# Patient Record
Sex: Male | Born: 1935 | Race: Black or African American | Hispanic: No | Smoking: Former smoker
Health system: Southern US, Community
[De-identification: ages and names within clinical notes are randomized; demographics above are authoritative.]

## PROBLEM LIST (undated history)

## (undated) ENCOUNTER — Emergency Department (HOSPITAL_COMMUNITY): Discharge status: Absent without leave | Payer: Medicare HMO

## (undated) DIAGNOSIS — I4891 Unspecified atrial fibrillation: Secondary | ICD-10-CM

## (undated) DIAGNOSIS — Z9289 Personal history of other medical treatment: Secondary | ICD-10-CM

## (undated) DIAGNOSIS — I2510 Atherosclerotic heart disease of native coronary artery without angina pectoris: Secondary | ICD-10-CM

## (undated) DIAGNOSIS — E119 Type 2 diabetes mellitus without complications: Secondary | ICD-10-CM

## (undated) DIAGNOSIS — I10 Essential (primary) hypertension: Secondary | ICD-10-CM

## (undated) DIAGNOSIS — K56609 Unspecified intestinal obstruction, unspecified as to partial versus complete obstruction: Secondary | ICD-10-CM

## (undated) DIAGNOSIS — Y92009 Unspecified place in unspecified non-institutional (private) residence as the place of occurrence of the external cause: Secondary | ICD-10-CM

## (undated) DIAGNOSIS — W19XXXA Unspecified fall, initial encounter: Secondary | ICD-10-CM

## (undated) DIAGNOSIS — E785 Hyperlipidemia, unspecified: Secondary | ICD-10-CM

## (undated) DIAGNOSIS — I739 Peripheral vascular disease, unspecified: Secondary | ICD-10-CM

## (undated) DIAGNOSIS — IMO0002 Reserved for concepts with insufficient information to code with codable children: Secondary | ICD-10-CM

## (undated) DIAGNOSIS — K279 Peptic ulcer, site unspecified, unspecified as acute or chronic, without hemorrhage or perforation: Secondary | ICD-10-CM

## (undated) DIAGNOSIS — I219 Acute myocardial infarction, unspecified: Secondary | ICD-10-CM

## (undated) HISTORY — DX: Unspecified atrial fibrillation: I48.91

## (undated) HISTORY — DX: Personal history of other medical treatment: Z92.89

## (undated) HISTORY — DX: Peripheral vascular disease, unspecified: I73.9

## (undated) HISTORY — DX: Reserved for concepts with insufficient information to code with codable children: IMO0002

## (undated) HISTORY — DX: Peptic ulcer, site unspecified, unspecified as acute or chronic, without hemorrhage or perforation: K27.9

## (undated) HISTORY — DX: Acute myocardial infarction, unspecified: I21.9

## (undated) HISTORY — PX: ILIAC ARTERY ANEURYSM REPAIR: SUR1158

## (undated) HISTORY — DX: Essential (primary) hypertension: I10

## (undated) HISTORY — DX: Hyperlipidemia, unspecified: E78.5

## (undated) HISTORY — PX: GASTRECTOMY: SHX58

## (undated) HISTORY — DX: Type 2 diabetes mellitus without complications: E11.9

## (undated) HISTORY — PX: JOINT REPLACEMENT: SHX530

## (undated) HISTORY — PX: EYE SURGERY: SHX253

## (undated) HISTORY — DX: Atherosclerotic heart disease of native coronary artery without angina pectoris: I25.10

## (undated) HISTORY — DX: Unspecified intestinal obstruction, unspecified as to partial versus complete obstruction: K56.609

---

## 1985-10-25 HISTORY — PX: CARDIAC CATHETERIZATION: SHX172

## 2002-06-06 ENCOUNTER — Encounter: Payer: Self-pay | Admitting: Internal Medicine

## 2002-06-06 ENCOUNTER — Ambulatory Visit (HOSPITAL_COMMUNITY): Admission: RE | Admit: 2002-06-06 | Discharge: 2002-06-06 | Payer: Self-pay | Admitting: Internal Medicine

## 2003-03-12 ENCOUNTER — Encounter: Payer: Self-pay | Admitting: Vascular Surgery

## 2003-03-12 ENCOUNTER — Encounter: Admission: RE | Admit: 2003-03-12 | Discharge: 2003-03-12 | Payer: Self-pay | Admitting: Vascular Surgery

## 2003-03-26 HISTORY — PX: CORONARY ANGIOPLASTY WITH STENT PLACEMENT: SHX49

## 2003-03-29 ENCOUNTER — Encounter: Payer: Self-pay | Admitting: Cardiology

## 2003-03-29 ENCOUNTER — Ambulatory Visit (HOSPITAL_COMMUNITY): Admission: RE | Admit: 2003-03-29 | Discharge: 2003-03-30 | Payer: Self-pay | Admitting: Cardiology

## 2003-05-14 ENCOUNTER — Encounter: Payer: Self-pay | Admitting: Orthopedic Surgery

## 2003-05-22 ENCOUNTER — Encounter: Payer: Self-pay | Admitting: Orthopedic Surgery

## 2003-05-22 ENCOUNTER — Inpatient Hospital Stay (HOSPITAL_COMMUNITY): Admission: RE | Admit: 2003-05-22 | Discharge: 2003-05-26 | Payer: Self-pay | Admitting: Orthopedic Surgery

## 2004-02-25 ENCOUNTER — Ambulatory Visit (HOSPITAL_COMMUNITY): Admission: RE | Admit: 2004-02-25 | Discharge: 2004-02-25 | Payer: Self-pay | Admitting: Internal Medicine

## 2004-03-17 ENCOUNTER — Encounter: Admission: RE | Admit: 2004-03-17 | Discharge: 2004-03-17 | Payer: Self-pay | Admitting: Vascular Surgery

## 2004-09-07 ENCOUNTER — Ambulatory Visit: Payer: Self-pay | Admitting: Family Medicine

## 2004-09-22 ENCOUNTER — Ambulatory Visit (HOSPITAL_COMMUNITY): Admission: RE | Admit: 2004-09-22 | Discharge: 2004-09-22 | Payer: Self-pay | Admitting: Gastroenterology

## 2004-10-14 ENCOUNTER — Ambulatory Visit (HOSPITAL_COMMUNITY): Admission: RE | Admit: 2004-10-14 | Discharge: 2004-10-14 | Payer: Self-pay | Admitting: Specialist

## 2004-12-29 ENCOUNTER — Emergency Department (HOSPITAL_COMMUNITY): Admission: EM | Admit: 2004-12-29 | Discharge: 2004-12-29 | Payer: Self-pay | Admitting: Emergency Medicine

## 2005-01-18 ENCOUNTER — Ambulatory Visit: Payer: Self-pay | Admitting: Family Medicine

## 2005-04-06 ENCOUNTER — Encounter: Admission: RE | Admit: 2005-04-06 | Discharge: 2005-04-06 | Payer: Self-pay | Admitting: Vascular Surgery

## 2005-04-14 ENCOUNTER — Ambulatory Visit: Payer: Self-pay | Admitting: Family Medicine

## 2005-04-19 ENCOUNTER — Ambulatory Visit: Payer: Self-pay | Admitting: Family Medicine

## 2005-09-09 ENCOUNTER — Inpatient Hospital Stay (HOSPITAL_COMMUNITY): Admission: EM | Admit: 2005-09-09 | Discharge: 2005-09-14 | Payer: Self-pay | Admitting: Emergency Medicine

## 2005-09-20 ENCOUNTER — Ambulatory Visit: Payer: Self-pay | Admitting: Family Medicine

## 2006-02-11 ENCOUNTER — Ambulatory Visit: Payer: Self-pay | Admitting: Family Medicine

## 2006-04-05 ENCOUNTER — Encounter: Admission: RE | Admit: 2006-04-05 | Discharge: 2006-04-05 | Payer: Self-pay | Admitting: Vascular Surgery

## 2006-04-11 ENCOUNTER — Ambulatory Visit: Payer: Self-pay | Admitting: Internal Medicine

## 2006-09-07 ENCOUNTER — Ambulatory Visit: Payer: Self-pay | Admitting: Internal Medicine

## 2007-01-23 ENCOUNTER — Ambulatory Visit: Payer: Self-pay | Admitting: Internal Medicine

## 2007-04-09 ENCOUNTER — Emergency Department (HOSPITAL_COMMUNITY): Admission: EM | Admit: 2007-04-09 | Discharge: 2007-04-10 | Payer: Self-pay | Admitting: Emergency Medicine

## 2007-04-11 ENCOUNTER — Encounter: Admission: RE | Admit: 2007-04-11 | Discharge: 2007-04-11 | Payer: Self-pay | Admitting: Vascular Surgery

## 2007-04-11 ENCOUNTER — Ambulatory Visit: Payer: Self-pay | Admitting: Vascular Surgery

## 2007-10-02 ENCOUNTER — Encounter (INDEPENDENT_AMBULATORY_CARE_PROVIDER_SITE_OTHER): Payer: Self-pay | Admitting: Family Medicine

## 2007-10-02 ENCOUNTER — Ambulatory Visit: Payer: Self-pay | Admitting: Internal Medicine

## 2007-10-02 LAB — CONVERTED CEMR LAB
AST: 17 units/L (ref 0–37)
Albumin: 4.6 g/dL (ref 3.5–5.2)
BUN: 11 mg/dL (ref 6–23)
Basophils Relative: 1 % (ref 0–1)
Calcium: 9.7 mg/dL (ref 8.4–10.5)
Chloride: 103 meq/L (ref 96–112)
HDL: 62 mg/dL (ref >39)
Lymphocytes Relative: 33 % (ref 12–46)
Lymphs Abs: 1.8 10*3/uL (ref 0.7–4.0)
MCHC: 33.2 g/dL (ref 30.0–36.0)
Monocytes Relative: 9 % (ref 3–12)
Neutro Abs: 3.1 10*3/uL (ref 1.7–7.7)
Neutrophils Relative %: 56 % (ref 43–77)
PSA: 0.57 ng/mL (ref 0.10–4.00)
Potassium: 4.8 meq/L (ref 3.5–5.3)
RBC: 4.81 M/uL (ref 4.22–5.81)
WBC: 5.6 10*3/uL (ref 4.0–10.5)

## 2007-11-01 ENCOUNTER — Encounter: Admission: RE | Admit: 2007-11-01 | Discharge: 2008-01-30 | Payer: Self-pay | Admitting: Family Medicine

## 2008-01-16 ENCOUNTER — Encounter: Admission: RE | Admit: 2008-01-16 | Discharge: 2008-01-16 | Payer: Self-pay | Admitting: Vascular Surgery

## 2008-01-16 ENCOUNTER — Ambulatory Visit: Payer: Self-pay | Admitting: Vascular Surgery

## 2008-01-30 ENCOUNTER — Ambulatory Visit: Payer: Self-pay | Admitting: Vascular Surgery

## 2008-02-20 ENCOUNTER — Ambulatory Visit: Payer: Self-pay | Admitting: Surgery

## 2008-02-20 ENCOUNTER — Ambulatory Visit (HOSPITAL_COMMUNITY): Admission: RE | Admit: 2008-02-20 | Discharge: 2008-02-20 | Payer: Self-pay | Admitting: Surgery

## 2008-02-22 ENCOUNTER — Inpatient Hospital Stay (HOSPITAL_COMMUNITY): Admission: RE | Admit: 2008-02-22 | Discharge: 2008-02-25 | Payer: Self-pay | Admitting: Vascular Surgery

## 2008-03-26 ENCOUNTER — Encounter: Admission: RE | Admit: 2008-03-26 | Discharge: 2008-03-26 | Payer: Self-pay | Admitting: Vascular Surgery

## 2008-03-26 ENCOUNTER — Ambulatory Visit: Payer: Self-pay | Admitting: Vascular Surgery

## 2008-05-20 ENCOUNTER — Ambulatory Visit: Payer: Self-pay | Admitting: *Deleted

## 2008-10-08 ENCOUNTER — Ambulatory Visit: Payer: Self-pay | Admitting: Vascular Surgery

## 2008-10-08 ENCOUNTER — Encounter: Admission: RE | Admit: 2008-10-08 | Discharge: 2008-10-08 | Payer: Self-pay | Admitting: Vascular Surgery

## 2008-12-02 ENCOUNTER — Emergency Department (HOSPITAL_COMMUNITY): Admission: EM | Admit: 2008-12-02 | Discharge: 2008-12-02 | Payer: Self-pay | Admitting: Emergency Medicine

## 2008-12-04 ENCOUNTER — Emergency Department (HOSPITAL_COMMUNITY): Admission: EM | Admit: 2008-12-04 | Discharge: 2008-12-04 | Payer: Self-pay | Admitting: Family Medicine

## 2009-04-18 ENCOUNTER — Ambulatory Visit: Payer: Self-pay | Admitting: Vascular Surgery

## 2009-11-25 HISTORY — PX: TRANSTHORACIC ECHOCARDIOGRAM: SHX275

## 2010-05-12 ENCOUNTER — Ambulatory Visit: Payer: Self-pay | Admitting: Vascular Surgery

## 2011-02-09 LAB — DIFFERENTIAL
Eosinophils Relative: 3 % (ref 0–5)
Lymphocytes Relative: 20 % (ref 12–46)
Lymphs Abs: 0.9 10*3/uL (ref 0.7–4.0)
Monocytes Absolute: 0.5 10*3/uL (ref 0.1–1.0)

## 2011-02-09 LAB — CBC
HCT: 32.9 % — ABNORMAL LOW (ref 39.0–52.0)
Hemoglobin: 10.8 g/dL — ABNORMAL LOW (ref 13.0–17.0)
RDW: 15.4 % (ref 11.5–15.5)
WBC: 4.3 10*3/uL (ref 4.0–10.5)

## 2011-03-09 NOTE — Procedures (Signed)
ENDOVASCULAR STENT GRAFT EXAM   INDICATION:  Followup evaluation of endovascular stent.   HISTORY:  Endovascular aneurysm repair on 02/22/2008 by Dr. Myra Gianotti.                           DUPLEX EVALUATION   AAA Sac Size:                 2.75 CM AP            2.60 CM TRV  Previous Sac Size:            CM AP                 CM TRV  Evidence of an endoleak?      No                    No   Velocity Criteria:  Proximal Aorta                72 cm/sec  Proximal Stent Graft          61 cm/sec  Main Body Stent Graft-Mid     51 cm/sec  Right Limb-Proximal           135 cm/sec  Right Limb-Distal             152 cm/sec  Left Limb-Proximal            140 cm/sec  Left Limb-Distal              68 cm/sec  Patent Renal Arteries?        Yes                   Yes   IMPRESSION:  1. Patent endovascular stent with no evidence of stenosis or endoleak.  2. Biphasic duplex waveform noted within stent and native arteries.  3. Normal bilateral lower extremity ABIs with triphasic and biphasic      Doppler waveforms.   ___________________________________________  Quita Skye Hart [place], M.D.   AC/MEDQ  D:  04/18/2009  T:  04/18/2009  Job:  [id number]

## 2011-03-09 NOTE — Assessment & Plan Note (Signed)
OFFICE VISIT   TAJAI, IHDE  DOB:  1932-03-08                                       03/26/2008  TDDUK#:[id number]   The patient returns 4 weeks post insertion of an aortobi-iliac stent  graft to treat the right common iliac artery aneurysm.  He had coil  embolization of the right internal iliac artery preoperatively by Dr.  Myra Gianotti.  Cheree Ditto excluder graft was utilized.  The patient complains of  no discomfort in the inguinal areas or in the abdomen and has resumed  his normal activities with a good appetite.   PHYSICAL EXAMINATION:  Vital signs:  Blood pressure 110/70, heart rate  60, respirations 14.  Abdominal:  Exam is unremarkable with no pulsatile  mass noted.  Incisions in the inguinal areas are well-healed with 3+  femoral pulses and well-perfused lower extremities.   CT angiogram was performed today which I reviewed.  There was no  evidence of any endoleak or migration of the graft and the right common  iliac artery aneurysm is thrombosed with patency of both limbs of the  graft.  He was reassured regarding these findings and will return in 6  months for a followup CT angiogram and then to see me.   Quita Skye Hart [place], M.D.  Electronically Signed   JDL/MEDQ  D:  03/26/2008  T:  03/27/2008  Job:  [id number]

## 2011-03-09 NOTE — Procedures (Signed)
LOWER EXTREMITY ARTERIAL EVALUATION-SINGLE LEVEL   INDICATION:  Preop evaluation of AAA repair.   HISTORY:  Diabetes:  No.  Cardiac:  History of MI, cardiac stent.  Hypertension:  Yes.  Smoking:  No.  Previous Surgery:  Cardiac.   RESTING SYSTOLIC PRESSURES: (ABI)                          RIGHT                LEFT  Brachial:               128                  128  Anterior tibial:        134 (>1.0)           130 (>1.0)  Posterior tibial:       140                  130  Peroneal:  DOPPLER WAVEFORM ANALYSIS:  Anterior tibial:        Biphasic             Triphasic  Posterior tibial:       Triphasic            Triphasic  Peroneal:   PREVIOUS ABI'S:  Date:  RIGHT:  LEFT:   IMPRESSION:  Bilateral ankle brachial indices within normal limits.   ___________________________________________  Quita Skye Hart [place], M.D.   PB/MEDQ  D:  01/30/2008  T:  01/30/2008  Job:  [id number]

## 2011-03-09 NOTE — Assessment & Plan Note (Signed)
OFFICE VISIT   RHYLEN, SHAHEEN  DOB:  15-Nov-1931                                       01/30/2008  ZOXWR#:[id number]   The patient returns today to discuss options to treat his 4 cm right  iliac aneurysm.  I reviewed his CT angiogram with Dr. Myra Gianotti and with  Trena Platt from [place] [facility] and we feel that the best option for this is  insertion of an aortobi-iliac stent graft.  He does have a large right  common iliac aneurysm measuring 4 cm in diameter which extends down to  the origin of the internal iliac which will need embolization.  I  discussed this with him and his wife today and explained the procedure  in detail.   On examination today his blood pressure is 135/80, heart rate 55,  respirations 16.  His abdomen is soft and nontender with a pulsatile  mass in the right lower quadrant.  He has excellent femoral pulses  bilaterally.   We will proceed with embolization of his right internal iliac artery by  Dr. Myra Gianotti on April 28 with admission to the hospital that day and  insertion of the aortobi-iliac stent graft on Wednesday April 29.  He  will obtain a Cardiolite study preoperatively at [facility].  He underwent PTCA and stenting by Dr. Elsie Lincoln many years ago  but is asymptomatic.   Quita Skye Hart [place], M.D.  Electronically Signed   JDL/MEDQ  D:  01/30/2008  T:  01/31/2008  Job:  [id number]

## 2011-03-09 NOTE — Op Note (Signed)
[name], Robert                 ACCOUNT NO.:  [id number]   MEDICAL RECORD NO.:  [id number]          PATIENT TYPE:  INP   LOCATION:  2007                         FACILITY:  [facility]   PHYSICIAN:  Juleen China IV, MDDATE OF BIRTH:  15-Jun-1932   DATE OF PROCEDURE:  02/22/2008  DATE OF DISCHARGE:  02/20/2008                               OPERATIVE REPORT   PREOPERATIVE DIAGNOSIS:  Right common iliac aneurysm.   SURGEON:  1. Durene Cal IV, MD   PROCEDURE PERFORMED:  Endovascular aneurysm repair.   This is a dictation for the left common femoral artery exposure.  Please  see Dr. Candie Chroman note for full details of the procedure.   TYPE OF ANESTHESIA:  General.   ESTIMATED BLOOD LOSS:  150 milliliters.   COMPLICATIONS:  None.   PROCEDURE:  The patient was identified in the holding area and taken to  room #9, and he was placed supine on the table.  General endotracheal  anesthesia was administered.  The patient was prepped and draped in the  standard sterile fashion.  A time-out was called and antibiotics were  given.   The inguinal ligament was identified by bony landmarks.  An oblique  incision was made below the inguinal ligament above the palpable pulse  of the common femoral artery.  Bovie cautery was used to dissect the  subcutaneous tissue.  Weitlaner retractors were used for exposure.  The  femoral sheath was identified and opened sharply.  Common femoral artery  was then mobilized proximally and distally.  Vesseloops were then  placed.   Once the procedure had been completed, sheaths and wires were removed.  The common femoral artery was flushed in antegrade and retrograde  fashion.  The arteriotomy was then closed with running 5-0 Prolene.  Doppler was used to evaluate the artery and had good signals.   After irrigation, the femoral sheath was closed with a running 2-0  Vicryl.  Subcutaneous tissue was closed in two additional layers of 2-0  Vicryl, and the skin  was closed with 4-0 Vicryl.           ______________________________  V. Charlena Cross, MD  Electronically Signed    VWB/MEDQ  D:  02/23/2008  T:  02/24/2008  Job:  [id number]

## 2011-03-09 NOTE — Assessment & Plan Note (Signed)
OFFICE VISIT   Robert, [name]  DOB:  07/19/1932                                       10/08/2008  VFIEP#:[id number]   The patient returns today for further followup regarding his stent graft  which I inserted in April of 2009 for a large right iliac aneurysm.  CT  angiogram was performed today prior to me seeing him which I have  reviewed.  This reveals that the stent graft is in excellent position  and the thrombosed right common iliac artery aneurysm is stable.  There  is no evidence of any endoleak or migration of the graft on scan today.  He denies any abdominal or back symptoms.  He is able to ambulate about  1 mile without claudication symptoms.  He has had no chest pain, dyspnea  on exertion, PND or orthopnea.  He denies any neurologic symptoms such  as hemiparesis, aphasia, amaurosis fugax, diplopia, blurred vision or  syncope.  He is not taking aspirin because he is allergic to it.   PHYSICAL EXAMINATION:  Blood pressure is 125/78, heart rate is 57,  respirations are 18.  Carotid pulses 3+ and no bruits.  Chest clear to  auscultation.  Abdomen soft, nontender with no pulsatile mass.  He has  3+ femoral, popliteal and dorsalis pedis pulses palpable bilaterally.   I am pleased with the results of his scan and his progress.  We will see  him again in 6 months and obtain a duplex scan in our office for  continued followup of the aortic stent graft to treat the right iliac  aneurysm.  If he has any problems in the interim he will be in touch  with [place].   Quita Skye Hart [place], M.D.  Electronically Signed   JDL/MEDQ  D:  10/08/2008  T:  10/09/2008  Job:  [id number]   cc:   Thora Lance, M.D.  Courtney Paris, M.D.

## 2011-03-09 NOTE — Op Note (Signed)
NAMEJLON, BETKER                 ACCOUNT NO.:  [id number]   MEDICAL RECORD NO.:  [id number]          PATIENT TYPE:  AMB   LOCATION:  SDS                          FACILITY:  [facility]   PHYSICIAN:  Juleen China IV, MDDATE OF BIRTH:  May 26, 1932   DATE OF PROCEDURE:  02/20/2008  DATE OF DISCHARGE:  02/20/2008                               OPERATIVE REPORT   PREOPERATIVE DIAGNOSIS:  Right common iliac aneurysm.   POSTOPERATIVE DIAGNOSIS:  Right common iliac aneurysm.   PROCEDURE PERFORMED:  1. Ultrasound-guided access to the left common femoral artery.  2. Abdominal aortogram.  3. Second-order catheterization.  4. Coil embolization of right hypogastric artery.  5. Conscious sedation from 11:42 to 12:28.   PROCEDURE:  The patient was identified in the holding area and taken to  the room 8, and he was placed supine on the table.  Bilateral groins  were prepped and draped in a standard sterile fashion.  A timeout was  called.  The left common femoral artery was evaluated with ultrasound  and found to be widely patent.  Lidocaine 1% was used for local  anesthesia.  Using ultrasound, the left common femoral artery was  accessed under ultrasound guidance.  A 0.035 wire was advanced in the  retrograde fashion into the abdominal aorta under fluoroscopic  visualization.  A 5-French sheath was then placed over the wire, and a  marked pigtail catheter was placed at the L1 and abdominal aortogram was  obtained.  Oblique images were then obtained of the iliac arterial  system.   FINDINGS:  Aortogram:  Single renal arteries were visualized which were  widely patent with minimal stenosis.  There is diffuse disease  throughout the intrarenal abdominal aorta.  Iliac and infrarenal  abdominal aorta are heavily calcified.  Left common iliac artery is  widely patent.  The left hypogastric artery is widely patent.  The left  external iliac artery is patent with mild disease.  The right common  iliac  artery was aneurysmal.  The right external iliac artery is patent.  The right hypogastric artery was patent.   Intervention:  Using a crossing catheter, the aortic bifurcation was  crossed.  A wire was placed into the right external iliac artery, and a  6-french Terumo sheath was then advanced over the bifurcation into the  right external iliac artery.  Using a Kumpe catheter, the sheath was  pulled back, and the right hypogastric artery was selectively  cannulated.  A contrast injection was performed to confirm successful  cannulation.  The tip of the catheter was placed into the hypogastric  artery.  Next, two 10 x 14 Nester coils were placed in the right  hypogastric artery.  A followup angiogram was performed which reveals  successful coil embolization of the right hypogastric artery.   The above results were obtained.  A decision was made to terminate the  procedure.  Catheters and wires were removed.  The patient will be taken  to the holding area for sheath pull.  The patient tolerated the  procedure.  There are no complications.  ______________________________  V. Charlena Cross, MD  Electronically Signed     VWB/MEDQ  D:  02/20/2008  T:  02/21/2008  Job:  [id number]

## 2011-03-09 NOTE — Op Note (Signed)
NAMEHULON, FERRON                 ACCOUNT NO.:  [id number]   MEDICAL RECORD NO.:  [id number]          PATIENT TYPE:  INP   LOCATION:  3307                         FACILITY:  [facility]   PHYSICIAN:  Quita Skye. Hart [place], M.D.  DATE OF BIRTH:  1932-09-05   DATE OF PROCEDURE:  02/22/2008  DATE OF DISCHARGE:                               OPERATIVE REPORT   PREOPERATIVE DIAGNOSIS:  Right common iliac artery aneurysm.   POSTOPERATIVE DIAGNOSIS:  Right common iliac artery aneurysm.   PROCEDURE:  1. Bilateral common femoral artery exposure (right side Dr. Hart Rochester,      left side Dr. Juleen China).  2. Insertion of an aorto to right external iliac and left common iliac      artery, excluder stent graft Emeline Darling) with;      a.     The main body 23 x 12 x 18 cm - right side.      b.     Contralateral limb is 20 x 11.5 cm - left side.   SURGEON:  Quita Skye. Hart [place], MD   FIRST ASSISTANT:  1. Durene Cal IV, MD   ANESTHESIA:  General endotracheal.   PROCEDURE:  The patient was taken to the operating room and placed in  the supine position, at which time satisfactory general endotracheal  anesthesia was administered.  Monitoring lines were placed by anesthesia  preoperatively.  Abdomen and both groins were prepped.  Betadine scrub  and solution, draped in routine sterile manner.  Oblique suprainguinal  incisions were made bilaterally and the common femoral arteries were  exposed and encircled with vessel loops.  They were slightly dilated but  not truly aneurysmal and had some mild occlusive disease.  Heparin 6000  units was given intravenously.  8-French sheaths were inserted  bilaterally over guidewires in the common femoral arteries.  Dr. Hart Rochester  exposed the right side, Dr. Durene Cal exposed the left side.  Pigtail catheter was positioned in the suprarenal aorta from the left  side, and the main body which was deployed through the right femoral  artery was positioned in the juxtarenal  aorta.  The main body was a 23 x  12 x 18 cm device - excluder [employer]).  An angiogram was then performed  through the pigtail catheter using 20 mL of contrast at 20 mL/sec.  The  renal arteries were marked appropriately and the device positioned just  distal to the renal arteries, device deployed.  Retrograde angiogram on  the right side was performed through the sheath.  The right internal  iliac artery had been embolized preoperatively with the aneurysm being  located in the right common iliac artery.  The right limb through the  main body extended well into the right external iliac artery and  retrograde iliac angiogram on the right side revealed this to be in good  position.  Following this, the contralateral gate was cannulated through  the left common femoral exposure without difficulty and the length of  the left limb was measured appropriately and bell bottom 20 mm - 11.5 cm  limb was deployed.  This extended down the common iliac artery on the  left which was mildly aneurysmal and terminated about 3 cm above the  origin of the left hypogastric.  All of the junctions were then gently  dilated with Coda balloon and completion angiogram was performed through  the pigtail catheter.  This revealed the aortobiiliac graft to be in  excellent position with no evidence of any Endo leaks.  Sheaths were all  removed and protamine given to reverse the heparin.  Prior to the  administration of protamine, both common femoral arteries were repaired  with continuous 6-0 Prolene sutures.  There was excellent Doppler flow.  Protamine  given to reverse the heparin following adequate hemostasis.  The wounds  were closed in layers with Vicryl in subcuticular fashion.  Sterile  dressing was applied.  The patient was taken to recovery room in  satisfactory condition.      Quita Skye Hart Rochester, M.D.  Electronically Signed     JDL/MEDQ  D:  02/22/2008  T:  02/23/2008  Job:  [id number]

## 2011-03-09 NOTE — H&P (Signed)
HISTORY AND PHYSICAL EXAMINATION   January 30, 2008   Re:  Robert [name], Robert [name]                   DOB:  Aug 09, 1932   CHIEF COMPLAINT:  Large right common iliac artery aneurysm.   HISTORY OF PRESENT ILLNESS:  This 77 year old male patient has had a  large right common iliac aneurysm followed by Dr. Hart Rochester since 2008.  This has increased in size and CT scanning recently revealed that the  right common iliac artery now measures 4 cm in diameter.  He has some  mild dilatation of his abdominal aorta and his left common iliac artery  but not truly aneurysmal.  After thorough preoperative evaluation he is  now scheduled for embolization of the right internal iliac artery to be  followed by insertion of an aortobi-iliac stent graft by Dr. Hart Rochester and  Dr. Myra Gianotti.   PAST MEDICAL HISTORY:  1. Hypertension.  2. Coronary artery disease with previous myocardial infarction many      years ago with recent Cardiolite study performed.  3. Hyperlipidemia.  4. Negative for diabetes, congestive heart failure, COPD.   PAST SURGICAL HISTORY:  1. Gastrectomy 50 years ago for ulcer disease - 2 operations.  2. Left hip replacement.  3. Cataract surgery, right eye.  4. Previous PTCA and stenting 20 years ago for coronary artery disease      - Dr. Elsie Lincoln.   FAMILY HISTORY:  Positive for CVA in his father, negative for coronary  artery disease or diabetes.   SOCIAL HISTORY:  He is widowed, disabled and not employed.  He smoked  about 1/2 pack of cigarettes per day until around 2000 when he quit.  Does not use alcohol on a regular basis.   REVIEW OF SYSTEMS:  Occasional dyspnea but no palpitations or PND or  orthopnea.  No weight loss or weight gain.  Negative for pulmonary  symptoms.  Acid reflux esophagitis.  No urinary or neurologic symptoms.   ALLERGIES:  None known.   MEDICATIONS:  1. Norvasc 5 mg one tablet daily.  2. Zocor 20 mg one tablet daily.  3. Fosamax one every week.  4.  Alphagan eye drops 2 times a day.   PHYSICAL EXAMINATION:  Vital signs:  Blood pressure is 135/80, heart  rate is 55, respirations 16.  General:  He is an elderly male in no  apparent distress.  Alert and oriented x3.  Neck:  His neck is supple.  3+ carotid pulses palpable.  No bruits are audible.  Neurological:  Normal.  No palpable adenopathy in the neck.  Upper extremities:  Pulses  3+ bilaterally.  Chest:  Clear to auscultation.  Cardiovascular:  Reveals a regular rhythm with no murmurs.  Abdomen:  Soft, nontender.  Pulsatile mass in the right lower quadrant approximating 4 cm.  He has  3+ femoral pulses bilaterally with well-perfused lower extremities.   IMPRESSION:  1. Large right common iliac artery aneurysm which has gradually      enlarged since 2003.  2. Coronary artery disease stable for preoperative clearance with      Cardiolite study.  3. Hypertension.  4. Hyperlipidemia.   PLAN:  Admit the patient on April 28 for embolization of his right  internal iliac artery by Dr. Durene Cal to be followed by insertion  of aortoiliac stent graft by Dr. Hart Rochester and Dr. Myra Gianotti on Wednesday,  April 29.  The risks and benefits have been thoroughly discussed with  the patient.  They would like to proceed.   Quita Skye Hart Rochester, M.D.  Electronically Signed   JDL/MEDQ  D:  01/30/2008  T:  01/31/2008  Job:  [id number]   cc:   Thora Lance, M.D.

## 2011-03-09 NOTE — Procedures (Signed)
ENDOVASCULAR STENT GRAFT EXAM   INDICATION:  Follow up endovascular stent.   HISTORY:  Endovascular aneurysm repair of the right common iliac artery aneurysm.                           DUPLEX EVALUATION   AAA Sac Size:                 CM AP                 CM TRV  Previous Sac Size:            CM AP                 CM TRV  Evidence of an endoleak?      No                    No   Velocity Criteria:  Proximal Aorta                50 cm/sec  Proximal Stent Graft          30 cm/sec  Main Body Stent Graft-Mid     83 cm/sec  Right Limb-Proximal           66 cm/sec  Right Limb-Distal             65 cm/sec  Left Limb-Proximal            43 cm/sec  Left Limb-Distal              39 cm/sec  Patent Renal Arteries?        Yes                   Yes   IMPRESSION:  1. Patent endovascular stent with no evidence of stenosis or leak,      based on limited visualization due to overlying bowel gas patterns.  2. Maximum diameter of the visualization right common iliac artery was      2.0 X 2.0 cm.  3. Normal ankle brachial indices of 0.99 on the right and 1.02 on the      left.  4. Incidental finding:  There is a large cystic structure measuring      7.5 X 5.2 X 6.5 cm noted near the left kidney with no internal flow      noted.   ___________________________________________  Quita Skye Hart Rochester, M.D.   CH/MEDQ  D:  05/13/2010  T:  05/13/2010  Job:  [id number]

## 2011-03-09 NOTE — Assessment & Plan Note (Signed)
OFFICE VISIT   BENZ, VANDENBERGHE  DOB:  1932-02-03                                       01/16/2008  QIONG#:[id number]   The patient returns today for further followup of his right iliac artery  aneurysm, which we have been following since 2002.  Aneurysm has  gradually enlarged from around 3 cm in diameter to close to 4 cm in  diameter today (3.9.  This is a slight enlargement over last year's  study.  He does not have significant abdominal aortic aneurysm, but does  have some mild aneurysm and dilatation of his left common iliac artery.  He has had no abdominal or back symptoms, and does not have any cardiac  symptoms, such as chest pain, dyspnea on exertion, PND, or orthopnea.   EXAMINATION:  His abdomen is soft and nontender with a pulsatile mass in  the right lower quadrant, approximating 4 cm.  He has 3+ femoral pulses  bilaterally with well-perfused lower extremities.  Upper extremity  pulses are 3+ bilaterally.  His carotid pulses are 3+ with no bruits.  Chest is clear to auscultation.  Cardiovascular exam reveals a regular  rate and rhythm without murmurs.   He had a CT angiogram today with pre-stent graft measurements.  Maximum  diameter of the right iliac artery is 39 mm.  The diameter of the right  common iliac artery at the bifurcation is approximately 18 mm and there  may be as much as 15 mm of length above the aneurysm to seed a stent  graft.  This would also require embolization of internal iliac artery.  I discussed this possibility with the patient and the fact that the  aneurysm is slowly enlarging.  At this point, we will continue to follow  it for now.  I will see him back in 9 months with another CT angiogram  and will discuss this further with Dr. Myra Gianotti to get his opinion  regarding possible stent grafting of this iliac aneurysm.  If the  patient has any symptoms, he is to be in touch with [place].   Quita Skye Hart Rochester, M.D.  Electronically  Signed   JDL/MEDQ  D:  01/16/2008  T:  01/17/2008  Job:  [id number]

## 2011-03-09 NOTE — Assessment & Plan Note (Signed)
OFFICE VISIT   [name], [name]  DOB:  14-Apr-1932                                       04/11/2007  ZOXWR#:[id number]   Robert [name] returns for further follow up of his right iliac aneurysm  which we have been following since 2002.  The aneurysm initially  measured about 3 cm in diameter and has slowly enlarged and a CT scan  today was reviewed by me and currently measures 3.6 cm in diameter.  He  has no significant abdominal aortic aneurysm but does have some mild  aneurysmal dilatation of his left common iliac artery in the 2 cm range.  He is not having any abdominal or back symptoms at the present time.  He  denies any cardiac symptoms currently, although he did have an episode a  few days ago of some strange left anterior chest discomfort and was  evaluated in the emergency room and these symptoms were not felt to be  cardiac in nature.  He has had a previous PTCA and stent done by Dr.  Elsie Lincoln in the past.  Denies any neurologic symptoms as well including  hemiparesis, aphasia, amaurosis fugax, blurred vision or syncope.   PHYSICAL EXAMINATION:  Blood pressure 126/73.  Heart rate is 89.  Respirations 16.  His carotid pulse is 3+.  No audible bruits.  Abdomen  is soft, nontender with no masses.  He has 3+ femoral and popliteal  pulses bilaterally with well perfused lower extremities.  There is a  pulsatile mass in the right lower quadrant approximating 3.5 to 4 cm in  diameter, which is nontender.   I have discussed with Robert [name] the fact that this aneurysm is slowly  enlarging.  We will see him back in nine months for repeat CT angiogram.  We will consider catheter angiography at  that time to see if he could be a candidate for a stent graft which will  require embolization of his right internal iliac artery.  If he has any  symptoms in the interim, he will be in touch soon.   Quita Skye Hart Rochester, M.D.  Electronically Signed   JDL/MEDQ  D:   04/11/2007  T:  04/12/2007  Job:  [id number]   cc:   Thora Lance, M.D.

## 2011-03-09 NOTE — Discharge Summary (Signed)
[name], [name]                 ACCOUNT NO.:  [id number]   MEDICAL RECORD NO.:  [id number]          PATIENT TYPE:  INP   LOCATION:  2007                         FACILITY:  [facility]   PHYSICIAN:  Quita Skye. Hart Rochester, M.D.  DATE OF BIRTH:  Mar 08, 1932   DATE OF ADMISSION:  02/22/2008  DATE OF DISCHARGE:  02/25/2008                               DISCHARGE SUMMARY   DISCHARGE DIAGNOSES:  1. Right common iliac aneurysm.  2. Hypertension.  3. Coronary artery disease.  4. Dyslipidemia.   PROCEDURE PERFORMED:  Stenting right common iliac artery aneurysm.   __________   DISCHARGE MEDICATIONS:  1. Norvasc 5 mg p.o. daily.  2. Amlodipine 5 mg p.o. daily.  3. Lasix 40 mg p.o. daily.  4. __________ 5 mg p.o. daily.  5. __________ 20 mg p.o. daily.  6. Alphagan eye drops, one drop each eye twice daily.  7. Percocet 5/325 one p.o. q.4 p.r.n. pain #20 were given.   __________ .   DISPOSITION:  He is instructed to clean his wounds with soap and warm  water.  He is to observe for drainage, redness, swelling, pain,  temperature greater than 101 degrees.  He is to see the doctor in the  office in four weeks for CTA of the abdomen and pelvis with  stent graft  protocol and ABI's. The office will call.   BRIEF IDENTIFYING STATEMENT:  For complete details, please refer to the  history and physical.  Briefly, this is a 77 year old gentleman who was  referred to Dr. Hart Rochester for repair of an iliac artery aneurysm.  Dr.  Hart Rochester found him to be a suitable candidate for endovascular stent.  He  was informed of the risks and benefits of the procedure and after  consideration, elected to proceed with surgery.   HOSPITAL COURSE:  Preoperative workup was completed as an outpatient.  He __________ he was returned to the Post Anesthesia Care Unit  extubated.  __________ stepdown unit.  __________ 1 more day.  __________ He  was mobilized with physical therapy.  On the second postoperative day,  he had a  fever to 100.8 degrees and it was elected to __________ for one  more day.  On the following morning, he had no fevers and his white  count was normal.  He was felt stable and was discharged to home.      Wilmon Arms, PA      Quita Skye Hart Rochester, M.D.     Chrystine Oiler  D:  02/25/2008  T:  02/25/2008  Job:  [id number]

## 2011-03-12 NOTE — H&P (Signed)
NAMEJOEVON, HOLLIMAN                          ACCOUNT NO.:  [id number]   MEDICAL RECORD NO.:  [id number]                   PATIENT TYPE:  INP   LOCATION:  0482                                 FACILITY:  [facility]   PHYSICIAN:  Ollen Gross, M.D.                 DATE OF BIRTH:  April 12, 1932   DATE OF ADMISSION:  05/22/2003  DATE OF DISCHARGE:                                HISTORY & PHYSICAL   CHIEF COMPLAINT:  Left hip pain.   HISTORY OF PRESENT ILLNESS:  The patient is a 77 year old male who has been  seen by Dr. Georges Lynch. Gioffre in the past who was recently treated by Dr.  Harvie Junior at [facility] in regards to a painful left hip.  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  Subsequently the patient was sent over to Dr. Ollen Gross for evaluation.  His initial hip surgery was about 30 years ago, and  then he had a total hip performed in 1991 by Dr. Georges Lynch. Gioffre.  He was  noted to have some osteolysis in 2001 and was placed on Fosamax.  He  followed back up in 2002 with no change.  Subsequently he had some marked  worsening of his symptoms and saw Dr. Harvie Junior who discovered the  loosening of the acetabular component.  Mr. Botz states he is in pain all  the time.  He later followed up with Dr. Ollen Gross where x-rays showed a  grossly loose cementless acetabular shell which was tilted beyond vertical  with severe osteolysis present throughout the acetabulum.  It was also noted  at that time the patient had an omniflex stem which appeared to be in good  position, which did not show any evidence of subsidence or loosening.  There  was a noted cyst in the greater trochanter, that did not extend beyond the  proximal portion of the stem.  Due to the significant findings and  possibility of going on to even further problems and a catastrophic outcome,  it was felt the patient would best be served by  undergoing a revision  surgery.  Risks and benefits of the surgery were discussed with the patient  and he elected to proceed with surgery.   ALLERGIES:  No known drug allergies.   CURRENT MEDICATIONS:  1. Norvasc 10 mg daily.  2. Fosamax 70 mg weekly.  3. Zocor daily.  4. Aspirin daily.  5. Hydrochlorot 25 mg 1/2 tablet daily.   PAST MEDICAL HISTORY:  1. Hypertension.  2. Coronary arterial disease.  3. History of ulcers.  4. History of myocardial infarction in 1985.   PAST SURGICAL HISTORY:  1. Stomach surgery in 1961.  2. Cardiac catheterization in 1985 secondary to a heart attack.  3. Second cardiac catheterization in 03/2003 with heart stenting.  4. Open reduction and  internal fixation of the left hip approximately 30     years ago and then subsequent left total hip arthroplasty in 1991.   SOCIAL HISTORY:  Widowed, disabled, nonsmoker, no alcohol, has five  children.  He has four steps to get into the house.   FAMILY HISTORY:  Daughter age 57 with a history of heart disease and  hypertension.  Father deceased in his early 71's with an enlarged heart.   REVIEW OF SYSTEMS:  GENERAL:  No fevers or chills, night sweats.  NEUROLOGIC:  No seizures, syncope, paralysis.  RESPIRATORY:  No shortness of  breath, productive cough, hemoptysis.  CARDIOVASCULAR:  The patient does  have a significant history of having previous MI, coronary arterial disease  and hypertension.  He has had some angina in the past, no angina recently.  His most recent cardiac catheterization was last month, 03/2003.  No chest  pain, angina, orthopnea.  GI:  No nausea, vomiting, diarrhea or  constipation.  GU:  No dysuria, hematuria.  He does have some urinary  frequency.  MUSCULOSKELETAL:  Pertinent to the left hip found on the history  of present illness.   PHYSICAL EXAMINATION:  VITAL SIGNS:  Pulse 60, respirations 12, blood  pressure 118/82.  GENERAL:  The patient is a 77 year old African-American  male, tall, thin-  framed.  He is alert and oriented, cooperative, very pleasant at the time of  the exam.  He appears to be in no acute distress.  HEENT:  Normocephalic and atraumatic.  Pupils are round and reactive.  Oropharynx is clear.  EOMs are intact.  The patient does have upper and  lower dentures.  NECK:  Supple.  No carotid bruits.  CHEST:  Clear to auscultation anterior, posterior chest wounds, no rhonchi,  rales or wheezing noted.  ABDOMEN:  Soft, nontender, bowel sounds present.  RECTAL:  Not done, not pertinent to present illness.  BREASTS:  Not done, not pertinent to present illness.  GENITALIA:  Not done, not pertinent to present illness.  EXTREMITIES:  Significant to the left hip.  The left hip is about 3/4 of an  inch shorter as compared to the right, has painful range of motion, he is  only able to flex about 90 degrees, internal and external rotation of about  20 degrees in both directions, abduction only about 30 degrees.  He does  ambulate with an antalgic gait.   IMPRESSION:  1. Aseptic loosening, left total hip arthroplasty.  2. Hypertension.  3. Coronary arterial disease.  4. History of myocardial infarction.  5. History of ulcers.   PLAN:  The patient is admitted to [facility] and will undergo a  revision of a left total hip replacement arthroplasty.  Surgery will be  performed by Dr. Ollen Gross.     Alexzandrew L. Julien Girt, P.A.              Ollen Gross, M.D.    ALP/MEDQ  D:  05/22/2003  T:  05/22/2003  Job:  [id number]   cc:   Ollen Gross, M.D.  [address]  [place]  [place] [address]  Fax: [phone]   Thora Lance, M.D.  [address]  [place]  [place] [address]  Fax: [phone]   [place], M.D.   Ronald A. Darrelyn Hillock, M.D.  [address]  [place]  [place] [address]  Fax: [phone]

## 2011-03-12 NOTE — H&P (Signed)
Robert [name], Robert [name]              ACCOUNT NO.:  [id number]   MEDICAL RECORD NO.:  [id number]          PATIENT TYPE:  EMS   LOCATION:  MAJO                         FACILITY:  [facility]   PHYSICIAN:  Deirdre Peer. Polite, M.D. DATE OF BIRTH:  [birth year]   DATE OF ADMISSION:  09/08/2005  DATE OF DISCHARGE:                                HISTORY & PHYSICAL   CHIEF COMPLAINT:  Syncope.   HISTORY OF THE PRESENT ILLNESS:  Mr. Robert [name] is a 77 year old gentleman with  a known history of coronary artery disease, hypertension, BPH, and of a  gastric ulcer who was brought to the ED after having a syncopal spell.  According  to the patient he had been bothered by an upper respiratory  infection over the last, which essentially was resolving.  He states that  prior to going to a friend's house he took two Coricidin tablets. While at  his friend's house he began to feel a little woozy and also felt as if he  had some trouble with his vision, as if he could not see.  Shortly  thereafter his friend states that he got clammy and just went limp.  The  friend states the patient was unconscious for approximately three minutes;  911 was called and the patient was transported to [facility] ED for further  evaluation.  There are no medic sheets for review for any details; all  details are essentially taken from the ER physician and the T sheet that is  available.   While in the ED the patient  is alert and oriented and in no apparent  distress.  Vital signs are stable and orthostasis are negative,satting at  84%.  The patient had an EKG that showed some nonspecific T wave  changes  with a questionable prolonged QT.  Need a __________  score for further  evaluation and treatment.  However, the patient is alert and oriented times,  and in no apparent distress.  He denies any fever, chills, or chest pain;  just the acute onset of not feeling well, felling funny in his head, and  complains of having some difficulty  with his vision.  The patient denies  having any syncopal spells in the past.  He denies having any trouble with  any angina.  The patient has been on a new medication,  which he has been  taking for about three weeks for his prostate; he is unaware what the  medicine is, but he  initially was on Flomax.  In addition, as stated, the  patient had taken a couple tablets of Coricidin for his upper respiratory  infection.  Admission is deemed necessary for further evaluation and  treatment.   MEDICATIONS:  The patient's medications on admission include Zocor, Norvasc,  hydrochlorothiazide and a pill that he takes for his prostate for BPH  symptoms.   SOCIAL HISTORY:  The social history is negative for tobacco times 12 years,  no alcohol and no drugs.   PAST SURGICAL HISTORY:  1.  History of left total hip replacement times two.  2.  The patient had a  partial gastrectomy approximately 40 years ago      secondary to gastric ulcers,   ALLERGIES:  The patient reports intolerance to ASPIRIN.   FAMILY HISTORY:  Mother with hypertension.  Father with hypertension and  CHF.   REVIEW OF SYSTEMS:  The review of systems is as stated in the HPI.   PHYSICAL EXAMINATION:  GENERAL APPEARANCE:  In general the patient alert and  oriented  times three, and in no apparent distress.  VITAL SIGNS:  Temp 97,0, BP 117/74 lying, 120/80 sitting, 125/79 standing.  Pulses 65, 70 and 87 respectively.  Satting 4%.  HEENT:  The head, eyes, ears, nose and throat are significant for muddy  sclerae.  No oral lesions.  The patient does have dentures.  NECK:  No nodes. No JVD,  CHEST:  The chest is clear to auscultation bilaterally without rales,  rhonchi, wheezes or rubs.  HEART:  Cardiovascular is regular with no S3.  ABDOMEN:  The abdomen is soft and nontender.  No hepatosplenomegaly.  EXTREMITIES:  There is no edema.  There does appear to be some clubbing.  NEUROLOGIC:  The neuro exam is nonfocal.    LABORATORY DATA:  Chest x-ray shows low lung volume and atelectasis. It  shows signs of COPD.  BMET; sodium 137, potassium 4.0, chloride 105, BUN 11,  creatinine  1.2.  Point of care enzymes times one were within normal limits.  EKG; nonspecific T wave abnormality, prolonged QT, inverted T laterally V4  through V6,which is changed when compared to EKG when the T waves were  upright in V4 through V6.   ASSESSMENT:  1.  Syncope.  Please note the patient's has negative orthostasis and does carry a history  of coronary artery disease.   1.  Coronary artery disease status post stent times five years ago with a      myocardial infarction in the distant past.  2.  Hypertension.  3.  Recent upper respiratory infection, nearly resolved.  4.  History of gastric ulcer status post partial gastrectomy.   RECOMMENDATIONS:  1.  Recommend the patient be admitted to the telemetry floor for further      evaluation of syncope.  2.  We will rule out cardiac cause, either ischemia or arrhythmia;      orthostasis are negative at this time.  Must also consider vasovagal as      the patient's friend stated that he did have an episode of emesis after      coming around from passing out.  3.  Must also consider a possibility of his medications i.e. the Coricidin      or the new medication the he has been taking for BPH, which he cannot      remember the name of that medication.  4.  However, with the patient's history of coronary artery disease it is      most important to rule this out as the primary cause.  The patient will      be monitored and have serial cardiac enzymes.  5.  Also since he complained of having some trouble with his vision would      recommend a CT of his head and check carotids.  6.  Will make further recommendations after review of the above studies.      Deirdre Peer. Polite, M.D.  Electronically Signed    RDP/MEDQ  D:  09/09/2005  T:  09/09/2005  Job:  [id number]

## 2011-03-12 NOTE — Cardiovascular Report (Signed)
NAMESHOJI, PERTUIT                          ACCOUNT NO.:  [id number]   MEDICAL RECORD NO.:  [id number]                   PATIENT TYPE:   LOCATION:                                       FACILITY:  [facility]   PHYSICIAN:  03/30/2003                          DATE OF BIRTH:  [birth year]   DATE OF PROCEDURE:  03/29/2003  DATE OF DISCHARGE:                              CARDIAC CATHETERIZATION   REPORT TITLE:  CARDIAC CATHETERIZATION AND PERCUTANEOUS TRANSLUMINAL  CORONARY ANGIOPLASTY REPORT   CARDIOLOGIST:  Madaline Savage, M.D.   PROCEDURES PERFORMED:  1. Selective coronary angiography by Judkins technique.  2. Retrograde left heart catheterization.  3. Left ventriculography.  4. Abdominal aortography.  5. Percutaneous coronary intervention of the mid circumflex coronary artery     with intracoronary artery stenting.   COMPLICATIONS:  None.   ENTRY SITE:  Right femoral.   DYE USED:  Omnipaque.   PATIENT PROFILE:  The patient is a 77 year old African American gentleman  who is known to have 100% occlusion of his coronary artery in the mid  portion distal to the acute marginal.  He has been treated medically over  time because of good collateral flow to this distal RCA.  After a long  period of no followup with [place], he was seen on March 28, 2003, with reports of,  over the last month, having mid sternal and left breast and epigastric chest  pain occurring 2-3 times a week without radiation.   He has a history of two previous gastric surgeries and a history of  intolerance to aspirin.  There is no known allergy to aspirin, however.  He  also has hypertension, treated hyperlipidemia, and two left hip replacements  and is on disability secondary to his hip replacement.  He enters the  catheterization laboratory today on an outpatient basis electively.  Laboratories show hemoglobin 14, hematocrit 41, platelet count 229,000,  PT/INR 1.06, BUN 13, creatinine 0.9, and electrolytes were  normal.  His  baseline EKG shows normal sinus rhythm at a rate of 62 a minute and  nonspecific ST-T abnormalities and nonspecific poor  R-wave progression  across the precordium.   RESULTS:  PRESSURES:  The left ventricular pressures were 110/0, end-  diastolic pressure 13.  Central aortic pressure was 115/70, mean of 90.  No  aortic valve gradient, however, by pullback technique.   ANGIOGRAPHIC RESULTS:  1. The left main coronary artery was a very large vessel and was normal.  2. The left anterior descending coronary artery courses to the cardiac apex     and wraps around the apex and has mild calcification after the first     septal perforator branch, but no significant lesions throughout.  Tiny     diagonals arise from this LAD.  The septal perforator branch is large and  bifurcates and supplies left to right collaterals to an occluded RCA.  3. The intermediate ramus branch of the LAD is a fairly large vessel that is     long and shows lesions of 50% mid, 75% distal, and 75-90% beyond an area     of aneurysmal dilatation in the vessel.  This vessel does not appear to     be a good candidate for percutaneous intervention because of the     strategic locations of these lesions.  4. The left circumflex vessel contains a 75% stenosis in the mid portion of     the vessel just beyond the takeoff of two obtuse marginal branches, the     first of which is a very large branch and distally the circumflex gives     rise to two more obtuse marginal branches of medium size.  The lesion in     the mid vessel is a type B2 lesion that is 95% stenosed.  5. The right coronary artery is 100% occluded proximally just beyond a     pulmonary conus branch.  There is mild homocollateral flow through the     proximal to the mid RCA; however, most substantial amount collateral flow     is seen through a very large pulmonary conus branch which communicates     distally with branches of the RCA.  6. The  left subclavian artery is normal.  Internal mammary artery is small     on the left.   LEFT VENTRICULAR ANGIOGRAM:  Left ventricular angiogram is complicated by  ventricular ectopy.  The aortic root does not appear to be enlarged.  No  wall motion abnormalities are appreciated.  LVEF of 60% without mitral  regurgitation.  Abdominal aorta shows no evidence of abdominal aorta  pathology other than plaque formation and both renal arteries appear normal.   PERCUTANEOUS CORONARY INTERVENTION:  Percutaneous coronary intervention on  the mid circumflex was accomplished using a left 7-French 4 cm guide  catheter, a Guidant Whisper wire to which multiple bends were placed in the  vessel in order for the wire to successfully traverse the left main and  circumflex and was predilated with a 2.5 x 20 mm Maverick balloon to 6  atmospheres of pressure which yielded stenotic resolution at about 4-6  atmospheres of pressure.  Next, a 24 mm long 2.5 TAXUS stent was placed into  the mid circumflex taking care to make sure the ends of the 24 stent were  centrally placed over the previously placed 20 mm Maverick balloon.  This  TAXUS stent was then deployed to 16 atmospheres on two occasions with  excellent result, resolution of the 95% stenosis to 0% residual, and also  preservation of all obtuse marginal branches.  TIMI 3  flow was preserved.  The patient was on Angiomax during the procedure.  ACT was 270.  He had 150  mg of Plavix given during the procedure and 40 mg of IV Protonix.   FINAL DIAGNOSES:  1. Successful percutaneous intervention of the mid circumflex with a CYPHER     stent with reduction of a 95% stenosis to 0% residual.  2. Residual intermediate ramus branch disease and right coronary artery     disease that will be treated medically.  3. Normal left ventricular systolic function.     Madaline Savage, M.D.                   03/30/2003  WHG/MEDQ  D:  03/29/2003  T:  03/29/2003   Job:  [id number]   cc:   Thora Lance, M.D.  [address]  [place]  [place] [address]  Fax: [phone]   Cardiac Cath Lab

## 2011-03-12 NOTE — Op Note (Signed)
NAMEGIFFORD, BALLON                          ACCOUNT NO.:  [id number]   MEDICAL RECORD NO.:  [id number]                   PATIENT TYPE:  INP   LOCATION:  0482                                 FACILITY:  [facility]   PHYSICIAN:  Ollen Gross, M.D.                 DATE OF BIRTH:  Feb 05, 1932   DATE OF PROCEDURE:  05/22/2003  DATE OF DISCHARGE:                                 OPERATIVE REPORT   PREOPERATIVE DIAGNOSIS:  Left acetabular loosening.   POSTOPERATIVE DIAGNOSIS:  Left acetabular loosening.   PROCEDURE:  Left acetabular revision with allografting.   SURGEON:  Gus Rankin. Aluisio, M.D.   ASSISTANT:  Alexzandrew L. Perkins, P.A.-C.   ANESTHESIA:  General.   ESTIMATED BLOOD LOSS:  200.   DRAIN:  Hemovac x 1.   COMPLICATIONS:  None.   CONDITION:  Stable to recovery.   BRIEF CLINICAL NOTE:  Mr. Calica is a 77 year old male, who had a left  total hip done approximately 10 years ago and has had progressive increased  pain in his groin and has radiographic evidence of severe acetabular  loosening.  He also has a massive osteolysis in the acetabulum.  He presents  now for revision with grafting.   PROCEDURE IN DETAIL:  After the successful administration of general  anesthetic, the patient is placed in the right lateral decubitus position  with the left side up and held with the hip positioner.  The left lower  extremity is isolated from his perineum with plastic drapes and prepped and  draped in the usual sterile fashion.  About one-third of the patient's  previous posterolateral incision is reutilized, skin cut with a 10 blade  through the subcutaneous tissue to the level of the fascia lata which is  incised in line with the skin incision.  Sciatic nerve is palpated and  protected and pseudocapsule excised off the posterior aspect of the femur to  show the hip joint.  His acetabular component was grossly loose and tilted  beyond vertical.  We were able to dislocate the hip,  remove the femoral  head, and retract the femur anteriorly.  Once acetabular exposure was  obtained, I had to remove some of the superior rim of the acetabulum in  order to get the component out.  There was a massive cavitary defect  present.  There was no evidence of any segmental bone loss.  He had even  eroded through the medial wall of the acetabulum and had material even in  the pubic bone.  Once we effectively debrided all of the lytic membrane,  then I began reaming.  We removed the 54 mm cup.  I started out with a 53 mm  reamer and went in increments of 2 to 59.  We were going to place a 60 mm  Pinnacle acetabular shell.  We utilized 60 mL of cancellous allograft  utilizing the I.C. bone graft  chamber.  We used the Symphony grafting  system.  I effectively filled all the defects with the cancellous allograft  and then reverse reamed with a 57 reamer.  We then placed a 60 mm Pinnacle  acetabular shell in anatomic position.  We had excellent press fit.  I  transfixed it with three additional bone screws with excellent purchase.  We  then placed a 36 mm neutral plus 4 trial liner.  His Osteonics stem was well-  fixed.  There was some lysis present within the trochanteric area but no  evidence of any cortical disruption.  We placed a 36 plus 0 head, reduced  the hip, and there was outstanding stability with full extension, full  external rotation, 70 degrees of flexion, 40 degrees adduction, and 90  degrees internal rotation, then 90 degrees of flexion and 70 degrees  internal rotation.  We then removed the trials and placed the permanent 36  mm neutral plus 4 Marathon liner with the acetabular shell, and then the 36  plus 0 Osteonics femoral head onto the femoral component.  We reduced the  hip with the same parameters.  The wound was copiously irrigated with  antibiotic solution, and posterior tissue is reattached to the femur through  drill holes.  Fascia lata is closed over a  Hemovac drain with interrupted #1  Vicryl, subcu closed with 2-0 Vicryl, and skin with staples.  The incision  is cleaned and dried and Steri-Strips and a bulky sterile dressing applied.  He is then awakened and transported to recovery in stable condition.                                               Ollen Gross, M.D.    FA/MEDQ  D:  05/22/2003  T:  05/22/2003  Job:  [id number]

## 2011-03-12 NOTE — Procedures (Signed)
EEG NUMBER:  [id number].   CLINICAL HISTORY:  A 77 year old man evaluated for syncopal episode.  EEG is  performed for evaluation of possible seizure. The patient is described as  awake and drowsy.  This is a routine EEG done with photic stimulation but  not hyperventilation.   DESCRIPTION:  The dominant rhythm of this tracing is a moderate amplitude  Alpha rhythm of 9 to 10 Hz which predominates posteriorly and appears  without abnormal asymmetry and attenuates with eye opening and closing.  Low  amplitude fast activity is seen frontally and centrally and appears without  abnormal asymmetry.  No focal slowing is noted.  No epileptiform discharges  are seen.  Drowsiness occurs naturally as evidenced by fragmentation of the  background and generalized attenuation of rhythm into a low amplitude theta  state.  No abnormalities seen in drowsiness.  Stage II sleep is not  achieved.  Photic stimulation produced symmetric driving responses.  Hyperventilation was not performed.  Single channel devoted to EKG reveals  sinus rhythm throughout with a rate of approximately 78 beats per minute.   CONCLUSION:  Normal study in awake and drowsy states.      Michael L. Thad Ranger, M.D.  Electronically Signed     ZOX:WRUE  D:  09/13/2005 21:08:03  T:  09/14/2005 10:47:29  Job #:  [id number]   cc:   Deirdre Peer. Polite, M.D.

## 2011-03-12 NOTE — Discharge Summary (Signed)
[name], Robert [name]                          ACCOUNT NO.:  [id number]   MEDICAL RECORD NO.:  [id number]                   PATIENT TYPE:  INP   LOCATION:  0482                                 FACILITY:  [name]   PHYSICIAN:  Ollen Gross, M.D.                 DATE OF BIRTH:  10-05-1932   DATE OF ADMISSION:  05/22/2003  DATE OF DISCHARGE:  05/26/2003                                 DISCHARGE SUMMARY   ADMITTING DIAGNOSES:  1. Aseptic loosening left total hip.  2. Hypertension.  3. Coronary arterial disease.  4. History of myocardial infarction.  5. History of ulcers.   DISCHARGE DIAGNOSES:  1. Left acetabular loosening, status post left acetabular revision and     allografting.  2. Postoperative mild hyponatremia, improved.  3. Postoperative mild hypokalemia, improved.  4. Hypertension.  5. Coronary arterial disease.  6. History of myocardial infarction.  7. History of ulcers.   PROCEDURE:  The patient was taken to the OR on May 22, 2003, underwent a  left acetabular revision with allografting.  Surgeon:  Dr. Homero Fellers Aluisio.  Assistant:  [name], Robert [name].  Surgery done under general anesthesia.  Estimated blood loss 200 mL.  Hemovac drain x1.   CONSULTS:  [facility].   BRIEF HISTORY:  The patient is a 77 year old male seen by Dr. Worthy Rancher in  the past who has recently been treated by Dr. Jodi Geralds over at [facility] for a painful left hip.  He was noted to have severe osteolysis  and severe loosening of the acetabular component.  Subsequently, he was sent  over to Dr. Lequita Halt for evaluation.  His initial hip surgery was 30 years  ago and then had a total hip by Dr. Darrelyn Hillock in 1991.  He started having some  osteolysis in 2001 and was placed on Fosamax.  In 2002 he was followed up  with no change.  Subsequently, he had some worsening of his symptoms.  He  saw Dr. Luiz Blare, who discovered the loosening in the acetabular component.  States that he has pain all the time.  He followed back up with Dr. Lequita Halt,  where x-rays showed gross loosening of the cementless acetabular shell which  was tilted to beyond vertical and severe osteolysis present through the  acetabulum.  It was noted at the time the patient had an Omniflex stem which  appeared to be in good position and did not show any evidence of substance  or loosening.  There was noted a cyst in the greater trochanter but did not  extend beyond the proximal stem.  Due to the significant findings of the  acetabular component and also to avoid any kind of further problems and  catastrophic outcome it was felt he would best be served by undergoing  surgery.  The risks and benefits were discussed.  The patient was  subsequently admitted to the  [name].   LABORATORY DATA:  CBC on admission showed a hemoglobin of 13.8, hematocrit  of 40.5, white cell count 4.2, red cell count 4.61.  Postoperative H&H 11.9  and 34.8.  Last noted H&H 11.8 and 34.3.  Differential on admission:  CBC  all within normal limits.  PT and PTT on admission 12.5 and 33 respectively.  Follow-up PT/INR 13.6 and 1.1.  Chemistry panel on admission:  Minimally  elevated AST, otherwise chemistry panel all within normal limits.  Serial  BMETs were followed.  Sodium dropped from 142 down to 132, back up to 134.  Potassium dropped postoperatively from 4.2 down to 3.3, back up to 4.3.  Urinalysis on admission was negative.  Blood group type O negative.   Chest x-ray dated May 14, 2003:  No active disease.  Hip films on the left  dated May 14, 2003:  Aseptic loosening left total hip of the acetabular  component.  Postoperative hip and pelvis films on May 22, 2003:  Satisfactory position status post insertion of new acetabular cup.   [name] COURSE:  The patient was admitted to [name], taken to  the OR, and underwent the above-stated procedure without complication.  The  patient tolerated  the procedure well and was later transferred to the  recovery room and then taken to the orthopedic floor for continued  postoperative care.  Vital signs were followed.  The patient was given 48  hours of postoperative IV Ancef, placed on Coumadin for three weeks for DVT  prophylaxis, placed touchdown weightbearing.  PT and OT were consulted to  assist with gait-training ambulation and ADLs.  Postoperative consult was  called to [name] to assist with medical management  of the patient postoperatively.  The patient was placed on PCA analgesics  for pain control following surgery.  Hemovac drain which was placed at the  time of surgery was pulled on postoperative day #1.  He was noted to have a  drop in sodium and potassium following surgery on day #1.  Fluids were  KVO'd, and he was placed on potassium supplements.  The patient was also  seen on day #1 by [name].  His Plavix, which he had  been off of preoperatively, was resumed postoperatively.  He was started  back on his Plavix because of his coronary stents.  By day #2 the patient  was doing much better with his pain control.  He had already been weaned  over to p.o. medications, and PCA was discontinued.  Due to the patient's  significant history it was felt he should be on Lovenox during the [name]  course.  Since he was going back on Plavix and aspirin, we decided to hold  the Coumadin.  Therefore, the Coumadin was stopped, and he was started on  Lovenox, and then he was going to continue his Plavix and aspirin after he  was discharged from the [name].  From a therapy standpoint, the patient  actually did very well with physical therapy.  He was already up ambulating  40 feet by day #1.  He continued to progress very well, 100 feet by day #2  and day #3.  Dressing change was initiated on postoperative day #2. Incision was healing well.  He was maintaining his hip precautions.   Discharge planning was making arrangements for home health PT.  By day #4  the patient was doing quite well.  His pain was under control.  He only  had  a very scant amount of small drainage from the previous Hemovac site.  Otherwise, the incision was healing well.  He was progressing with his  therapy, and it was decided the patient could be discharged home at that  time.   DISCHARGE PLAN:  1. The patient was discharged home on May 26, 2003.  2. Discharge diagnoses:  Please see above.  3. Discharge medications:  The patient is to go back on his Plavix and     aspirin.  Percocet for pain.  Robaxin for spasm.  4. The patient was touchdown weightbearing, hip precautions.  5. Follow up two weeks from surgery.  6. May start showering five days after surgery.    DISPOSITION:  Home.   CONDITION UPON DISCHARGE:  Improved.     Alexzandrew L. Julien Girt, P.A.              Ollen Gross, M.D.    ALP/MEDQ  D:  06/19/2003  T:  06/19/2003  Job:  [id number]   cc:   Thora Lance, M.D.  [address] [place]  [place] [address]  Fax: [phone]   Dr. Candis Shine, M.D.  [phone] [address], [address]  [place]  [place] [address]  Fax: [phone]

## 2011-03-12 NOTE — Discharge Summary (Signed)
[name], Robert [name]                 ACCOUNT NO.:  [id number]   MEDICAL RECORD NO.:  [id number]          PATIENT TYPE:  INP   LOCATION:  2007                         FACILITY:  [facility]   PHYSICIAN:  Quita Skye. Hart Rochester, M.D.  DATE OF BIRTH:  19-Oct-1932   DATE OF ADMISSION:  02/22/2008  DATE OF DISCHARGE:  02/25/2008                               DISCHARGE SUMMARY   PATIENT OF:  Quita Skye. Hart Rochester, MD   DIAGNOSES:  1. Large right common iliac artery aneurysm.  2. Hypertension.  3. Coronary artery disease status post remote myocardial infarction      with recent Cardiolite study.  4. Hyperlipidemia.   PROCEDURES PERFORMED:  Endovascular repair of right common iliac  arterial aneurysm by Dr. Hart Rochester on February 22, 2008.   COMPLICATIONS:  None.   DISCHARGE MEDICATIONS:  1. Finasteride 5 mg p.o. daily.  2. Amlodipine 5 mg p.o. daily.  3. Lasix 20 mg p.o. daily.  4. Plavix 75 mg p.o. daily.  5. Simvastatin 20 mg p.o. daily.  6. Alphagan eye drops to each eye b.i.d.  7. Percocet 5/325 one p.o. q.4 h p.r.n. pain, 20 were given.   DISPOSITION:  He has been discharged home in stable condition with his  wounds healing well.  He is to clean his wounds with soap and warm  water.  He is to observe the wounds for drainage, increasing redness,  swelling, pain, or fever greater than 101.2.  He is to see Dr. Hart Rochester in  4 weeks with a CTA of the abdomen and pelvis using stent graft protocol  and obtain ABIs.  The office will call him with the appointment.   BRIEF IDENTIFYING STATEMENT:  For complete details, please refer to the  typed history and physical.  Briefly, this very pleasant 77 year old  gentleman was referred to Dr. Hart Rochester for evaluation of a large right  common iliac aneurysm.  Dr. Hart Rochester found him to be a suitable candidate  for surgery.  He was informed of the risks and benefits of the  procedure, and after careful consideration, elected to proceed with  surgery.   HOSPITAL COURSE:   Preoperative workup completed as an outpatient.  He  was admitted through the same day surgery, taken to the operating room,  and underwent stenting of his aorta, right and left common iliac  arteries with a Gore Excluder stent graft.  This was performed by Dr.  Hart Rochester through bilateral cut-down incisions.  For complete details,  please refer the typed operative report.  The procedure was without  complications.  He was returned to the Post Anesthesia Care Unit  extubated.  Following stabilization, he was transferred to a bed in a  Surgical Step-Down Unit.  The following day, he was transferred to a bed  on a Surgical Convalescent Floor.  On the second  postoperative day, he was found to have a fever to 100.2.  We elected to  keep him in the hospital one more day.  His fevers abated.  He was  afebrile the following morning.  His white count was normal.  He was  discharged home in stable condition.  Condition at Discharge------  Improved      Wilmon Arms, PA      Quita Skye Hart Rochester, M.D.  Electronically Signed    KEL/MEDQ  D:  02/26/2008  T:  02/27/2008  Job:  [id number]

## 2011-03-12 NOTE — Discharge Summary (Signed)
[name], Robert [name]              ACCOUNT NO.:  [id number]   MEDICAL RECORD NO.:  [id number]          PATIENT TYPE:  INP   LOCATION:  4707                         FACILITY:  [facility]   PHYSICIAN:  Kela Millin, M.D.DATE OF BIRTH:  [birth year]   DATE OF ADMISSION:  09/08/2005  DATE OF DISCHARGE:  09/14/2005                           DISCHARGE SUMMARY - REFERRING   DISCHARGE DIAGNOSES:  1.  Syncope - unclear etiology, question medication induced.  2.  Hypertension.  3.  History of coronary artery disease.  4.  History of benign prostatic hypertrophy.  5.  History of gastric ulcers.   STUDIES:  1.  CT scan of chest - negative for pulmonary emboli, advanced changes of      COPD with basilar spurring and atelectasis noted.  2.  CT scan of head - no acute intracranial findings.  3.  EEG - No seizure activity noted.   CONSULTATION:  Cardiology, [facility].   HISTORY:  The patient is a 77 year old black male with known history of  coronary artery disease, hypertension, BPH and gastric ulcer who presented  to the ER following a syncopal episode.  The patient reported that he had  had a URI which was resolving. Prior to going to a friend's house he two  chloracetin tablets and while at that house he began to feel a little woozy  and felt as if he had some trouble with his vision - as if he could not see.  Shortly thereafter, the friend reported that he just got clammy and went  limp.  The friend reported that the patient was unconscious for about three  minutes, 911 was called and patient brought to the ER.  In the ER, the  patient was alert, alert, in no apparent distress.  Vital signs were stable  and patient not orthostatic.  He had an EKG done that showed some  nonspecific T-wave changes with a questionable prolonged QT.  He denied  fevers, chills, chest pain - just stated that he had just felt funny in his  head and the above complaints of difficulty with his  vision.  He denied any  prior syncopal spells.  He was admitted to the [facility] service  for further evaluation and management.   PHYSICAL EXAMINATION ON ADMISSION:  As per Dr. Nehemiah Settle.  VITAL SIGNS:  Temperature 97, blood pressure 117/74 lying, blood pressure of  120/80 sitting and blood pressure of 125/79 standing.  Pulses were 65, 70  and 87, respectively.  HEENT:  He was noted to have muddy sclerae on HEENT exam as well as  dentures.  EXTREMITIES:  He had some clubbing.  The rest of his exam was noted to be within normal limits.   LABORATORY DATA:  Chest x-ray showed low lung volumes and atelectasis with  changes of COPD.   Sodium was 137, potassium 4, chloride 105, BUN 11, creatinine 1.2.  Point of  care enzymes within normal limits.   EKG showed nonspecific T-wave abnormalities, prolonged QT, inverted T-waves  in lateral lead V4 through V6 which was a change compared to his previous  EKG.   HOSPITAL COURSE:  PROBLEM #1 -  SYNCOPE:  Upon admission, the patient had a  CT angiogram and the results as stated above - negative for PE.  He also had  a CT scan of the head which did not show any acute intracranial findings.  An EEG was done which showed no seizure activity.  Cardiology was consulted  and [facility] followed the patient.  A 2-D echo was  ordered.  The results were pending at the time of discharge and he was to  follow up with [facility] for the results.  It was  noted that the patient had taken chloracetin tablets for his URI just prior  to his symptom onset and also he reported that he had been on doxazosin.  The doxazosin was held during his hospital stay.  He remained  hemodynamically stable with no syncopal episodes.  The impression was that  syncope was probably medication induced and the patient was to follow up  with cardiology for further cardiac evaluation as appropriate.   PROBLEM #2 -  HYPERTENSION:   Patient was maintained on Norvasc during his  hospital stay.   PROBLEM #3 -  HISTORY OF CORONARY ARTERY DISEASE:  As above, patient is to  follow up with [facility].   PROBLEM #4 -  HISTORY OF GASTRIC ULCER DISEASE:  Patient was maintained on  proton pump inhibitor during his hospital stay.   DISCHARGE MEDICATIONS:  Patient was to continue preadmission medications and  to follow up with his primary care physician as well as cardiology.   FOLLOW UP CARE:  1.  Madaline Savage, M.D., as scheduled.  2.  Dr. Valentina Lucks in four to six weeks.      Kela Millin, M.D.  Electronically Signed     ACV/MEDQ  D:  12/15/2005  T:  12/15/2005  Job:  [id number]   cc:   Dr. Valentina Lucks

## 2011-03-18 ENCOUNTER — Other Ambulatory Visit: Payer: Self-pay | Admitting: Vascular Surgery

## 2011-03-19 ENCOUNTER — Other Ambulatory Visit: Payer: Self-pay | Admitting: Vascular Surgery

## 2011-03-19 DIAGNOSIS — I714 Abdominal aortic aneurysm, without rupture: Secondary | ICD-10-CM

## 2011-05-13 ENCOUNTER — Encounter: Payer: Self-pay | Admitting: Vascular Surgery

## 2011-05-18 ENCOUNTER — Other Ambulatory Visit: Payer: Self-pay

## 2011-05-18 ENCOUNTER — Ambulatory Visit: Payer: Self-pay | Admitting: Vascular Surgery

## 2011-05-25 ENCOUNTER — Ambulatory Visit: Payer: Self-pay | Admitting: Vascular Surgery

## 2011-05-25 ENCOUNTER — Other Ambulatory Visit: Payer: Self-pay

## 2011-05-31 ENCOUNTER — Inpatient Hospital Stay
Admission: RE | Admit: 2011-05-31 | Discharge: 2011-05-31 | Payer: Self-pay | Source: Ambulatory Visit | Attending: Vascular Surgery | Admitting: Vascular Surgery

## 2011-06-25 ENCOUNTER — Encounter: Payer: Self-pay | Admitting: Vascular Surgery

## 2011-06-29 ENCOUNTER — Ambulatory Visit: Payer: Self-pay | Admitting: Vascular Surgery

## 2011-06-29 NOTE — Progress Notes (Deleted)
Subjective:     Patient ID: Laurice Kimmons, male   DOB: 07-16-1932, 77 y.o.   MRN: [id number]  HPI  Past Medical History  Diagnosis Date  . Hypertension   . Hyperlipidemia   . Myocardial infarction   . CAD (coronary artery disease)   . Ulcer     History  Substance Use Topics  . Smoking status: Former Smoker -- .5 years    Types: Cigarettes    Quit date: 10/25/1998  . Smokeless tobacco: Not on file  . Alcohol Use: No    Family History  Problem Relation Age of Onset  . Stroke Father     Allergies  Allergen Reactions  . Asa W/Codeine (Aspirin-Codeine)     Current outpatient prescriptions:clopidogrel (PLAVIX) 75 MG tablet, Take 75 mg by mouth daily.  , Disp: , Rfl:   There were no vitals taken for this visit.  There is no height or weight on file to calculate BMI.       Review of Systems     Objective:   Physical Exam     Assessment:     ***    Plan:     ***

## 2011-07-12 ENCOUNTER — Encounter: Payer: Self-pay | Admitting: Vascular Surgery

## 2011-07-13 ENCOUNTER — Encounter: Payer: Self-pay | Admitting: Vascular Surgery

## 2011-07-13 ENCOUNTER — Ambulatory Visit (INDEPENDENT_AMBULATORY_CARE_PROVIDER_SITE_OTHER): Payer: Medicare Other | Admitting: Vascular Surgery

## 2011-07-13 VITALS — BP 125/71 | HR 50 | Resp 16 | Wt 155.0 lb

## 2011-07-13 DIAGNOSIS — I723 Aneurysm of iliac artery: Secondary | ICD-10-CM

## 2011-07-13 NOTE — Progress Notes (Signed)
Subjective:     Patient ID: [name], male   DOB: 02-26-1932, 77 y.o.   MRN: [id number]  HPI this 77 year old male patient returns for followup regarding a stent graft repair of his right common iliac artery aneurysm. He was followed last year with a duplex scan which looked very good and no evidence of recurrence of the aneurysm and no evidence of endoleak. Today he was supposed to have had a CT angiogram but due to a rescheduling of appointments that was not obtained. He denies any abdominal or back pain and has been doing well since his last visit one year ago.  Past Medical History  Diagnosis Date  . Hypertension   . Hyperlipidemia   . Myocardial infarction   . CAD (coronary artery disease)   . Ulcer     History  Substance Use Topics  . Smoking status: Former Smoker -- .5 years    Types: Cigarettes    Quit date: 10/25/1998  . Smokeless tobacco: Not on file  . Alcohol Use: No    Family History  Problem Relation Age of Onset  . Stroke Father     Allergies  Allergen Reactions  . Asa W/Codeine (Aspirin-Codeine)     Current outpatient prescriptions:alendronate (FOSAMAX) 70 MG tablet, Take 70 mg by mouth every 7 (seven) days. Take with a full glass of water on an empty stomach. , Disp: , Rfl: ;  clopidogrel (PLAVIX) 75 MG tablet, Take 75 mg by mouth daily.  , Disp: , Rfl: ;  finasteride (PROSCAR) 5 MG tablet, Take 5 mg by mouth daily.  , Disp: , Rfl: ;  furosemide (LASIX) 20 MG tablet, Take 20 mg by mouth 2 (two) times daily.  , Disp: , Rfl:  lisinopril (PRINIVIL,ZESTRIL) 20 MG tablet, Take 20 mg by mouth daily.  , Disp: , Rfl: ;  simvastatin (ZOCOR) 20 MG tablet, Take 20 mg by mouth at bedtime.  , Disp: , Rfl:   BP 125/71  Pulse 50  Resp 16  Wt 155 lb (70.308 kg)  There is no height on file to calculate BMI.        Review of Systems he denies chest pain dyspnea on exertion PND orthopnea hemoptysis claudication symptoms denies any urinary symptoms as well. Does  complain of joint pain muscle pain and urinary frequency all other systems are negative and complete review of    Objective:   Physical Exam blood pressure 125/71 heart rate 50 respirations 16 Gen. he is a thin alert and oriented male who is in no apparent distress Chest good auscultation no rhonchi or wheezing Cardiovascular regular rhythm no murmurs HEENT exam normal for age Abdomen soft nontender no pulsatile mass He has 3+ femoral and distal pulses bilaterally Neurologic exam normal    Assessment:     Doing well post stent graft repair right common iliac artery and your    Plan:     Return one year with CT angiogram of abdomen and pelvis to check stent graft right common iliac artery aneurysm

## 2011-07-20 LAB — CROSSMATCH: Antibody Screen: NEGATIVE

## 2011-07-20 LAB — CBC
HCT: 34.4 — ABNORMAL LOW
HCT: 41.2
Hemoglobin: 12.4 — ABNORMAL LOW
MCHC: 34.1
MCHC: 34.6
MCV: 86.3
MCV: 86.3
Platelets: 202
Platelets: 253
RBC: 4.22
RDW: 15.4
WBC: 3.7 — ABNORMAL LOW

## 2011-07-20 LAB — COMPREHENSIVE METABOLIC PANEL
ALT: 13
AST: 21
Albumin: 3.9
Alkaline Phosphatase: 57
BUN: 14
CO2: 28
Chloride: 104
Creatinine, Ser: 1.12
GFR calc Af Amer: >60
GFR calc non Af Amer: >60
Glucose, Bld: 121 — ABNORMAL HIGH
Potassium: 4.4
Sodium: 139
Total Bilirubin: 0.9
Total Protein: 7.3

## 2011-07-20 LAB — URINE MICROSCOPIC-ADD ON

## 2011-07-20 LAB — PROTIME-INR
INR: 0.9
INR: 1
Prothrombin Time: 13.7
Prothrombin Time: 13.9

## 2011-07-20 LAB — BASIC METABOLIC PANEL
BUN: 7
CO2: 27
Calcium: 8.7
Chloride: 105
Creatinine, Ser: 0.89
GFR calc Af Amer: >60

## 2011-07-20 LAB — URINALYSIS, ROUTINE W REFLEX MICROSCOPIC
Bilirubin Urine: NEGATIVE
Nitrite: NEGATIVE
Protein, ur: NEGATIVE
Specific Gravity, Urine: 1.008
Urobilinogen, UA: 0.2

## 2011-07-20 LAB — APTT: aPTT: 30

## 2011-07-20 LAB — ABO/RH: ABO/RH(D): O NEG

## 2011-07-21 LAB — CBC
HCT: 32.9 — ABNORMAL LOW
Hemoglobin: 11.5 — ABNORMAL LOW
MCHC: 34.4
MCV: 86.1
Platelets: 209
RBC: 3.88 — ABNORMAL LOW
RDW: 15.3
WBC: 7.3
WBC: 7.4

## 2011-07-21 LAB — COMPREHENSIVE METABOLIC PANEL
ALT: 13
CO2: 26
Calcium: 8.6
Chloride: 97
Creatinine, Ser: 0.94
GFR calc non Af Amer: >60
Glucose, Bld: 156 — ABNORMAL HIGH
Sodium: 132 — ABNORMAL LOW
Total Bilirubin: 1

## 2011-08-11 LAB — CBC
Hemoglobin: 12.6 — ABNORMAL LOW
MCHC: 32.7
RBC: 4.42
WBC: 4.9

## 2011-08-11 LAB — I-STAT 8, (EC8 V) (CONVERTED LAB)
Acid-Base Excess: 1
Bicarbonate: 26.2 — ABNORMAL HIGH
Chloride: 104
HCT: 41
Operator id: 277751
TCO2: 27
pCO2, Ven: 40.5 — ABNORMAL LOW
pH, Ven: 7.418 — ABNORMAL HIGH

## 2011-08-11 LAB — POCT I-STAT CREATININE
Creatinine, Ser: 1.2
Operator id: 277751

## 2011-08-11 LAB — POCT CARDIAC MARKERS
CKMB, poc: <1 — ABNORMAL LOW
Myoglobin, poc: 63.4
Myoglobin, poc: 73.8
Operator id: 272551
Operator id: 277751
Troponin i, poc: <0.05
Troponin i, poc: <0.05
Troponin i, poc: <0.05

## 2011-08-11 LAB — DIFFERENTIAL
Basophils Relative: 0
Lymphocytes Relative: 27
Lymphs Abs: 1.3
Monocytes Absolute: 0.6
Monocytes Relative: 11
Neutro Abs: 2.9
Neutrophils Relative %: 58

## 2011-09-02 ENCOUNTER — Other Ambulatory Visit: Payer: Self-pay | Admitting: Internal Medicine

## 2011-09-02 DIAGNOSIS — R221 Localized swelling, mass and lump, neck: Secondary | ICD-10-CM

## 2011-09-06 ENCOUNTER — Ambulatory Visit
Admission: RE | Admit: 2011-09-06 | Discharge: 2011-09-06 | Disposition: A | Discharge status: Home / Self Care | Payer: Medicare Other | Source: Ambulatory Visit | Attending: Internal Medicine | Admitting: Internal Medicine

## 2011-09-06 DIAGNOSIS — R221 Localized swelling, mass and lump, neck: Secondary | ICD-10-CM

## 2011-09-06 MED ORDER — IOHEXOL 300 MG/ML  SOLN: 75.0000 mL | Freq: Once | INTRAMUSCULAR | Status: AC | PRN

## 2011-10-26 DIAGNOSIS — K56609 Unspecified intestinal obstruction, unspecified as to partial versus complete obstruction: Secondary | ICD-10-CM

## 2011-10-26 HISTORY — DX: Unspecified intestinal obstruction, unspecified as to partial versus complete obstruction: K56.609

## 2011-11-07 ENCOUNTER — Other Ambulatory Visit: Payer: Self-pay

## 2011-11-07 ENCOUNTER — Encounter (HOSPITAL_COMMUNITY): Payer: Self-pay | Admitting: *Deleted

## 2011-11-07 ENCOUNTER — Inpatient Hospital Stay (HOSPITAL_COMMUNITY)
Admission: EM | Admit: 2011-11-07 | Discharge: 2011-11-10 | DRG: 390 | Disposition: A | Discharge status: Home / Self Care | Payer: Medicare Other | Attending: Internal Medicine | Admitting: Internal Medicine

## 2011-11-07 ENCOUNTER — Emergency Department (HOSPITAL_COMMUNITY): Payer: Medicare Other

## 2011-11-07 DIAGNOSIS — IMO0002 Reserved for concepts with insufficient information to code with codable children: Secondary | ICD-10-CM | POA: Insufficient documentation

## 2011-11-07 DIAGNOSIS — I2510 Atherosclerotic heart disease of native coronary artery without angina pectoris: Secondary | ICD-10-CM | POA: Diagnosis not present

## 2011-11-07 DIAGNOSIS — N289 Disorder of kidney and ureter, unspecified: Secondary | ICD-10-CM | POA: Diagnosis present

## 2011-11-07 DIAGNOSIS — I252 Old myocardial infarction: Secondary | ICD-10-CM | POA: Diagnosis not present

## 2011-11-07 DIAGNOSIS — I2119 ST elevation (STEMI) myocardial infarction involving other coronary artery of inferior wall: Secondary | ICD-10-CM | POA: Insufficient documentation

## 2011-11-07 DIAGNOSIS — Z96649 Presence of unspecified artificial hip joint: Secondary | ICD-10-CM

## 2011-11-07 DIAGNOSIS — N2889 Other specified disorders of kidney and ureter: Secondary | ICD-10-CM | POA: Diagnosis not present

## 2011-11-07 DIAGNOSIS — K56609 Unspecified intestinal obstruction, unspecified as to partial versus complete obstruction: Principal | ICD-10-CM | POA: Diagnosis present

## 2011-11-07 DIAGNOSIS — Z7902 Long term (current) use of antithrombotics/antiplatelets: Secondary | ICD-10-CM

## 2011-11-07 DIAGNOSIS — E785 Hyperlipidemia, unspecified: Secondary | ICD-10-CM | POA: Diagnosis not present

## 2011-11-07 DIAGNOSIS — N189 Chronic kidney disease, unspecified: Secondary | ICD-10-CM | POA: Diagnosis not present

## 2011-11-07 DIAGNOSIS — I219 Acute myocardial infarction, unspecified: Secondary | ICD-10-CM

## 2011-11-07 DIAGNOSIS — Z9861 Coronary angioplasty status: Secondary | ICD-10-CM | POA: Diagnosis not present

## 2011-11-07 DIAGNOSIS — J449 Chronic obstructive pulmonary disease, unspecified: Secondary | ICD-10-CM | POA: Diagnosis not present

## 2011-11-07 DIAGNOSIS — I10 Essential (primary) hypertension: Secondary | ICD-10-CM | POA: Diagnosis present

## 2011-11-07 DIAGNOSIS — J4489 Other specified chronic obstructive pulmonary disease: Secondary | ICD-10-CM | POA: Diagnosis not present

## 2011-11-07 DIAGNOSIS — K6389 Other specified diseases of intestine: Secondary | ICD-10-CM | POA: Diagnosis not present

## 2011-11-07 DIAGNOSIS — Z87891 Personal history of nicotine dependence: Secondary | ICD-10-CM

## 2011-11-07 DIAGNOSIS — E875 Hyperkalemia: Secondary | ICD-10-CM | POA: Diagnosis present

## 2011-11-07 DIAGNOSIS — J984 Other disorders of lung: Secondary | ICD-10-CM | POA: Diagnosis not present

## 2011-11-07 DIAGNOSIS — R110 Nausea: Secondary | ICD-10-CM | POA: Diagnosis not present

## 2011-11-07 DIAGNOSIS — R109 Unspecified abdominal pain: Secondary | ICD-10-CM | POA: Diagnosis not present

## 2011-11-07 LAB — COMPREHENSIVE METABOLIC PANEL
ALT: 11 U/L (ref 0–53)
AST: 18 U/L (ref 0–37)
Albumin: 3.9 g/dL (ref 3.5–5.2)
Calcium: 9.8 mg/dL (ref 8.4–10.5)
Creatinine, Ser: 0.84 mg/dL (ref 0.50–1.35)
Sodium: 137 mEq/L (ref 135–145)

## 2011-11-07 LAB — DIFFERENTIAL
Basophils Absolute: 0 10*3/uL (ref 0.0–0.1)
Basophils Relative: 0 % (ref 0–1)
Eosinophils Relative: 0 % (ref 0–5)
Monocytes Absolute: 0.3 10*3/uL (ref 0.1–1.0)
Neutro Abs: 6.9 10*3/uL (ref 1.7–7.7)

## 2011-11-07 LAB — URINALYSIS, ROUTINE W REFLEX MICROSCOPIC
Glucose, UA: NEGATIVE mg/dL
Leukocytes, UA: NEGATIVE
Specific Gravity, Urine: 1.018 (ref 1.005–1.030)
pH: 6 (ref 5.0–8.0)

## 2011-11-07 LAB — CBC
HCT: 38.8 % — ABNORMAL LOW (ref 39.0–52.0)
MCHC: 32 g/dL (ref 30.0–36.0)
MCV: 81 fL (ref 78.0–100.0)
Platelets: 255 10*3/uL (ref 150–400)
RDW: 14.4 % (ref 11.5–15.5)
WBC: 8.2 10*3/uL (ref 4.0–10.5)

## 2011-11-07 MED ORDER — SODIUM CHLORIDE 0.9 % IV BOLUS (SEPSIS)
500.0000 mL | Freq: Once | INTRAVENOUS | Status: AC
  Administered 2011-11-07: 500 mL via INTRAVENOUS

## 2011-11-07 MED ORDER — BRIMONIDINE TARTRATE-TIMOLOL 0.2-0.5 % OP SOLN: 1.0000 [drp] | Freq: Every day | OPHTHALMIC | Status: DC

## 2011-11-07 MED ORDER — TIMOLOL MALEATE 0.5 % OP SOLN
1.0000 [drp] | Freq: Every day | OPHTHALMIC | Status: DC
  Administered 2011-11-08 – 2011-11-09 (×2): 1 [drp] via OPHTHALMIC
  Filled 2011-11-07: qty 5

## 2011-11-07 MED ORDER — LATANOPROST 0.005 % OP SOLN
1.0000 [drp] | Freq: Every day | OPHTHALMIC | Status: DC
  Administered 2011-11-07 – 2011-11-09 (×3): 1 [drp] via OPHTHALMIC
  Filled 2011-11-07: qty 2.5

## 2011-11-07 MED ORDER — ONDANSETRON HCL 4 MG/2ML IJ SOLN
INTRAMUSCULAR | Status: AC
  Filled 2011-11-07: qty 2

## 2011-11-07 MED ORDER — MORPHINE SULFATE 2 MG/ML IJ SOLN: 0.5000 mg | INTRAMUSCULAR | Status: DC | PRN

## 2011-11-07 MED ORDER — IOHEXOL 300 MG/ML  SOLN
100.0000 mL | Freq: Once | INTRAMUSCULAR | Status: AC | PRN
  Administered 2011-11-07: 100 mL via INTRAVENOUS

## 2011-11-07 MED ORDER — ONDANSETRON HCL 4 MG/2ML IJ SOLN
4.0000 mg | Freq: Once | INTRAMUSCULAR | Status: AC
  Administered 2011-11-07: 4 mg via INTRAVENOUS
  Filled 2011-11-07 (×2): qty 2

## 2011-11-07 MED ORDER — BRIMONIDINE TARTRATE 0.2 % OP SOLN
1.0000 [drp] | Freq: Every day | OPHTHALMIC | Status: DC
  Administered 2011-11-07 – 2011-11-09 (×3): 1 [drp] via OPHTHALMIC
  Filled 2011-11-07: qty 5

## 2011-11-07 MED ORDER — FENTANYL CITRATE 0.05 MG/ML IJ SOLN
25.0000 ug | Freq: Once | INTRAMUSCULAR | Status: AC
  Administered 2011-11-07: 25 ug via INTRAVENOUS
  Filled 2011-11-07 (×2): qty 2

## 2011-11-07 MED ORDER — HEPARIN SODIUM (PORCINE) 5000 UNIT/ML IJ SOLN
5000.0000 [IU] | Freq: Three times a day (TID) | INTRAMUSCULAR | Status: DC
  Administered 2011-11-07 – 2011-11-10 (×8): 5000 [IU] via SUBCUTANEOUS
  Filled 2011-11-07 (×12): qty 1

## 2011-11-07 MED ORDER — SODIUM CHLORIDE 0.9 % IV SOLN
Freq: Once | INTRAVENOUS | Status: AC
  Administered 2011-11-07: 15:00:00 via INTRAVENOUS

## 2011-11-07 MED ORDER — METOPROLOL TARTRATE 1 MG/ML IV SOLN
2.5000 mg | Freq: Four times a day (QID) | INTRAVENOUS | Status: DC
  Filled 2011-11-07 (×11): qty 5

## 2011-11-07 MED ORDER — ONDANSETRON HCL 4 MG/2ML IJ SOLN
4.0000 mg | Freq: Once | INTRAMUSCULAR | Status: AC
  Administered 2011-11-07: 4 mg via INTRAVENOUS

## 2011-11-07 MED ORDER — SODIUM CHLORIDE 0.9 % IV SOLN
Freq: Once | INTRAVENOUS | Status: AC
  Administered 2011-11-07: 23:00:00 via INTRAVENOUS

## 2011-11-07 MED ORDER — HYDROMORPHONE HCL PF 1 MG/ML IJ SOLN
1.0000 mg | Freq: Once | INTRAMUSCULAR | Status: AC
  Administered 2011-11-07: 1 mg via INTRAVENOUS
  Filled 2011-11-07: qty 1

## 2011-11-07 NOTE — ED Provider Notes (Signed)
Patient report received from [place], [place].  78 year old male with history of gastrectomy presenting with right abdominal pain with associated nausea. Patient has been evaluated by [facility].  His initial x-ray shows obstructive bowel gas concerning for small bowel obstruction. Patient has been moved to the CDU while waiting for abdominal CT scan.  2:35 PM On examination, pt is moderately tender to mid abdomen, with distension.  Chest nontender, lung CTAB. Pt without nausea but does endorse pain.  Pain medication given.  Pt to finish oral contrast in preparation for abd/pelvic CT scan.  VSS.  Sign out to [facility], PA-C  Fayrene Helper, PA-C 11/07/11 1518  Fayrene Helper, PA-C 11/10/11 [phone]

## 2011-11-07 NOTE — ED Notes (Signed)
Pt has available bed, called floor to give report and spoke with, RN.

## 2011-11-07 NOTE — ED Provider Notes (Signed)
History     CSN: [id number]  Arrival date & time 11/07/11  0850   First MD Initiated Contact with Patient 11/07/11 [phone]      Reports right side abdominal pain that began about 14 hours ago. Associated with nausea. Ate a mcrib at 1500pm and is unsure if this is the cause. H/o gastrectomy 50 years ago.  Patient is a 78 y.o. male presenting with abdominal pain. The history is provided by the patient and a friend.  Abdominal Pain The primary symptoms of the illness include abdominal pain and vomiting. The primary symptoms of the illness do not include fever, diarrhea or dysuria. The current episode started 13 to 24 hours ago. The onset of the illness was gradual. The problem has been gradually worsening.  The abdominal pain has been gradually worsening since its onset. The abdominal pain is located in the RLQ. The abdominal pain does not radiate.  The patient has not had a change in bowel habit. Risk factors for an acute abdominal problem include being elderly. Symptoms associated with the illness do not include chills, heartburn, constipation, hematuria, frequency or back pain. Significant associated medical issues include PUD.    Past Medical History  Diagnosis Date  . Hypertension   . Hyperlipidemia   . Myocardial infarction   . CAD (coronary artery disease)   . Ulcer     Past Surgical History  Procedure Date  . Eye surgery cataract surgery right eye  . Joint replacement left hip replacement  . Gastrectomy 50 yrs ago - 2 operations  . Coronary angioplasty with stent placement 20 yrs ago    Family History  Problem Relation Age of Onset  . Stroke Father     History  Substance Use Topics  . Smoking status: Former Smoker -- .5 years    Types: Cigarettes    Quit date: 10/25/1998  . Smokeless tobacco: Not on file  . Alcohol Use: No      Review of Systems  Constitutional: Negative for fever and chills.  Gastrointestinal: Positive for vomiting and abdominal pain. Negative  for heartburn, diarrhea and constipation.  Genitourinary: Negative for dysuria, frequency, hematuria and flank pain.  Musculoskeletal: Negative for back pain.  All other systems reviewed and are negative.    Allergies  Asa w/codeine  Home Medications   Current Outpatient Rx  Name Route Sig Dispense Refill  . ALENDRONATE SODIUM 70 MG PO TABS Oral Take 70 mg by mouth every 7 (seven) days. Take with a full glass of water on an empty stomach. Friday    . BRIMONIDINE TARTRATE-TIMOLOL 0.2-0.5 % OP SOLN Both Eyes Place 1 drop into both eyes daily.    Marland Kitchen CLOPIDOGREL BISULFATE 75 MG PO TABS Oral Take 75 mg by mouth daily.      Marland Kitchen FINASTERIDE 5 MG PO TABS Oral Take 5 mg by mouth daily.      . FUROSEMIDE 20 MG PO TABS Oral Take 20 mg by mouth daily.     Marland Kitchen LATANOPROST 0.005 % OP SOLN Left Eye Place 1 drop into the left eye daily.    Marland Kitchen LISINOPRIL 20 MG PO TABS Oral Take 20 mg by mouth daily.      Marland Kitchen SIMVASTATIN 20 MG PO TABS Oral Take 20 mg by mouth at bedtime.        BP 138/89  Pulse 58  Temp(Src) 97.5 F (36.4 C) (Oral)  Resp 18  Ht 5\' 11"  (1.803 m)  Wt 160 lb (72.576 kg)  BMI  22.32 kg/m2  SpO2 98%  Physical Exam  Vitals reviewed. Constitutional: He is oriented to person, place, and time. He appears well-developed and well-nourished.  HENT:  Head: Normocephalic and atraumatic.  Eyes: Conjunctivae are normal. Pupils are equal, round, and reactive to light.  Neck: Normal range of motion. Neck supple.  Cardiovascular: Normal rate, regular rhythm and normal heart sounds.   Pulmonary/Chest: Effort normal and breath sounds normal.  Abdominal: Soft. Bowel sounds are normal. He exhibits no distension and no mass. There is no tenderness. There is no rebound and no guarding.  Neurological: He is alert and oriented to person, place, and time.  Skin: Skin is warm and dry. No rash noted. No erythema. No pallor.  Psychiatric: He has a normal mood and affect. His behavior is normal.    ED Course    Procedures  MDM          Abe People, PA-C 11/07/11 1610

## 2011-11-07 NOTE — ED Notes (Signed)
Iv team paged for access, attempted x 2 with no success

## 2011-11-07 NOTE — Consult Note (Signed)
Reason for Consult: Small bowel obstruction Referring Physician: Dr. Donney Rankins Robert [name] is an 78 y.o. male.  HPI: Patient developed crampy abdominal pain yesterday several hours after eating some salmon. The pain was episodic but gradually worsened. He described it as crampy and midabdomen in location. It continued to worsen and he called his friend and came to the emergency department. He initially did not have nausea and vomiting. Once in the emergency department he drank some contrast for CAT scan and then vomited. Since that time he has felt significantly better. The pain is decreased. His last bowel movement was last night.  Past Medical History  Diagnosis Date  . Hypertension   . Hyperlipidemia   . Myocardial infarction   . CAD (coronary artery disease)   . Ulcer     Past Surgical History  Procedure Date  . Eye surgery cataract surgery right eye  . Joint replacement left hip replacement  . Gastrectomy 50 yrs ago - 2 operations    had surgery at the age of 89 for the ulcer  . Coronary angioplasty with stent placement 20 yrs ago    Dr Elsie Lincoln  . Iliac artery aneurysm repair     Repaied by Dr. Hart Rochester april 2009    Family History  Problem Relation Age of Onset  . Stroke Father     Social History:  reports that he has quit smoking. His smoking use included Cigarettes. He quit after .5 years of use. He does not have any smokeless tobacco history on file. He reports that he does not drink alcohol or use illicit drugs.  Allergies:  Allergies  Allergen Reactions  . Asa W/Codeine (Aspirin-Codeine)     Medications: I have reviewed the patient's current medications.  Results for orders placed during the hospital encounter of 11/07/11 (from the past 48 hour(s))  CBC     Status: Abnormal   Collection Time   11/07/11 10:19 AM      Component Value Range Comment   WBC 8.2  4.0 - 10.5 (K/uL)    RBC 4.79  4.22 - 5.81 (MIL/uL)    Hemoglobin 12.4 (*) 13.0 - 17.0 (g/dL)    HCT  47.8 (*) 29.5 - 52.0 (%)    MCV 81.0  78.0 - 100.0 (fL)    MCH 25.9 (*) 26.0 - 34.0 (pg)    MCHC 32.0  30.0 - 36.0 (g/dL)    RDW 62.1  30.8 - 65.7 (%)    Platelets 255  150 - 400 (K/uL)   DIFFERENTIAL     Status: Abnormal   Collection Time   11/07/11 10:19 AM      Component Value Range Comment   Neutrophils Relative 84 (*) 43 - 77 (%)    Neutro Abs 6.9  1.7 - 7.7 (K/uL)    Lymphocytes Relative 11 (*) 12 - 46 (%)    Lymphs Abs 0.9  0.7 - 4.0 (K/uL)    Monocytes Relative 4  3 - 12 (%)    Monocytes Absolute 0.3  0.1 - 1.0 (K/uL)    Eosinophils Relative 0  0 - 5 (%)    Eosinophils Absolute 0.0  0.0 - 0.7 (K/uL)    Basophils Relative 0  0 - 1 (%)    Basophils Absolute 0.0  0.0 - 0.1 (K/uL)   COMPREHENSIVE METABOLIC PANEL     Status: Abnormal   Collection Time   11/07/11 10:19 AM      Component Value Range Comment   Sodium 137  135 - 145 (mEq/L)    Potassium 5.4 (*) 3.5 - 5.1 (mEq/L)    Chloride 100  96 - 112 (mEq/L)    CO2 27  19 - 32 (mEq/L)    Glucose, Bld 165 (*) 70 - 99 (mg/dL)    BUN 11  6 - 23 (mg/dL)    Creatinine, Ser 0.45  0.50 - 1.35 (mg/dL)    Calcium 9.8  8.4 - 10.5 (mg/dL)    Total Protein 8.1  6.0 - 8.3 (g/dL)    Albumin 3.9  3.5 - 5.2 (g/dL)    AST 18  0 - 37 (U/L)    ALT 11  0 - 53 (U/L)    Alkaline Phosphatase 74  39 - 117 (U/L)    Total Bilirubin 0.5  0.3 - 1.2 (mg/dL)    GFR calc non Af Amer 82 (*) >90 (mL/min)    GFR calc Af Amer >90  >90 (mL/min)   LIPASE, BLOOD     Status: Normal   Collection Time   11/07/11 10:19 AM      Component Value Range Comment   Lipase 24  11 - 59 (U/L)   URINALYSIS, ROUTINE W REFLEX MICROSCOPIC     Status: Normal   Collection Time   11/07/11  2:07 PM      Component Value Range Comment   Color, Urine YELLOW  YELLOW     APPearance CLEAR  CLEAR     Specific Gravity, Urine 1.018  1.005 - 1.030     pH 6.0  5.0 - 8.0     Glucose, UA NEGATIVE  NEGATIVE (mg/dL)    Hgb urine dipstick NEGATIVE  NEGATIVE     Bilirubin Urine NEGATIVE   NEGATIVE     Ketones, ur NEGATIVE  NEGATIVE (mg/dL)    Protein, ur NEGATIVE  NEGATIVE (mg/dL)    Urobilinogen, UA 0.2  0.0 - 1.0 (mg/dL)    Nitrite NEGATIVE  NEGATIVE     Leukocytes, UA NEGATIVE  NEGATIVE  MICROSCOPIC NOT DONE ON URINES WITH NEGATIVE PROTEIN, BLOOD, LEUKOCYTES, NITRITE, OR GLUCOSE <1000 mg/dL.    Ct Abdomen Pelvis W Contrast  11/07/2011  *RADIOLOGY REPORT*  Clinical Data: Abdominal pain and vomiting.  History of gastric surgery for ulcers.  Flank pain.  History of myocardial infarction and chronic kidney disease.  CT ABDOMEN AND PELVIS WITH CONTRAST  Technique:  Multidetector CT imaging of the abdomen and pelvis was performed following the standard protocol during bolus administration of intravenous contrast.  Contrast: OMNIPAQUE IOHEXOL 300 MG/ML IV SOLN  Comparison: Acute abdomen series earlier in the day.  CT angio of 10/08/2008.  Findings: Bibasilar opacities.  Likely atelectasis on the right. Suspect mild fibrosis at the right lung base.  Minimally increased since 2009.  Mild cardiomegaly with coronary artery atherosclerosis.  Numerous hepatic cysts.  Normal spleen.  Small hiatal hernia, with surgical changes at the gastroesophageal junction.  Question prior Nissen fundoplication.  Moderate pancreatic atrophy with pancreatic ductal dilatation, unchanged.  No obstructive cause identified.  Cholecystectomy.  Normal gallbladder.  Common duct measures upper normal for prior cholecystectomy state, 8 mm.  This also similar. Normal adrenal glands.  Bilateral left greater than right renal cysts.  Interpolar left renal lesion measures 47 HU and 1.0 cm on portal venous phase image 29.  44 HU on delayed images.  Status post aortic Endograft repair.  No evidence of endoleak.  No retroperitoneal or retrocrural adenopathy.  Beam hardening artifact degrades evaluation of the pelvis, secondary  to left hip arthroplasty.  The descending colon is relatively decompressed.  The appendix is not  confidently identified.  Proximal small bowel loops are normal in caliber and primarily positioned within the right side the abdomen.  Fluid- filled bowel loops are identified within the mid small bowel. These are dilated, measuring up to 3.3 cm.  A "small bowel feces sign" is identified consistent with a component of chronic obstruction.  The bowel undergoes a transition between dilated and decompressed on images 55 - 54.  There is mesenteric edema within this area.  Small bowel in the region of the transition has hypoenhancing walls (example image 55) compared to more proximal small bowel loops.  No pneumatosis or free intraperitoneal air identified.  Native right common iliac artery aneurysm is unchanged at 3.1 cm on image 57.  Limited evaluation for pelvic adenopathy, secondary to beam hardening artifact.  Both iliac limbs of the endograft enhance normally.  Grossly normal urinary bladder, with mild prostatomegaly.  Small volume minimally complex cul-de-sac fluid on image 71. No acute osseous abnormality.  IMPRESSION:  1.  Partial small bowel obstruction.  Mid small bowel level. Given concurrent mesenteric edema and small volume complex pelvic fluid in the region of the dilated bowel loops.  Complicating ischemia and internal hernia cannot be entirely excluded. I personally called and discussed this report with Irving Burton, p.a.  at 4:25 p.m. on 11/07/2011. 2.  Similar appearance of aortic Endograft repair without acute complication. 3.  Left renal lesion which is not a simple cyst.  Either a complex cyst or a solid neoplasm.  Pre and post contrast non emergent dedicated renal CT or MRI should be considered.  Original Report Authenticated By: Consuello Bossier, M.D.   Dg Abd Acute W/chest  11/07/2011  *RADIOLOGY REPORT*  Clinical Data: Nausea and abdominal pain.  ACUTE ABDOMEN SERIES (ABDOMEN 2 VIEW & CHEST 1 VIEW)  Comparison: Abdominal pelvic CT of 10/08/2008.  Plain film chest of 02/23/2008.  Findings: Frontal view  of the chest demonstrates underlying hyperinflation/COPD. Midline trachea.  Normal heart size.  Tortuous thoracic aorta.  Biapical pleural thickening. No pleural effusion or pneumothorax.  Patchy areas of lower lobe atelectasis or scarring.  Abominal films demonstrate no free intraperitoneal air on upright positioning.  Numerous air-fluid levels within small bowel.  Small bowel is dilated, measuring up to 4.5 cm.  There is gas and stool within the colon.  No pneumatosis.  Prior aortic/common iliac artery stent graft placement.  Left hip arthroplasty.  Stool within the rectum.  Vascular calcifications.  IMPRESSION:  1. Findings suspicious for partial small bowel obstruction.  Focal adynamic ileus could look similar.  No complicating feature. 2.  Hyperinflation/COPD, without acute cardiopulmonary disease.  Original Report Authenticated By: Consuello Bossier, M.D.    Review of Systems  Constitutional: Negative.   HENT: Negative.   Eyes: Negative.   Respiratory: Negative.   Cardiovascular: Negative.   Gastrointestinal: Positive for nausea, vomiting and abdominal pain. Negative for heartburn, diarrhea, constipation and blood in stool.  Genitourinary: Negative.   Musculoskeletal: Negative.   Skin: Negative.   Neurological: Negative.   Endo/Heme/Allergies: Negative.    Blood pressure 146/79, pulse 66, temperature 98.7 F (37.1 C), temperature source Oral, resp. rate 16, height 5\' 11"  (1.803 m), weight 72.576 kg (160 lb), SpO2 97.00%. Physical Exam  Constitutional: He is oriented to person, place, and time. He appears well-developed and well-nourished. No distress.  HENT:  Head: Normocephalic and atraumatic.  Eyes: EOM are normal. Pupils  are equal, round, and reactive to light. No scleral icterus.  Neck: Normal range of motion. Neck supple.  Cardiovascular: Normal rate, regular rhythm and normal heart sounds.   Respiratory: Effort normal and breath sounds normal. No respiratory distress. He has no  wheezes. He has no rales.  GI: Soft. He exhibits distension. There is no tenderness. There is no rebound and no guarding.       Quiet, mild distention  Musculoskeletal: Normal range of motion.  Neurological: He is alert and oriented to person, place, and time.  Skin: Skin is warm and dry.    Assessment/Plan: Partial small bowel obstruction. The patient is nontender with normal white count. I recommend NG tube decompression and n.p.o. I agree with medical admission. We will follow the patient's exam as well as abdominal x-rays. Should the patient not improve with these therapies, surgery may be needed. This was discussed in detail with the patient and his daughter. Questions were answered.  Kiasia Chou E 11/07/2011, 5:59 PM

## 2011-11-07 NOTE — ED Notes (Signed)
Attempt to call report and placed on hold. Report not given.  Care endorsed to Di Kindle., RN

## 2011-11-07 NOTE — ED Provider Notes (Signed)
4:45 PM Patient is in CDU holding for CT abd/pelvis, question of small bowel obstruction.  Patient reports his pain is improved now, denies any nausea.  Received call from Dr Reche Dixon stating patient with small bowel obstruction with concern for complicated ischemia vs internal hernia vs closed loop obstruction.  I have spoken with Dr Janee Morn, general surgery, who will come to see patient, requests medical admission.  I have discussed results and plan with patient who verbalizes understanding. No needs at this time.   Admitted to [facility], who will come see and admit the patient.  Dr Janee Morn also to see patient.     Date: 11/07/2011  Rate: 67  Rhythm: normal sinus rhythm  QRS Axis: normal  Intervals: normal  ST/T Wave abnormalities: t wave flattening and inversions  Conduction Disutrbances:none  Narrative Interpretation:   Old EKG Reviewed: changes noted (new t wave changes V3 V4 compared to May 2009)    Dillard Cannon [place], [place] 11/07/11 1802

## 2011-11-07 NOTE — ED Notes (Signed)
CT Tech giving pt oral contrast with instructions.  Pt aware of need for urine specimen - states is unable to void at this time.

## 2011-11-07 NOTE — H&P (Signed)
Robert [name] is an 78 y.o. male.   PCP-John Griffin  CC-N/v  HPI- 79yo aam. Started to have a slight pain in his abdomen started very slowly gettign worse and seems like it escalated, with no relief.  Stomach was paining on the right lower quadrant of the abdomen.  Never had pain like this before.  Constant pain-which eased on.  Has had food poisoning before.  Was hot and cold.  No diarrhea-but had some loose stool last week-usually does eat out per family.  Has had vomiting only when he had contrast here in the emergency room--threw up about 3 x here. Some diffculty swallwing.  No flatus today.  Last stool was last night-was a normal size and amoutn of stool.  Usually has a bm q 2 days.  No blood in stool usually and no h/o vmiting No cp/sob/double vision, seizures, no faints, , bno weakness on any one side of the body.  In the emergency room workup done showed hyperkalemia 5.4 BUN to creatinine 11/0.84 LFTs within normal limits. He has elevated glucose on the night of 165 and not a known diabetic WBC 8.2 Hemoccult and 12.4 hematocrit 38.8 neutrophilia 84. Urine analysis is negative. EKG normal sinus rhythm axis 0 depressed T waves in leads 3,4 and 5.  AA series=1. Findings suspicious for partial small bowel obstruction. Focaladynamic ileus could look similar. No complicating feature. Ct scan=Partial small bowel obstruction. Mid small bowel level. Given concurrent mesenteric edema and small volume complex pelvic fluid in the region of the dilated bowel loops Similar appearance of aortic Endograft repair without acute Complication. Left renal lesion which is not a simple cyst. Either a complex cyst or a solid neoplasm. Pre and post contrast non emergent dedicated renal CT or MRI should be considered   Past Medical History  Diagnosis Date  . Hypertension   . Hyperlipidemia   . Myocardial infarction   . CAD (coronary artery disease)   . Ulcer     Past Surgical History  Procedure Date    . Eye surgery cataract surgery right eye  . Joint replacement left hip replacement  . Gastrectomy 50 yrs ago - 2 operations    had surgery at the age of 82 for the ulcer  . Coronary angioplasty with stent placement 20 yrs ago    Dr Elsie Lincoln  . Iliac artery aneurysm repair     Repaied by Dr. Hart Rochester april 2009    Family History  Problem Relation Age of Onset  . Stroke Father    Social History:  reports that he has quit smoking. His smoking use included Cigarettes. He quit after .5 years of use. He does not have any smokeless tobacco history on file. He reports that he does not drink alcohol or use illicit drugs.  Allergies:  Allergies  Allergen Reactions  . Asa W/Codeine (Aspirin-Codeine)     Medications Prior to Admission  Medication Dose Route Frequency Provider Last Rate Last Dose  . 0.9 %  sodium chloride infusion   Intravenous Once Suzi Roots, MD 50 mL/hr at 11/07/11 1443    . 0.9 %  sodium chloride infusion   Intravenous Once Emily B West, PA      . brimonidine-timolol (COMBIGAN) 0.2-0.5 % ophthalmic solution 1 drop  1 drop Both Eyes Daily Pleas Koch, MD      . fentaNYL (SUBLIMAZE) injection 25 mcg  25 mcg Intravenous Once Suzi Roots, MD   25 mcg at 11/07/11 1158  . HYDROmorphone (DILAUDID)  injection 1 mg  1 mg Intravenous Once Fayrene Helper, PA-C   1 mg at 11/07/11 1443  . iohexol (OMNIPAQUE) 300 MG/ML solution 100 mL  100 mL Intravenous Once PRN Medication Radiologist   100 mL at 11/07/11 1548  . latanoprost (XALATAN) 0.005 % ophthalmic solution 1 drop  1 drop Left Eye Daily Pleas Koch, MD      . ondansetron [facility]) injection 4 mg  4 mg Intravenous Once Suzi Roots, MD   4 mg at 11/07/11 1159  . ondansetron (ZOFRAN) injection 4 mg  4 mg Intravenous Once Fayrene Helper, PA-C   4 mg at 11/07/11 1448  . sodium chloride 0.9 % bolus 500 mL  500 mL Intravenous Once Suzi Roots, MD   500 mL at 11/07/11 1157   Medications Prior to Admission  Medication Sig Dispense Refill   . alendronate (FOSAMAX) 70 MG tablet Take 70 mg by mouth every 7 (seven) days. Take with a full glass of water on an empty stomach. Friday      . clopidogrel (PLAVIX) 75 MG tablet Take 75 mg by mouth daily.        . finasteride (PROSCAR) 5 MG tablet Take 5 mg by mouth daily.        . furosemide (LASIX) 20 MG tablet Take 20 mg by mouth daily.       Marland Kitchen lisinopril (PRINIVIL,ZESTRIL) 20 MG tablet Take 20 mg by mouth daily.        . simvastatin (ZOCOR) 20 MG tablet Take 20 mg by mouth at bedtime.          Results for orders placed during the hospital encounter of 11/07/11 (from the past 48 hour(s))  CBC     Status: Abnormal   Collection Time   11/07/11 10:19 AM      Component Value Range Comment   WBC 8.2  4.0 - 10.5 (K/uL)    RBC 4.79  4.22 - 5.81 (MIL/uL)    Hemoglobin 12.4 (*) 13.0 - 17.0 (g/dL)    HCT 91.4 (*) 78.2 - 52.0 (%)    MCV 81.0  78.0 - 100.0 (fL)    MCH 25.9 (*) 26.0 - 34.0 (pg)    MCHC 32.0  30.0 - 36.0 (g/dL)    RDW 95.6  21.3 - 08.6 (%)    Platelets 255  150 - 400 (K/uL)   DIFFERENTIAL     Status: Abnormal   Collection Time   11/07/11 10:19 AM      Component Value Range Comment   Neutrophils Relative 84 (*) 43 - 77 (%)    Neutro Abs 6.9  1.7 - 7.7 (K/uL)    Lymphocytes Relative 11 (*) 12 - 46 (%)    Lymphs Abs 0.9  0.7 - 4.0 (K/uL)    Monocytes Relative 4  3 - 12 (%)    Monocytes Absolute 0.3  0.1 - 1.0 (K/uL)    Eosinophils Relative 0  0 - 5 (%)    Eosinophils Absolute 0.0  0.0 - 0.7 (K/uL)    Basophils Relative 0  0 - 1 (%)    Basophils Absolute 0.0  0.0 - 0.1 (K/uL)   COMPREHENSIVE METABOLIC PANEL     Status: Abnormal   Collection Time   11/07/11 10:19 AM      Component Value Range Comment   Sodium 137  135 - 145 (mEq/L)    Potassium 5.4 (*) 3.5 - 5.1 (mEq/L)    Chloride 100  96 - 112 (  mEq/L)    CO2 27  19 - 32 (mEq/L)    Glucose, Bld 165 (*) 70 - 99 (mg/dL)    BUN 11  6 - 23 (mg/dL)    Creatinine, Ser 9.60  0.50 - 1.35 (mg/dL)    Calcium 9.8  8.4 - 10.5  (mg/dL)    Total Protein 8.1  6.0 - 8.3 (g/dL)    Albumin 3.9  3.5 - 5.2 (g/dL)    AST 18  0 - 37 (U/L)    ALT 11  0 - 53 (U/L)    Alkaline Phosphatase 74  39 - 117 (U/L)    Total Bilirubin 0.5  0.3 - 1.2 (mg/dL)    GFR calc non Af Amer 82 (*) >90 (mL/min)    GFR calc Af Amer >90  >90 (mL/min)   LIPASE, BLOOD     Status: Normal   Collection Time   11/07/11 10:19 AM      Component Value Range Comment   Lipase 24  11 - 59 (U/L)   URINALYSIS, ROUTINE W REFLEX MICROSCOPIC     Status: Normal   Collection Time   11/07/11  2:07 PM      Component Value Range Comment   Color, Urine YELLOW  YELLOW     APPearance CLEAR  CLEAR     Specific Gravity, Urine 1.018  1.005 - 1.030     pH 6.0  5.0 - 8.0     Glucose, UA NEGATIVE  NEGATIVE (mg/dL)    Hgb urine dipstick NEGATIVE  NEGATIVE     Bilirubin Urine NEGATIVE  NEGATIVE     Ketones, ur NEGATIVE  NEGATIVE (mg/dL)    Protein, ur NEGATIVE  NEGATIVE (mg/dL)    Urobilinogen, UA 0.2  0.0 - 1.0 (mg/dL)    Nitrite NEGATIVE  NEGATIVE     Leukocytes, UA NEGATIVE  NEGATIVE  MICROSCOPIC NOT DONE ON URINES WITH NEGATIVE PROTEIN, BLOOD, LEUKOCYTES, NITRITE, OR GLUCOSE <1000 mg/dL.   Ct Abdomen Pelvis W Contrast  11/07/2011  *RADIOLOGY REPORT*  Clinical Data: Abdominal pain and vomiting.  History of gastric surgery for ulcers.  Flank pain.  History of myocardial infarction and chronic kidney disease.  CT ABDOMEN AND PELVIS WITH CONTRAST  Technique:  Multidetector CT imaging of the abdomen and pelvis was performed following the standard protocol during bolus administration of intravenous contrast.  Contrast: OMNIPAQUE IOHEXOL 300 MG/ML IV SOLN  Comparison: Acute abdomen series earlier in the day.  CT angio of 10/08/2008.  Findings: Bibasilar opacities.  Likely atelectasis on the right. Suspect mild fibrosis at the right lung base.  Minimally increased since 2009.  Mild cardiomegaly with coronary artery atherosclerosis.  Numerous hepatic cysts.  Normal spleen.   Small hiatal hernia, with surgical changes at the gastroesophageal junction.  Question prior Nissen fundoplication.  Moderate pancreatic atrophy with pancreatic ductal dilatation, unchanged.  No obstructive cause identified.  Cholecystectomy.  Normal gallbladder.  Common duct measures upper normal for prior cholecystectomy state, 8 mm.  This also similar. Normal adrenal glands.  Bilateral left greater than right renal cysts.  Interpolar left renal lesion measures 47 HU and 1.0 cm on portal venous phase image 29.  44 HU on delayed images.  Status post aortic Endograft repair.  No evidence of endoleak.  No retroperitoneal or retrocrural adenopathy.  Beam hardening artifact degrades evaluation of the pelvis, secondary to left hip arthroplasty.  The descending colon is relatively decompressed.  The appendix is not confidently identified.  Proximal small bowel loops  are normal in caliber and primarily positioned within the right side the abdomen.  Fluid- filled bowel loops are identified within the mid small bowel. These are dilated, measuring up to 3.3 cm.  A "small bowel feces sign" is identified consistent with a component of chronic obstruction.  The bowel undergoes a transition between dilated and decompressed on images 55 - 54.  There is mesenteric edema within this area.  Small bowel in the region of the transition has hypoenhancing walls (example image 55) compared to more proximal small bowel loops.  No pneumatosis or free intraperitoneal air identified.  Native right common iliac artery aneurysm is unchanged at 3.1 cm on image 57.  Limited evaluation for pelvic adenopathy, secondary to beam hardening artifact.  Both iliac limbs of the endograft enhance normally.  Grossly normal urinary bladder, with mild prostatomegaly.  Small volume minimally complex cul-de-sac fluid on image 71. No acute osseous abnormality.  IMPRESSION:  1.  Partial small bowel obstruction.  Mid small bowel level. Given concurrent mesenteric  edema and small volume complex pelvic fluid in the region of the dilated bowel loops.  Complicating ischemia and internal hernia cannot be entirely excluded. I personally called and discussed this report with Irving Burton, p.a.  at 4:25 p.m. on 11/07/2011. 2.  Similar appearance of aortic Endograft repair without acute complication. 3.  Left renal lesion which is not a simple cyst.  Either a complex cyst or a solid neoplasm.  Pre and post contrast non emergent dedicated renal CT or MRI should be considered.  Original Report Authenticated By: Consuello Bossier, M.D.   Dg Abd Acute W/chest  11/07/2011  *RADIOLOGY REPORT*  Clinical Data: Nausea and abdominal pain.  ACUTE ABDOMEN SERIES (ABDOMEN 2 VIEW & CHEST 1 VIEW)  Comparison: Abdominal pelvic CT of 10/08/2008.  Plain film chest of 02/23/2008.  Findings: Frontal view of the chest demonstrates underlying hyperinflation/COPD. Midline trachea.  Normal heart size.  Tortuous thoracic aorta.  Biapical pleural thickening. No pleural effusion or pneumothorax.  Patchy areas of lower lobe atelectasis or scarring.  Abominal films demonstrate no free intraperitoneal air on upright positioning.  Numerous air-fluid levels within small bowel.  Small bowel is dilated, measuring up to 4.5 cm.  There is gas and stool within the colon.  No pneumatosis.  Prior aortic/common iliac artery stent graft placement.  Left hip arthroplasty.  Stool within the rectum.  Vascular calcifications.  IMPRESSION:  1. Findings suspicious for partial small bowel obstruction.  Focal adynamic ileus could look similar.  No complicating feature. 2.  Hyperinflation/COPD, without acute cardiopulmonary disease.  Original Report Authenticated By: Consuello Bossier, M.D.    Review of Systems  Constitutional: Positive for fever and chills. Negative for weight loss, malaise/fatigue and diaphoresis.  HENT: Negative.   Eyes: Negative.   Respiratory: Negative.   Cardiovascular: Negative for chest pain, palpitations,  orthopnea, claudication and leg swelling.  Gastrointestinal: Negative.   Genitourinary: Negative.   Musculoskeletal: Negative.   Skin: Negative.   Neurological: Negative.  Negative for weakness.  Endo/Heme/Allergies: Negative.   Psychiatric/Behavioral: Negative.     Blood pressure 146/79, pulse 66, temperature 98.7 F (37.1 C), temperature source Oral, resp. rate 16, height 5\' 11"  (1.803 m), weight 72.576 kg (160 lb), SpO2 97.00%. Physical Exam  Constitutional: He is oriented to person, place, and time. He appears well-developed and well-nourished. No distress.  HENT:  Head: Normocephalic and atraumatic.  Mouth/Throat: No oropharyngeal exudate.  Eyes: Right eye exhibits no discharge. Left eye exhibits no discharge.  No scleral icterus.       Cataracts noted. Arcus+ No icterus  Neck: Normal range of motion. Neck supple. No JVD present.  Cardiovascular: Normal rate, regular rhythm and normal heart sounds.  Exam reveals no gallop and no friction rub.   No murmur heard. Respiratory: Effort normal and breath sounds normal. No respiratory distress. He has no wheezes. He has no rales. He exhibits no tenderness.  GI: Soft. He exhibits distension. There is no tenderness. There is no rebound and no guarding.       2 midline scars on upper Abdomen, no rebound or guard.  Abdomen has decreased bs's  Genitourinary:       deferred  Musculoskeletal: Normal range of motion.  Lymphadenopathy:    He has no cervical adenopathy.  Neurological: He is alert and oriented to person, place, and time. He has normal reflexes.  Skin: Skin is warm and dry.  Psychiatric: He has a normal mood and affect. His behavior is normal. Judgment and thought content normal.     Assessment/Plan Patient Active Problem List  Diagnoses  . SBO (small bowel obstruction)-patient likely has small bowel obstruction more so than either simple gastroenteritis versus small bowel if she may versus endovascular repair leak of right  iliac artery.  CT scan results as above surgeon Dr. Janee Morn has seen patient and patient will be reviewed serially will be kept n.p.o. will get repeat acute abdominal series tomorrow. I have gotten a lactate to determine whether his etiology could be ischemic in the gut however this is highly unlikely given his nontoxic parents. In addition his vitals are stable   . Myocardial infarction-no record of this nature. Patient was on Plavix and we will hold this for the time being. If his bowel function does return we will resume this.  He'll be kept on prophylactic heparin while here   . Hyperlipidemia-statin on hold until seen by surgeon and diet graduated.   . Hypertension-meds on hold will order hydralazine versus IV Metoprolol until he can take by mouth   . Ulcer-s/p Gastrectomy 40 yr ago-this likely may be the inciting event given he has 2 scars and abdomen had 2 surgeries 40 years ago for gastric ulcers. Expectant management per surgery.   . Renal mass, left-this is a new issue and has not been addressed completely with patient. I am in the fall but non-emergent MRI versus CT as an outpatient. Patient does have smoking history this raises the concern for neoplasm.   Marland Kitchen Hyperkalemia-LAD was not hemolyzed hence we will repeat and if this does recur we will likely treat with Kayexalate. His EKG does not appear to show any peaked T waves    Time-35 minutes Patient will be seen by Dr. Kirby Funk in the morning I have alerted his office to his presence hair Annalyse Langlais,JAI 11/07/2011, 6:07 PM

## 2011-11-07 NOTE — ED Notes (Signed)
MD at bedside. Dr Janee Morn here to see pt.

## 2011-11-07 NOTE — ED Notes (Signed)
Pt placed in room 19 and placed on telemetry.

## 2011-11-07 NOTE — ED Notes (Signed)
Pt has a ready room.

## 2011-11-07 NOTE — ED Notes (Signed)
Just nausea, but unable to throw up since last night. Ate mcrib from [employer] last night. No diarrhea.

## 2011-11-07 NOTE — ED Notes (Signed)
Surgeon in w pt

## 2011-11-07 NOTE — ED Notes (Signed)
Family at bedside.wife

## 2011-11-07 NOTE — ED Notes (Signed)
Pt and family member aware of need for urine specimen.

## 2011-11-07 NOTE — ED Notes (Signed)
Family at bedside. 

## 2011-11-07 NOTE — ED Notes (Signed)
CT aware pt completed contrast 

## 2011-11-07 NOTE — ED Notes (Signed)
Pt c/o n/v - vomited x 1 - Fayrene Helper, PA, aware and order received for Zofran - given.

## 2011-11-07 NOTE — ED Notes (Signed)
Admitting MD in w pt

## 2011-11-07 NOTE — ED Notes (Signed)
Attempted call report, nurse currently receiving report. Will call again.

## 2011-11-08 ENCOUNTER — Inpatient Hospital Stay (HOSPITAL_COMMUNITY): Payer: Medicare Other

## 2011-11-08 LAB — CBC
HCT: 33.5 % — ABNORMAL LOW (ref 39.0–52.0)
Hemoglobin: 10.7 g/dL — ABNORMAL LOW (ref 13.0–17.0)
MCH: 25.7 pg — ABNORMAL LOW (ref 26.0–34.0)
MCV: 80.5 fL (ref 78.0–100.0)
Platelets: 224 10*3/uL (ref 150–400)
RBC: 4.16 MIL/uL — ABNORMAL LOW (ref 4.22–5.81)
WBC: 5.2 10*3/uL (ref 4.0–10.5)

## 2011-11-08 LAB — GLUCOSE, CAPILLARY

## 2011-11-08 MED ORDER — SODIUM CHLORIDE 0.9 % IV SOLN
INTRAVENOUS | Status: DC
  Administered 2011-11-08: 22:00:00 via INTRAVENOUS

## 2011-11-08 MED ORDER — BIOTENE DRY MOUTH MT LIQD
15.0000 mL | Freq: Two times a day (BID) | OROMUCOSAL | Status: DC
  Administered 2011-11-08 (×2): 15 mL via OROMUCOSAL

## 2011-11-08 MED ORDER — CHLORHEXIDINE GLUCONATE 0.12 % MT SOLN
15.0000 mL | Freq: Two times a day (BID) | OROMUCOSAL | Status: DC
  Administered 2011-11-08 (×2): 15 mL via OROMUCOSAL
  Filled 2011-11-08 (×2): qty 15

## 2011-11-08 NOTE — Progress Notes (Signed)
No n/v. +flatus.  abd soft, nt, nd  Clears today  I saw the patient, participated in the history, exam and medical decision making, and concur with the physician assistant's note above.  Robert [name]. Andrey Campanile, MD, FACS General, Bariatric, & Minimally Invasive Surgery [facility], [place]

## 2011-11-08 NOTE — Progress Notes (Signed)
Subjective: Feeling much better, no pain or nausea, passing has  Objective: Vital signs in last 24 hours: Temp:  [97.5 F (36.4 C)-98.7 F (37.1 C)] 98.1 F (36.7 C) (01/14 0624) Pulse Rate:  [50-76] 50  (01/14 0624) Resp:  [14-21] 18  (01/14 0624) BP: (89-173)/(48-118) 125/66 mmHg (01/14 0624) SpO2:  [90 %-99 %] 93 % (01/14 0624) Weight:  [72.53 kg (159 lb 14.4 oz)-72.576 kg (160 lb)] 72.53 kg (159 lb 14.4 oz) (01/13 2139) Weight change:  Last BM Date: 11/06/11  Intake/Output from previous day: 01/13 0701 - 01/14 0700 In: 1000 [I.V.:1000] Out: 1030 [Urine:1000; Emesis/NG output:30] Intake/Output this shift: Total I/O In: -  Out: 1000 [Urine:1000]  General appearance: alert and cooperative Resp: clear to auscultation bilaterally Cardio: regular rate and rhythm, S1, S2 normal, no murmur, click, rub or gallop GI: soft, non-tender; bowel sounds normal; no masses,  no organomegaly  Lab Results:  Basename 11/07/11 1019  WBC 8.2  HGB 12.4*  HCT 38.8*  PLT 255   BMET  Basename 11/07/11 1719 11/07/11 1019  NA -- 137  K 4.2 5.4*  CL -- 100  CO2 -- 27  GLUCOSE -- 165*  BUN -- 11  CREATININE -- 0.84  CALCIUM -- 9.8    Studies/Results: Ct Abdomen Pelvis W Contrast  11/07/2011  *RADIOLOGY REPORT*  Clinical Data: Abdominal pain and vomiting.  History of gastric surgery for ulcers.  Flank pain.  History of myocardial infarction and chronic kidney disease.  CT ABDOMEN AND PELVIS WITH CONTRAST  Technique:  Multidetector CT imaging of the abdomen and pelvis was performed following the standard protocol during bolus administration of intravenous contrast.  Contrast: OMNIPAQUE IOHEXOL 300 MG/ML IV SOLN  Comparison: Acute abdomen series earlier in the day.  CT angio of 10/08/2008.  Findings: Bibasilar opacities.  Likely atelectasis on the right. Suspect mild fibrosis at the right lung base.  Minimally increased since 2009.  Mild cardiomegaly with coronary artery  atherosclerosis.  Numerous hepatic cysts.  Normal spleen.  Small hiatal hernia, with surgical changes at the gastroesophageal junction.  Question prior Nissen fundoplication.  Moderate pancreatic atrophy with pancreatic ductal dilatation, unchanged.  No obstructive cause identified.  Cholecystectomy.  Normal gallbladder.  Common duct measures upper normal for prior cholecystectomy state, 8 mm.  This also similar. Normal adrenal glands.  Bilateral left greater than right renal cysts.  Interpolar left renal lesion measures 47 HU and 1.0 cm on portal venous phase image 29.  44 HU on delayed images.  Status post aortic Endograft repair.  No evidence of endoleak.  No retroperitoneal or retrocrural adenopathy.  Beam hardening artifact degrades evaluation of the pelvis, secondary to left hip arthroplasty.  The descending colon is relatively decompressed.  The appendix is not confidently identified.  Proximal small bowel loops are normal in caliber and primarily positioned within the right side the abdomen.  Fluid- filled bowel loops are identified within the mid small bowel. These are dilated, measuring up to 3.3 cm.  A "small bowel feces sign" is identified consistent with a component of chronic obstruction.  The bowel undergoes a transition between dilated and decompressed on images 55 - 54.  There is mesenteric edema within this area.  Small bowel in the region of the transition has hypoenhancing walls (example image 55) compared to more proximal small bowel loops.  No pneumatosis or free intraperitoneal air identified.  Native right common iliac artery aneurysm is unchanged at 3.1 cm on image 57.  Limited evaluation for  pelvic adenopathy, secondary to beam hardening artifact.  Both iliac limbs of the endograft enhance normally.  Grossly normal urinary bladder, with mild prostatomegaly.  Small volume minimally complex cul-de-sac fluid on image 71. No acute osseous abnormality.  IMPRESSION:  1.  Partial small bowel  obstruction.  Mid small bowel level. Given concurrent mesenteric edema and small volume complex pelvic fluid in the region of the dilated bowel loops.  Complicating ischemia and internal hernia cannot be entirely excluded. I personally called and discussed this report with Irving Burton, p.a.  at 4:25 p.m. on 11/07/2011. 2.  Similar appearance of aortic Endograft repair without acute complication. 3.  Left renal lesion which is not a simple cyst.  Either a complex cyst or a solid neoplasm.  Pre and post contrast non emergent dedicated renal CT or MRI should be considered.  Original Report Authenticated By: Consuello Bossier, M.D.   Dg Abd Acute W/chest  11/07/2011  *RADIOLOGY REPORT*  Clinical Data: Nausea and abdominal pain.  ACUTE ABDOMEN SERIES (ABDOMEN 2 VIEW & CHEST 1 VIEW)  Comparison: Abdominal pelvic CT of 10/08/2008.  Plain film chest of 02/23/2008.  Findings: Frontal view of the chest demonstrates underlying hyperinflation/COPD. Midline trachea.  Normal heart size.  Tortuous thoracic aorta.  Biapical pleural thickening. No pleural effusion or pneumothorax.  Patchy areas of lower lobe atelectasis or scarring.  Abominal films demonstrate no free intraperitoneal air on upright positioning.  Numerous air-fluid levels within small bowel.  Small bowel is dilated, measuring up to 4.5 cm.  There is gas and stool within the colon.  No pneumatosis.  Prior aortic/common iliac artery stent graft placement.  Left hip arthroplasty.  Stool within the rectum.  Vascular calcifications.  IMPRESSION:  1. Findings suspicious for partial small bowel obstruction.  Focal adynamic ileus could look similar.  No complicating feature. 2.  Hyperinflation/COPD, without acute cardiopulmonary disease.  Original Report Authenticated By: Consuello Bossier, M.D.    Medications: I have reviewed the patient's current medications.  Assessment/Plan: Active Problems:  Partial SBO  Improved.  Check xray and consider clear liquids  CAD (coronary  artery disease) stable  Hypertension  BP ok  Renal mass, left workup after discharge     LOS: 1 day   Robert [name] 11/08/2011, 6:50 AM

## 2011-11-08 NOTE — Progress Notes (Signed)
Subjective: Pt feeling better. Passing flatus, no BM No N/V, no pain  Objective: Vital signs in last 24 hours: Temp:  [98.1 F (36.7 C)-98.7 F (37.1 C)] 98.1 F (36.7 C) (01/14 0624) Pulse Rate:  [50-76] 50  (01/14 0624) Resp:  [14-21] 18  (01/14 0624) BP: (89-173)/(48-118) 125/66 mmHg (01/14 0624) SpO2:  [90 %-98 %] 93 % (01/14 0624) Weight:  [72.53 kg (159 lb 14.4 oz)] 72.53 kg (159 lb 14.4 oz) (01/13 2139) Last BM Date: 11/06/11  Intake/Output this shift: Total I/O In: -  Out: 250 [Urine:250]  Physical Exam: BP 125/66  Pulse 50  Temp(Src) 98.1 F (36.7 C) (Oral)  Resp 18  Ht 5\' 11"  (1.803 m)  Wt 72.53 kg (159 lb 14.4 oz)  BMI 22.30 kg/m2  SpO2 93% Abdomen: soft, ND, NT, active BS.  Labs: CBC  Basename 11/08/11 0637 11/07/11 1019  WBC 5.2 8.2  HGB 10.7* 12.4*  HCT 33.5* 38.8*  PLT 224 255   BMET  Basename 11/07/11 1719 11/07/11 1019  NA -- 137  K 4.2 5.4*  CL -- 100  CO2 -- 27  GLUCOSE -- 165*  BUN -- 11  CREATININE -- 0.84  CALCIUM -- 9.8   LFT  Basename 11/07/11 1019  PROT 8.1  ALBUMIN 3.9  AST 18  ALT 11  ALKPHOS 74  BILITOT 0.5  BILIDIR --  IBILI --  LIPASE 24   PT/INR  Basename 11/07/11 2326  LABPROT 14.0  INR 1.06   ABG No results found for this basename: PHART:2,PCO2:2,PO2:2,HCO3:2 in the last 72 hours  Studies/Results: Ct Abdomen Pelvis W Contrast  11/07/2011  *RADIOLOGY REPORT*  Clinical Data: Abdominal pain and vomiting.  History of gastric surgery for ulcers.  Flank pain.  History of myocardial infarction and chronic kidney disease.  CT ABDOMEN AND PELVIS WITH CONTRAST  Technique:  Multidetector CT imaging of the abdomen and pelvis was performed following the standard protocol during bolus administration of intravenous contrast.  Contrast: OMNIPAQUE IOHEXOL 300 MG/ML IV SOLN  Comparison: Acute abdomen series earlier in the day.  CT angio of 10/08/2008.  Findings: Bibasilar opacities.  Likely atelectasis on the  right. Suspect mild fibrosis at the right lung base.  Minimally increased since 2009.  Mild cardiomegaly with coronary artery atherosclerosis.  Numerous hepatic cysts.  Normal spleen.  Small hiatal hernia, with surgical changes at the gastroesophageal junction.  Question prior Nissen fundoplication.  Moderate pancreatic atrophy with pancreatic ductal dilatation, unchanged.  No obstructive cause identified.  Cholecystectomy.  Normal gallbladder.  Common duct measures upper normal for prior cholecystectomy state, 8 mm.  This also similar. Normal adrenal glands.  Bilateral left greater than right renal cysts.  Interpolar left renal lesion measures 47 HU and 1.0 cm on portal venous phase image 29.  44 HU on delayed images.  Status post aortic Endograft repair.  No evidence of endoleak.  No retroperitoneal or retrocrural adenopathy.  Beam hardening artifact degrades evaluation of the pelvis, secondary to left hip arthroplasty.  The descending colon is relatively decompressed.  The appendix is not confidently identified.  Proximal small bowel loops are normal in caliber and primarily positioned within the right side the abdomen.  Fluid- filled bowel loops are identified within the mid small bowel. These are dilated, measuring up to 3.3 cm.  A "small bowel feces sign" is identified consistent with a component of chronic obstruction.  The bowel undergoes a transition between dilated and decompressed on images 55 - 54.  There is  mesenteric edema within this area.  Small bowel in the region of the transition has hypoenhancing walls (example image 55) compared to more proximal small bowel loops.  No pneumatosis or free intraperitoneal air identified.  Native right common iliac artery aneurysm is unchanged at 3.1 cm on image 57.  Limited evaluation for pelvic adenopathy, secondary to beam hardening artifact.  Both iliac limbs of the endograft enhance normally.  Grossly normal urinary bladder, with mild prostatomegaly.  Small  volume minimally complex cul-de-sac fluid on image 71. No acute osseous abnormality.  IMPRESSION:  1.  Partial small bowel obstruction.  Mid small bowel level. Given concurrent mesenteric edema and small volume complex pelvic fluid in the region of the dilated bowel loops.  Complicating ischemia and internal hernia cannot be entirely excluded. I personally called and discussed this report with Irving Burton, p.a.  at 4:25 p.m. on 11/07/2011. 2.  Similar appearance of aortic Endograft repair without acute complication. 3.  Left renal lesion which is not a simple cyst.  Either a complex cyst or a solid neoplasm.  Pre and post contrast non emergent dedicated renal CT or MRI should be considered.  Original Report Authenticated By: Consuello Bossier, M.D.   Dg Abd Acute W/chest  11/08/2011  *RADIOLOGY REPORT*  Clinical Data: Small bowel obstruction.  ACUTE ABDOMEN SERIES (ABDOMEN 2 VIEW & CHEST 1 VIEW)  Comparison: 11/07/2011  Findings: There are patchy densities at the lung bases which have not significantly changed.  Upper lungs are clear.  Heart and mediastinum are within normal limits.  No evidence to suggest free air.  There is an aortic stent graft present.  There is now oral contrast within the colon.  There is a decreased small bowel dilatation.  There are still a few loops of dilated small bowel in the left upper abdomen.  Left total hip replacement.  IMPRESSION: Decreased small bowel distention with oral contrast in the colon. The findings suggest improving or resolving small bowel obstruction.  Patchy densities at the lung bases probably related to areas of atelectasis.  Original Report Authenticated By: Richarda Overlie, M.D.   Dg Abd Acute W/chest  11/07/2011  *RADIOLOGY REPORT*  Clinical Data: Nausea and abdominal pain.  ACUTE ABDOMEN SERIES (ABDOMEN 2 VIEW & CHEST 1 VIEW)  Comparison: Abdominal pelvic CT of 10/08/2008.  Plain film chest of 02/23/2008.  Findings: Frontal view of the chest demonstrates underlying  hyperinflation/COPD. Midline trachea.  Normal heart size.  Tortuous thoracic aorta.  Biapical pleural thickening. No pleural effusion or pneumothorax.  Patchy areas of lower lobe atelectasis or scarring.  Abominal films demonstrate no free intraperitoneal air on upright positioning.  Numerous air-fluid levels within small bowel.  Small bowel is dilated, measuring up to 4.5 cm.  There is gas and stool within the colon.  No pneumatosis.  Prior aortic/common iliac artery stent graft placement.  Left hip arthroplasty.  Stool within the rectum.  Vascular calcifications.  IMPRESSION:  1. Findings suspicious for partial small bowel obstruction.  Focal adynamic ileus could look similar.  No complicating feature. 2.  Hyperinflation/COPD, without acute cardiopulmonary disease.  Original Report Authenticated By: Consuello Bossier, M.D.    Assessment: Principal Problem:  *SBO (small bowel obstruction) Active Problems:  CAD (coronary artery disease)  Hypertension  Renal mass, left     Plan: Clinically better and x-ray shows contrast progressed to distal colon.  No obstruction, but may have underlying constipation as he tells me he doesn't move his bowels regularly and stools are firm  as well. If he does not have bowel movement from CT contrast, would recommend starting Miralax. Will start clears today  LOS: 1 day    Marianna Fuss 11/08/2011

## 2011-11-08 NOTE — ED Provider Notes (Signed)
Medical screening examination/treatment/procedure(s) were conducted as a shared visit with non-physician practitioner(s) and myself.  I personally evaluated the patient during the encounter Mid abd pain/tenderness. No peritoneal signs, ct.   Suzi Roots, MD 11/08/11 1045

## 2011-11-08 NOTE — ED Provider Notes (Signed)
Medical screening examination/treatment/procedure(s) were conducted as a shared visit with non-physician practitioner(s) and myself.  I personally evaluated the patient during the encounter  Pt with mid abd pain and nv. Mid abd tenderness on exam. No rebound or guarding. Labs, ct, surgery consult/admit if ct pos  Suzi Roots, MD 11/08/11 1038

## 2011-11-09 ENCOUNTER — Inpatient Hospital Stay (HOSPITAL_COMMUNITY): Payer: Medicare Other

## 2011-11-09 LAB — BASIC METABOLIC PANEL
BUN: 7 mg/dL (ref 6–23)
Chloride: 105 mEq/L (ref 96–112)
Creatinine, Ser: 0.85 mg/dL (ref 0.50–1.35)
GFR calc Af Amer: >90 mL/min (ref >90)
Glucose, Bld: 129 mg/dL — ABNORMAL HIGH (ref 70–99)
Potassium: 3.8 mEq/L (ref 3.5–5.1)

## 2011-11-09 LAB — DIFFERENTIAL
Basophils Absolute: 0 10*3/uL (ref 0.0–0.1)
Basophils Relative: 0 % (ref 0–1)
Eosinophils Absolute: 0.1 10*3/uL (ref 0.0–0.7)
Eosinophils Relative: 1 % (ref 0–5)
Monocytes Absolute: 0.5 10*3/uL (ref 0.1–1.0)
Monocytes Relative: 11 % (ref 3–12)

## 2011-11-09 LAB — CBC
HCT: 35.1 % — ABNORMAL LOW (ref 39.0–52.0)
Hemoglobin: 11.2 g/dL — ABNORMAL LOW (ref 13.0–17.0)
MCH: 25.6 pg — ABNORMAL LOW (ref 26.0–34.0)
MCHC: 31.9 g/dL (ref 30.0–36.0)
MCV: 80.3 fL (ref 78.0–100.0)
RDW: 14.6 % (ref 11.5–15.5)

## 2011-11-09 LAB — GLUCOSE, CAPILLARY

## 2011-11-09 MED ORDER — LISINOPRIL 10 MG PO TABS
10.0000 mg | ORAL_TABLET | Freq: Every day | ORAL | Status: DC
  Administered 2011-11-09: 10 mg via ORAL
  Filled 2011-11-09 (×3): qty 1

## 2011-11-09 MED ORDER — POLYETHYLENE GLYCOL 3350 17 G PO PACK
17.0000 g | PACK | Freq: Two times a day (BID) | ORAL | Status: DC
  Administered 2011-11-09 – 2011-11-10 (×3): 17 g via ORAL
  Filled 2011-11-09 (×4): qty 1

## 2011-11-09 MED ORDER — CLOPIDOGREL BISULFATE 75 MG PO TABS
75.0000 mg | ORAL_TABLET | Freq: Every day | ORAL | Status: DC
  Administered 2011-11-09 – 2011-11-10 (×2): 75 mg via ORAL
  Filled 2011-11-09 (×4): qty 1

## 2011-11-09 NOTE — Progress Notes (Signed)
Subjective: No pain or nausea, has flatus.  No BM 48 hours  Objective: Vital signs in last 24 hours: Temp:  [97.5 F (36.4 C)-98.2 F (36.8 C)] 98.2 F (36.8 C) (01/15 0500) Pulse Rate:  [47-76] 47  (01/15 0500) Resp:  [18-20] 20  (01/15 0500) BP: (111-143)/(54-75) 143/75 mmHg (01/15 0500) SpO2:  [94 %-98 %] 94 % (01/15 0500) Weight change:  Last BM Date: 11/06/11  Intake/Output from previous day: 01/14 0701 - 01/15 0700 In: 1872.3 [P.O.:720; I.V.:1152.3] Out: 3300 [Urine:3300] Intake/Output this shift: Total I/O In: 733.3 [I.V.:733.3] Out: 2200 [Urine:2200]  General appearance: alert and cooperative Resp: clear to auscultation bilaterally Cardio: regular rate and rhythm, S1, S2 normal, no murmur, click, rub or gallop Extremities: extremities normal, atraumatic, no cyanosis or edema  Lab Results:  [facility] 11/09/11 0528 11/08/11 0637  WBC 4.2 5.2  HGB 11.2* 10.7*  HCT 35.1* 33.5*  PLT 224 224   BMET  Basename 11/07/11 1719 11/07/11 1019  NA -- 137  K 4.2 5.4*  CL -- 100  CO2 -- 27  GLUCOSE -- 165*  BUN -- 11  CREATININE -- 0.84  CALCIUM -- 9.8    Studies/Results: Ct Abdomen Pelvis W Contrast  11/07/2011  *RADIOLOGY REPORT*  Clinical Data: Abdominal pain and vomiting.  History of gastric surgery for ulcers.  Flank pain.  History of myocardial infarction and chronic kidney disease.  CT ABDOMEN AND PELVIS WITH CONTRAST  Technique:  Multidetector CT imaging of the abdomen and pelvis was performed following the standard protocol during bolus administration of intravenous contrast.  Contrast: OMNIPAQUE IOHEXOL 300 MG/ML IV SOLN  Comparison: Acute abdomen series earlier in the day.  CT angio of 10/08/2008.  Findings: Bibasilar opacities.  Likely atelectasis on the right. Suspect mild fibrosis at the right lung base.  Minimally increased since 2009.  Mild cardiomegaly with coronary artery atherosclerosis.  Numerous hepatic cysts.  Normal spleen.  Small hiatal  hernia, with surgical changes at the gastroesophageal junction.  Question prior Nissen fundoplication.  Moderate pancreatic atrophy with pancreatic ductal dilatation, unchanged.  No obstructive cause identified.  Cholecystectomy.  Normal gallbladder.  Common duct measures upper normal for prior cholecystectomy state, 8 mm.  This also similar. Normal adrenal glands.  Bilateral left greater than right renal cysts.  Interpolar left renal lesion measures 47 HU and 1.0 cm on portal venous phase image 29.  44 HU on delayed images.  Status post aortic Endograft repair.  No evidence of endoleak.  No retroperitoneal or retrocrural adenopathy.  Beam hardening artifact degrades evaluation of the pelvis, secondary to left hip arthroplasty.  The descending colon is relatively decompressed.  The appendix is not confidently identified.  Proximal small bowel loops are normal in caliber and primarily positioned within the right side the abdomen.  Fluid- filled bowel loops are identified within the mid small bowel. These are dilated, measuring up to 3.3 cm.  A "small bowel feces sign" is identified consistent with a component of chronic obstruction.  The bowel undergoes a transition between dilated and decompressed on images 55 - 54.  There is mesenteric edema within this area.  Small bowel in the region of the transition has hypoenhancing walls (example image 55) compared to more proximal small bowel loops.  No pneumatosis or free intraperitoneal air identified.  Native right common iliac artery aneurysm is unchanged at 3.1 cm on image 57.  Limited evaluation for pelvic adenopathy, secondary to beam hardening artifact.  Both iliac limbs of the endograft enhance  normally.  Grossly normal urinary bladder, with mild prostatomegaly.  Small volume minimally complex cul-de-sac fluid on image 71. No acute osseous abnormality.  IMPRESSION:  1.  Partial small bowel obstruction.  Mid small bowel level. Given concurrent mesenteric edema and  small volume complex pelvic fluid in the region of the dilated bowel loops.  Complicating ischemia and internal hernia cannot be entirely excluded. I personally called and discussed this report with Irving Burton, p.a.  at 4:25 p.m. on 11/07/2011. 2.  Similar appearance of aortic Endograft repair without acute complication. 3.  Left renal lesion which is not a simple cyst.  Either a complex cyst or a solid neoplasm.  Pre and post contrast non emergent dedicated renal CT or MRI should be considered.  Original Report Authenticated By: Consuello Bossier, M.D.   Dg Abd Acute W/chest  11/08/2011  *RADIOLOGY REPORT*  Clinical Data: Small bowel obstruction.  ACUTE ABDOMEN SERIES (ABDOMEN 2 VIEW & CHEST 1 VIEW)  Comparison: 11/07/2011  Findings: There are patchy densities at the lung bases which have not significantly changed.  Upper lungs are clear.  Heart and mediastinum are within normal limits.  No evidence to suggest free air.  There is an aortic stent graft present.  There is now oral contrast within the colon.  There is a decreased small bowel dilatation.  There are still a few loops of dilated small bowel in the left upper abdomen.  Left total hip replacement.  IMPRESSION: Decreased small bowel distention with oral contrast in the colon. The findings suggest improving or resolving small bowel obstruction.  Patchy densities at the lung bases probably related to areas of atelectasis.  Original Report Authenticated By: Richarda Overlie, M.D.   Dg Abd Acute W/chest  11/07/2011  *RADIOLOGY REPORT*  Clinical Data: Nausea and abdominal pain.  ACUTE ABDOMEN SERIES (ABDOMEN 2 VIEW & CHEST 1 VIEW)  Comparison: Abdominal pelvic CT of 10/08/2008.  Plain film chest of 02/23/2008.  Findings: Frontal view of the chest demonstrates underlying hyperinflation/COPD. Midline trachea.  Normal heart size.  Tortuous thoracic aorta.  Biapical pleural thickening. No pleural effusion or pneumothorax.  Patchy areas of lower lobe atelectasis or scarring.   Abominal films demonstrate no free intraperitoneal air on upright positioning.  Numerous air-fluid levels within small bowel.  Small bowel is dilated, measuring up to 4.5 cm.  There is gas and stool within the colon.  No pneumatosis.  Prior aortic/common iliac artery stent graft placement.  Left hip arthroplasty.  Stool within the rectum.  Vascular calcifications.  IMPRESSION:  1. Findings suspicious for partial small bowel obstruction.  Focal adynamic ileus could look similar.  No complicating feature. 2.  Hyperinflation/COPD, without acute cardiopulmonary disease.  Original Report Authenticated By: Consuello Bossier, M.D.    Medications: I have reviewed the patient's current medications.  Assessment/Plan: Principal Problem:  *SBO (small bowel obstruction) improved.  Advance to solid diet, D/C IVFs,miralax, ambulate, possible discharge in am Active Problems:  CAD (coronary artery disease) stable  Hypertension ok  Renal mass, left  Outpatient workup   LOS: 2 days   Robert [name] 11/09/2011, 6:40 AM

## 2011-11-09 NOTE — Progress Notes (Signed)
Subjective: Passing flatus, feels well. No N/V. Tolerated clears OOB/walking  Objective: Vital signs in last 24 hours: Temp:  [97.5 F (36.4 C)-98.2 F (36.8 C)] 98.2 F (36.8 C) (01/15 0500) Pulse Rate:  [47-76] 47  (01/15 0500) Resp:  [18-20] 20  (01/15 0500) BP: (111-143)/(54-75) 143/75 mmHg (01/15 0500) SpO2:  [94 %-98 %] 94 % (01/15 0500) Last BM Date: 11/06/11  Intake/Output this shift: Total I/O In: -  Out: 500 [Urine:500]  Physical Exam: BP 143/75  Pulse 47  Temp(Src) 98.2 F (36.8 C) (Oral)  Resp 20  Ht 5\' 11"  (1.803 m)  Wt 72.53 kg (159 lb 14.4 oz)  BMI 22.30 kg/m2  SpO2 94% Abdomen: soft, ND, NT  Labs: CBC  Basename 11/09/11 0528 11/08/11 0637  WBC 4.2 5.2  HGB 11.2* 10.7*  HCT 35.1* 33.5*  PLT 224 224   BMET  Basename 11/09/11 0528 11/07/11 1719 11/07/11 1019  NA 142 -- 137  K 3.8 4.2 --  CL 105 -- 100  CO2 28 -- 27  GLUCOSE 129* -- 165*  BUN 7 -- 11  CREATININE 0.85 -- 0.84  CALCIUM 8.9 -- 9.8   LFT  Basename 11/07/11 1019  PROT 8.1  ALBUMIN 3.9  AST 18  ALT 11  ALKPHOS 74  BILITOT 0.5  BILIDIR --  IBILI --  LIPASE 24   PT/INR  Basename 11/07/11 2326  LABPROT 14.0  INR 1.06   ABG No results found for this basename: PHART:2,PCO2:2,PO2:2,HCO3:2 in the last 72 hours  Studies/Results: Ct Abdomen Pelvis W Contrast  11/07/2011  *RADIOLOGY REPORT*  Clinical Data: Abdominal pain and vomiting.  History of gastric surgery for ulcers.  Flank pain.  History of myocardial infarction and chronic kidney disease.  CT ABDOMEN AND PELVIS WITH CONTRAST  Technique:  Multidetector CT imaging of the abdomen and pelvis was performed following the standard protocol during bolus administration of intravenous contrast.  Contrast: OMNIPAQUE IOHEXOL 300 MG/ML IV SOLN  Comparison: Acute abdomen series earlier in the day.  CT angio of 10/08/2008.  Findings: Bibasilar opacities.  Likely atelectasis on the right. Suspect mild fibrosis at the right  lung base.  Minimally increased since 2009.  Mild cardiomegaly with coronary artery atherosclerosis.  Numerous hepatic cysts.  Normal spleen.  Small hiatal hernia, with surgical changes at the gastroesophageal junction.  Question prior Nissen fundoplication.  Moderate pancreatic atrophy with pancreatic ductal dilatation, unchanged.  No obstructive cause identified.  Cholecystectomy.  Normal gallbladder.  Common duct measures upper normal for prior cholecystectomy state, 8 mm.  This also similar. Normal adrenal glands.  Bilateral left greater than right renal cysts.  Interpolar left renal lesion measures 47 HU and 1.0 cm on portal venous phase image 29.  44 HU on delayed images.  Status post aortic Endograft repair.  No evidence of endoleak.  No retroperitoneal or retrocrural adenopathy.  Beam hardening artifact degrades evaluation of the pelvis, secondary to left hip arthroplasty.  The descending colon is relatively decompressed.  The appendix is not confidently identified.  Proximal small bowel loops are normal in caliber and primarily positioned within the right side the abdomen.  Fluid- filled bowel loops are identified within the mid small bowel. These are dilated, measuring up to 3.3 cm.  A "small bowel feces sign" is identified consistent with a component of chronic obstruction.  The bowel undergoes a transition between dilated and decompressed on images 55 - 54.  There is mesenteric edema within this area.  Small bowel in the  region of the transition has hypoenhancing walls (example image 55) compared to more proximal small bowel loops.  No pneumatosis or free intraperitoneal air identified.  Native right common iliac artery aneurysm is unchanged at 3.1 cm on image 57.  Limited evaluation for pelvic adenopathy, secondary to beam hardening artifact.  Both iliac limbs of the endograft enhance normally.  Grossly normal urinary bladder, with mild prostatomegaly.  Small volume minimally complex cul-de-sac fluid on  image 71. No acute osseous abnormality.  IMPRESSION:  1.  Partial small bowel obstruction.  Mid small bowel level. Given concurrent mesenteric edema and small volume complex pelvic fluid in the region of the dilated bowel loops.  Complicating ischemia and internal hernia cannot be entirely excluded. I personally called and discussed this report with Irving Burton, p.a.  at 4:25 p.m. on 11/07/2011. 2.  Similar appearance of aortic Endograft repair without acute complication. 3.  Left renal lesion which is not a simple cyst.  Either a complex cyst or a solid neoplasm.  Pre and post contrast non emergent dedicated renal CT or MRI should be considered.  Original Report Authenticated By: Consuello Bossier, M.D.   Dg Abd 2 Views  11/09/2011  *RADIOLOGY REPORT*  Clinical Data: Partial small bowel obstruction.  ABDOMEN - 2 VIEW  Comparison:  11/08/2011  Findings: Further decrease in small bowel distention.  Oral contrast material again noted within the colon which is decompressed.  Changes of prior stent graft repair of abdominal aortic aneurysm.  Left hip replacement.  IMPRESSION: Resolution of the small bowel obstruction pattern.  No current small bowel dilatation.  Original Report Authenticated By: Cyndie Chime, M.D.   Dg Abd Acute W/chest  11/08/2011  *RADIOLOGY REPORT*  Clinical Data: Small bowel obstruction.  ACUTE ABDOMEN SERIES (ABDOMEN 2 VIEW & CHEST 1 VIEW)  Comparison: 11/07/2011  Findings: There are patchy densities at the lung bases which have not significantly changed.  Upper lungs are clear.  Heart and mediastinum are within normal limits.  No evidence to suggest free air.  There is an aortic stent graft present.  There is now oral contrast within the colon.  There is a decreased small bowel dilatation.  There are still a few loops of dilated small bowel in the left upper abdomen.  Left total hip replacement.  IMPRESSION: Decreased small bowel distention with oral contrast in the colon. The findings suggest  improving or resolving small bowel obstruction.  Patchy densities at the lung bases probably related to areas of atelectasis.  Original Report Authenticated By: Richarda Overlie, M.D.   Dg Abd Acute W/chest  11/07/2011  *RADIOLOGY REPORT*  Clinical Data: Nausea and abdominal pain.  ACUTE ABDOMEN SERIES (ABDOMEN 2 VIEW & CHEST 1 VIEW)  Comparison: Abdominal pelvic CT of 10/08/2008.  Plain film chest of 02/23/2008.  Findings: Frontal view of the chest demonstrates underlying hyperinflation/COPD. Midline trachea.  Normal heart size.  Tortuous thoracic aorta.  Biapical pleural thickening. No pleural effusion or pneumothorax.  Patchy areas of lower lobe atelectasis or scarring.  Abominal films demonstrate no free intraperitoneal air on upright positioning.  Numerous air-fluid levels within small bowel.  Small bowel is dilated, measuring up to 4.5 cm.  There is gas and stool within the colon.  No pneumatosis.  Prior aortic/common iliac artery stent graft placement.  Left hip arthroplasty.  Stool within the rectum.  Vascular calcifications.  IMPRESSION:  1. Findings suspicious for partial small bowel obstruction.  Focal adynamic ileus could look similar.  No complicating feature. 2.  Hyperinflation/COPD, without acute cardiopulmonary disease.  Original Report Authenticated By: Consuello Bossier, M.D.    Assessment: Principal Problem:  *SBO (small bowel obstruction) Active Problems:  CAD (coronary artery disease)  Hypertension  Renal mass, left     Plan: Agree with advanced diet and Miralax, no surgical indication. Call if needed.  LOS: 2 days    Marianna Fuss 11/09/2011

## 2011-11-10 MED ORDER — POLYETHYLENE GLYCOL 3350 17 G PO PACK: 17.0000 g | PACK | Freq: Two times a day (BID) | ORAL | Status: AC

## 2011-11-10 NOTE — Progress Notes (Signed)
Discharged home. Home discharge instruction given, no questions verbalized. Patient is alert and oriented, not in any distress, denies pain.

## 2011-11-10 NOTE — Discharge Summary (Signed)
Physician Discharge Summary  Patient ID: Robert [name] MRN: [id number] DOB/AGE: [birth year] 78 y.o.  Admit date: 11/07/2011 Discharge date: 11/10/2011  Admission Diagnoses: Partial small bowel obstruction Coronary disease Hypertension  Discharge Diagnoses:  Principal Problem:  *SBO (small bowel obstruction) Active Problems:  CAD (coronary artery disease)  Hypertension  Renal mass, left   Discharged Condition: good  Hospital Course:  The patient was admitted on January 13 with abdominal pain and nausea. The pain is fairly severe. He vomited margarine. He denied flatus. In emergency rooms have a small bowel obstruction. A CT scan of the abdomen and pelvis showed a partial small bowel obstruction with small bowel level, aortic endograft repair without complication and a left renal lesion which was not a simple cyst, possibly complex cyst or solid neoplasm. Her pre-and postcontrast nonemergent dedicated renal CT or MRI was recommended. The patient was treated conservatively with n.p.o. and IV fluids. Lactic acid level was normal. By the next hospital morning the patient felt much better with resolution of abdominal pain, distention and no nausea. His abdominal x-ray showed improvement. He was started on clear liquids which he tolerated. The next day his abdominal x-ray was normal. He was placed on solid diet and again tolerated this without abdominal pain or nausea. He was given MiraLAX for constipation. He was discharged in good condition. He will need to have his renal lesion followed up as an outpatient.  CODE STATUS full code Diet low sodium Activity as tolerated  Consults: general surgery  Significant Diagnostic Studies: labs: Multiple and radiology: KUB: Partial small bowel obstruction and CT scan: As above  Treatments: IV hydration and analgesia: Morphine  Discharge Exam: Blood pressure 120/63, pulse 51, temperature 98 F (36.7 C), temperature source Oral, resp. rate 18, height  5\' 11"  (1.803 m), weight 72.53 kg (159 lb 14.4 oz), SpO2 97.00%. GI: soft, non-tender; bowel sounds normal; no masses,  no organomegaly  Disposition:   Discharge Orders    Future Appointments: Provider: Department: Dept Phone: Center:   07/18/2012 1:30 PM Pryor Ochoa, MD [facility] [phone] [facility]     Medication List  As of 11/10/2011  7:27 AM   TAKE these medications         alendronate 70 MG tablet   Commonly known as: FOSAMAX   Take 70 mg by mouth every 7 (seven) days. Take with a full glass of water on an empty stomach. Friday      clopidogrel 75 MG tablet   Commonly known as: PLAVIX   Take 75 mg by mouth daily.      COMBIGAN 0.2-0.5 % ophthalmic solution   Generic drug: brimonidine-timolol   Place 1 drop into both eyes daily.      finasteride 5 MG tablet   Commonly known as: PROSCAR   Take 5 mg by mouth daily.      furosemide 20 MG tablet   Commonly known as: LASIX   Take 20 mg by mouth daily.      latanoprost 0.005 % ophthalmic solution   Commonly known as: XALATAN   Place 1 drop into the left eye daily.      lisinopril 20 MG tablet   Commonly known as: PRINIVIL,ZESTRIL   Take 20 mg by mouth daily.      polyethylene glycol packet   Commonly known as: MIRALAX / GLYCOLAX   Take 17 g by mouth 2 (two) times daily.      simvastatin 20 MG tablet   Commonly known as: ZOCOR  Take 20 mg by mouth at bedtime.           Follow-up Information    Follow up with Lillia Mountain, MD in 2 weeks.   Contact information:   [address], [address] [employer] [place], [place]. [place] [address] [phone]          Signed: KAELUM, KISSICK 11/10/2011, 7:27 AM

## 2011-11-11 NOTE — Progress Notes (Signed)
Rec outpt f/u with PCP regarding renal lesion.  [name]. Andrey Campanile, MD, FACS General, Bariatric, & Minimally Invasive Surgery [facility], [place]

## 2011-11-11 NOTE — ED Provider Notes (Signed)
Medical screening examination/treatment/procedure(s) were conducted as a shared visit with non-physician practitioner(s) and myself.  I personally evaluated the patient during the encounter   Suzi Roots, MD 11/11/11 [phone]

## 2012-02-09 DIAGNOSIS — R079 Chest pain, unspecified: Secondary | ICD-10-CM | POA: Diagnosis not present

## 2012-02-09 DIAGNOSIS — E782 Mixed hyperlipidemia: Secondary | ICD-10-CM | POA: Diagnosis not present

## 2012-02-09 DIAGNOSIS — I2510 Atherosclerotic heart disease of native coronary artery without angina pectoris: Secondary | ICD-10-CM | POA: Diagnosis not present

## 2012-02-09 DIAGNOSIS — I10 Essential (primary) hypertension: Secondary | ICD-10-CM | POA: Diagnosis not present

## 2012-02-10 ENCOUNTER — Other Ambulatory Visit: Payer: Self-pay | Admitting: Cardiology

## 2012-02-10 ENCOUNTER — Ambulatory Visit
Admission: RE | Admit: 2012-02-10 | Discharge: 2012-02-10 | Disposition: A | Discharge status: Home / Self Care | Payer: Medicare Other | Source: Ambulatory Visit | Attending: Cardiology | Admitting: Cardiology

## 2012-02-10 DIAGNOSIS — R079 Chest pain, unspecified: Secondary | ICD-10-CM | POA: Diagnosis not present

## 2012-02-10 DIAGNOSIS — I10 Essential (primary) hypertension: Secondary | ICD-10-CM | POA: Diagnosis not present

## 2012-02-15 ENCOUNTER — Emergency Department (HOSPITAL_COMMUNITY)
Admission: EM | Admit: 2012-02-15 | Discharge: 2012-02-15 | Disposition: A | Discharge status: Home / Self Care | Payer: Medicare Other | Attending: Emergency Medicine | Admitting: Emergency Medicine

## 2012-02-15 ENCOUNTER — Emergency Department (HOSPITAL_COMMUNITY): Payer: Medicare Other

## 2012-02-15 ENCOUNTER — Encounter (HOSPITAL_COMMUNITY): Payer: Self-pay | Admitting: *Deleted

## 2012-02-15 DIAGNOSIS — I440 Atrioventricular block, first degree: Secondary | ICD-10-CM | POA: Diagnosis not present

## 2012-02-15 DIAGNOSIS — R0781 Pleurodynia: Secondary | ICD-10-CM

## 2012-02-15 DIAGNOSIS — I252 Old myocardial infarction: Secondary | ICD-10-CM | POA: Diagnosis not present

## 2012-02-15 DIAGNOSIS — I2510 Atherosclerotic heart disease of native coronary artery without angina pectoris: Secondary | ICD-10-CM | POA: Insufficient documentation

## 2012-02-15 DIAGNOSIS — J209 Acute bronchitis, unspecified: Secondary | ICD-10-CM | POA: Diagnosis not present

## 2012-02-15 DIAGNOSIS — J8410 Pulmonary fibrosis, unspecified: Secondary | ICD-10-CM | POA: Diagnosis not present

## 2012-02-15 DIAGNOSIS — J438 Other emphysema: Secondary | ICD-10-CM | POA: Diagnosis not present

## 2012-02-15 DIAGNOSIS — Z87891 Personal history of nicotine dependence: Secondary | ICD-10-CM | POA: Insufficient documentation

## 2012-02-15 DIAGNOSIS — E785 Hyperlipidemia, unspecified: Secondary | ICD-10-CM | POA: Insufficient documentation

## 2012-02-15 DIAGNOSIS — R05 Cough: Secondary | ICD-10-CM | POA: Insufficient documentation

## 2012-02-15 DIAGNOSIS — J40 Bronchitis, not specified as acute or chronic: Secondary | ICD-10-CM | POA: Diagnosis not present

## 2012-02-15 DIAGNOSIS — R071 Chest pain on breathing: Secondary | ICD-10-CM | POA: Insufficient documentation

## 2012-02-15 DIAGNOSIS — R0789 Other chest pain: Secondary | ICD-10-CM

## 2012-02-15 DIAGNOSIS — I10 Essential (primary) hypertension: Secondary | ICD-10-CM | POA: Diagnosis not present

## 2012-02-15 DIAGNOSIS — R059 Cough, unspecified: Secondary | ICD-10-CM | POA: Diagnosis not present

## 2012-02-15 DIAGNOSIS — R079 Chest pain, unspecified: Secondary | ICD-10-CM | POA: Diagnosis not present

## 2012-02-15 DIAGNOSIS — J449 Chronic obstructive pulmonary disease, unspecified: Secondary | ICD-10-CM | POA: Diagnosis not present

## 2012-02-15 DIAGNOSIS — J9819 Other pulmonary collapse: Secondary | ICD-10-CM | POA: Diagnosis not present

## 2012-02-15 LAB — CBC
HCT: 37.6 % — ABNORMAL LOW (ref 39.0–52.0)
Hemoglobin: 12.2 g/dL — ABNORMAL LOW (ref 13.0–17.0)
MCH: 26.1 pg (ref 26.0–34.0)
MCHC: 32.4 g/dL (ref 30.0–36.0)
MCV: 80.3 fL (ref 78.0–100.0)
RDW: 16.3 % — ABNORMAL HIGH (ref 11.5–15.5)

## 2012-02-15 LAB — COMPREHENSIVE METABOLIC PANEL
ALT: 10 U/L (ref 0–53)
AST: 18 U/L (ref 0–37)
CO2: 27 mEq/L (ref 19–32)
Chloride: 101 mEq/L (ref 96–112)
Creatinine, Ser: 0.83 mg/dL (ref 0.50–1.35)
GFR calc Af Amer: >90 mL/min (ref >90)
GFR calc non Af Amer: 83 mL/min — ABNORMAL LOW (ref >90)
Glucose, Bld: 140 mg/dL — ABNORMAL HIGH (ref 70–99)
Total Bilirubin: 0.3 mg/dL (ref 0.3–1.2)

## 2012-02-15 LAB — DIFFERENTIAL
Basophils Relative: 0 % (ref 0–1)
Eosinophils Relative: 2 % (ref 0–5)
Monocytes Absolute: 0.4 10*3/uL (ref 0.1–1.0)
Monocytes Relative: 8 % (ref 3–12)
Neutro Abs: 3.2 10*3/uL (ref 1.7–7.7)

## 2012-02-15 LAB — CK TOTAL AND CKMB (NOT AT ARMC): Relative Index: 1.8 (ref 0.0–2.5)

## 2012-02-15 MED ORDER — IOHEXOL 350 MG/ML SOLN
100.0000 mL | Freq: Once | INTRAVENOUS | Status: AC | PRN
  Administered 2012-02-15: 100 mL via INTRAVENOUS

## 2012-02-15 MED ORDER — KETOROLAC TROMETHAMINE 30 MG/ML IJ SOLN
30.0000 mg | Freq: Once | INTRAMUSCULAR | Status: AC
  Administered 2012-02-15: 30 mg via INTRAVENOUS
  Filled 2012-02-15: qty 1

## 2012-02-15 MED ORDER — AMOXICILLIN 500 MG PO CAPS
500.0000 mg | ORAL_CAPSULE | Freq: Once | ORAL | Status: AC
  Administered 2012-02-15: 500 mg via ORAL
  Filled 2012-02-15: qty 1

## 2012-02-15 MED ORDER — IBUPROFEN 600 MG PO TABS: 600.0000 mg | ORAL_TABLET | Freq: Three times a day (TID) | ORAL | Status: AC | PRN

## 2012-02-15 MED ORDER — PREDNISONE 20 MG PO TABS
60.0000 mg | ORAL_TABLET | ORAL | Status: AC
  Administered 2012-02-15: 60 mg via ORAL
  Filled 2012-02-15: qty 3

## 2012-02-15 MED ORDER — CYCLOBENZAPRINE HCL 10 MG PO TABS
10.0000 mg | ORAL_TABLET | Freq: Once | ORAL | Status: AC
  Administered 2012-02-15: 10 mg via ORAL
  Filled 2012-02-15: qty 1

## 2012-02-15 MED ORDER — AMOXICILLIN 500 MG PO CAPS: 500.0000 mg | ORAL_CAPSULE | Freq: Three times a day (TID) | ORAL | Status: AC

## 2012-02-15 MED ORDER — CYCLOBENZAPRINE HCL 10 MG PO TABS: 10.0000 mg | ORAL_TABLET | Freq: Three times a day (TID) | ORAL | Status: AC | PRN

## 2012-02-15 NOTE — ED Notes (Signed)
The pt has had lt upper chest pain for one week .  During this time he has seen his doctor for the samw

## 2012-02-15 NOTE — ED Notes (Signed)
Pt back on monitor

## 2012-02-15 NOTE — ED Provider Notes (Signed)
History     CSN: [id number]  Arrival date & time 02/15/12  0017   First MD Initiated Contact with Patient 02/15/12 0041      Chief Complaint  Patient presents with  . Chest Pain    (Consider location/radiation/quality/duration/timing/severity/associated sxs/prior treatment) HPI  Patient relates he has been having "chest cramps" in his left upper chest wall off and on since last week. He relates he only feels it when he breathes in or if he coughs. The pain only lasted a few seconds. He has had a dry cough without fever or shortness of breath. He denies nausea, vomiting, diarrhea, or sore throat. He also denies wheezing. He states he has chronic rhinorrhea. He states he's never had this pain before. He was seen by his cardiologist's PA Corine Shelter 3 days ago for this pain. He states doesn't know what he was told is wrong. Patient has a history of coronary artery disease but states this pain is much different.  PCP Dr Kirby Funk Cardiologist Dr Rennis Golden  Past Medical History  Diagnosis Date  . Hypertension   . Hyperlipidemia   . Myocardial infarction   . CAD (coronary artery disease)   . Ulcer     Past Surgical History  Procedure Date  . Eye surgery cataract surgery right eye  . Joint replacement left hip replacement  . Gastrectomy 50 yrs ago - 2 operations    had surgery at the age of 7 for the ulcer  . Coronary angioplasty with stent placement 20 yrs ago    Dr Elsie Lincoln  . Iliac artery aneurysm repair     Repaied by Dr. Hart Rochester april 2009    Family History  Problem Relation Age of Onset  . Stroke Father     History  Substance Use Topics  . Smoking status: Former Smoker -- .5 years    Types: Cigarettes  . Smokeless tobacco: Not on file   Comment: quit around HA time  . Alcohol Use: No  daughter lives with him    Review of Systems  All other systems reviewed and are negative.    Allergies  Asa w/codeine  Home Medications   Current Outpatient Rx  Name  Route Sig Dispense Refill  . ALENDRONATE SODIUM 70 MG PO TABS Oral Take 70 mg by mouth every 7 (seven) days. Take with a full glass of water on an empty stomach. Friday    . BRIMONIDINE TARTRATE-TIMOLOL 0.2-0.5 % OP SOLN Both Eyes Place 1 drop into both eyes daily.    Marland Kitchen CLOPIDOGREL BISULFATE 75 MG PO TABS Oral Take 75 mg by mouth daily.      Marland Kitchen FINASTERIDE 5 MG PO TABS Oral Take 5 mg by mouth daily.      . FUROSEMIDE 20 MG PO TABS Oral Take 20 mg by mouth daily.     Marland Kitchen LATANOPROST 0.005 % OP SOLN Left Eye Place 1 drop into the left eye daily.    Marland Kitchen LISINOPRIL 20 MG PO TABS Oral Take 20 mg by mouth daily.      Marland Kitchen SIMVASTATIN 20 MG PO TABS Oral Take 20 mg by mouth at bedtime.        BP 137/85  Pulse 52  Temp(Src) 97.7 F (36.5 C) (Oral)  Resp 22  SpO2 98%  Vital signs normal except bradycardia   Physical Exam  Constitutional: He is oriented to person, place, and time. He appears well-developed and well-nourished.  Non-toxic appearance. He does not appear ill. No distress.  HENT:  Head: Normocephalic and atraumatic.  Right Ear: External ear normal.  Left Ear: External ear normal.  Nose: Nose normal. No mucosal edema or rhinorrhea.  Mouth/Throat: Oropharynx is clear and moist and mucous membranes are normal. No dental abscesses or uvula swelling.  Eyes: Conjunctivae and EOM are normal. Pupils are equal, round, and reactive to light.  Neck: Normal range of motion and full passive range of motion without pain. Neck supple.  Cardiovascular: Normal rate, regular rhythm and normal heart sounds.  Exam reveals no gallop and no friction rub.   No murmur heard. Pulmonary/Chest: Effort normal and breath sounds normal. No respiratory distress. He has no wheezes. He has no rhonchi. He has no rales. He exhibits tenderness. He exhibits no crepitus.    Abdominal: Soft. Normal appearance and bowel sounds are normal. He exhibits no distension. There is no tenderness. There is no rebound and no guarding.    Musculoskeletal: Normal range of motion. He exhibits no edema and no tenderness.       Moves all extremities well.   Neurological: He is alert and oriented to person, place, and time. He has normal strength. No cranial nerve deficit.  Skin: Skin is warm, dry and intact. No rash noted. No erythema. No pallor.  Psychiatric: He has a normal mood and affect. His speech is normal and behavior is normal. His mood appears not anxious.    ED Course  Procedures (including critical care time)   Medications  ketorolac (TORADOL) 30 MG/ML injection 30 mg (not administered)  cyclobenzaprine (FLEXERIL) tablet 10 mg (10 mg Oral Given 02/15/12 0112)  predniSONE (DELTASONE) tablet 60 mg (60 mg Oral Given 02/15/12 0112)  iohexol (OMNIPAQUE) 350 MG/ML injection 100 mL (100 mL Intravenous Contrast Given 02/15/12 0223)   PT states his pain was better then started to return.     Results for orders placed during the hospital encounter of 02/15/12  CBC      Component Value Range   WBC 5.9  4.0 - 10.5 (K/uL)   RBC 4.68  4.22 - 5.81 (MIL/uL)   Hemoglobin 12.2 (*) 13.0 - 17.0 (g/dL)   HCT 13.0 (*) 86.5 - 52.0 (%)   MCV 80.3  78.0 - 100.0 (fL)   MCH 26.1  26.0 - 34.0 (pg)   MCHC 32.4  30.0 - 36.0 (g/dL)   RDW 78.4 (*) 69.6 - 15.5 (%)   Platelets 280  150 - 400 (K/uL)  DIFFERENTIAL      Component Value Range   Neutrophils Relative 54  43 - 77 (%)   Neutro Abs 3.2  1.7 - 7.7 (K/uL)   Lymphocytes Relative 37  12 - 46 (%)   Lymphs Abs 2.2  0.7 - 4.0 (K/uL)   Monocytes Relative 8  3 - 12 (%)   Monocytes Absolute 0.4  0.1 - 1.0 (K/uL)   Eosinophils Relative 2  0 - 5 (%)   Eosinophils Absolute 0.1  0.0 - 0.7 (K/uL)   Basophils Relative 0  0 - 1 (%)   Basophils Absolute 0.0  0.0 - 0.1 (K/uL)  CK TOTAL AND CKMB      Component Value Range   Total CK 102  7 - 232 (U/L)   CK, MB 1.8  0.3 - 4.0 (ng/mL)   Relative Index 1.8  0.0 - 2.5   COMPREHENSIVE METABOLIC PANEL      Component Value Range   Sodium 139   135 - 145 (mEq/L)   Potassium 3.8  3.5 -  5.1 (mEq/L)   Chloride 101  96 - 112 (mEq/L)   CO2 27  19 - 32 (mEq/L)   Glucose, Bld 140 (*) 70 - 99 (mg/dL)   BUN 13  6 - 23 (mg/dL)   Creatinine, Ser 1.61  0.50 - 1.35 (mg/dL)   Calcium 8.8  8.4 - 09.6 (mg/dL)   Total Protein 7.3  6.0 - 8.3 (g/dL)   Albumin 3.5  3.5 - 5.2 (g/dL)   AST 18  0 - 37 (U/L)   ALT 10  0 - 53 (U/L)   Alkaline Phosphatase 70  39 - 117 (U/L)   Total Bilirubin 0.3  0.3 - 1.2 (mg/dL)   GFR calc non Af Amer 83 (*) >90 (mL/min)   GFR calc Af Amer >90  >90 (mL/min)  POCT I-STAT TROPONIN I      Component Value Range   Troponin i, poc 0.01  0.00 - 0.08 (ng/mL)   Comment 3           D-DIMER, QUANTITATIVE      Component Value Range   D-Dimer, Quant 1.28 (*) 0.00 - 0.48 (ug/mL-FEU)   Laboratory interpretation all normal except elevated d-dimer, mild anemia  Dg Chest 2 View  02/10/2012  *RADIOLOGY REPORT*  Clinical Data: Left upper anterior chest pain for 3 weeks, hypertension  CHEST - 2 VIEW  Comparison: Portable chest x-ray of 02/23/2008  Findings: The lungs are clear but hyperaerated consistent with COPD.  Mediastinal contours are stable.  The heart is within upper limits of normal.  No bony abnormality is seen. Surgical clips are present around the GE junction.  IMPRESSION: Hyperaeration consistent with COPD.  No active lung disease.  Original Report Authenticated By: Juline Patch, M.D.    Dg Chest 2 View  02/15/2012  *RADIOLOGY REPORT*  Clinical Data: Chest pain and cough  CHEST - 2 VIEW  Comparison: 02/10/2012  Findings: Hyperaeration and coarsening of the interstitial markings compatible with COPD again noted.  Lungs are slightly less well aerated, with bibasilar linear atelectasis.  No focal pulmonary opacity otherwise.  Epigastric clips are in place.  No pleural effusion.  No pneumothorax.  No new osseous abnormality.  IMPRESSION: Lower lobe hypoaeration with bibasilar atelectasis.  Otherwise stable exam demonstrating  COPD.  Original Report Authenticated By: Harrel Lemon, M.D.   Ct Angio Chest W/cm &/or Wo Cm  02/15/2012  *RADIOLOGY REPORT*  Clinical Data: Left upper chest pain.  Elevated D-dimer.  CT ANGIOGRAPHY CHEST  Technique:  Multidetector CT imaging of the chest using the standard protocol during bolus administration of intravenous contrast. Multiplanar reconstructed images including MIPs were obtained and reviewed to evaluate the vascular anatomy.  Contrast: OMNIPAQUE IOHEXOL 350 MG/ML SOLN  Comparison: 04/09/2007  Findings: The technically adequate study with good opacification of the central and segmental pulmonary arteries.  No focal filling defects.  No evidence of significant pulmonary embolus.  Mild cardiac enlargement.  Coronary artery calcification.  Mild dilatation of the ascending aorta at 4 cm diameter.  Calcification in the aortic arch.  No significant lymphadenopathy in the chest. The esophagus appears to be decompressed.  There is a small loculated gas collection in the mediastinal soft tissues behind the trachea and a second collection in the left paratracheal region over the left main stem bronchus.  These collections are stable since the previous study and likely represent prominent pulmonary tissues or diverticula arising from the esophagus.  Airways appear patent.  Diffuse emphysematous changes and scattered fibrosis throughout  the lungs.  Infiltration or atelectasis in the lung bases.  No pneumothorax.  No pleural effusions.  Images obtained incidentally of the upper abdomen demonstrate multiple hepatic cysts.  Bilateral renal cysts.  Postoperative changes in the EG junction.  Aortic stent.  Changes are stable since the previous study.  IMPRESSION: No significant pulmonary embolus.  Diffuse emphysematous changes and fibrosis in the lungs.  Atelectasis or infiltration in both lung bases.  Multiple hepatic and renal cysts.  Diffuse three- vessel coronary artery calcification.  Original  Report Authenticated By: Marlon Pel, M.D.   #1   Date: 02/15/2012  Rate: 65  Rhythm: normal sinus rhythm  QRS Axis: normal  Intervals: normal  ST/T Wave abnormalities: TWI in inf/lat leads  Conduction Disutrbances:LVH  Narrative Interpretation:   Old EKG Reviewed: unchanged from 11/07/2011  #2   Date: 02/15/2012  Rate: 61  Rhythm: normal sinus rhythm  QRS Axis: left  Intervals: PR prolonged  ST/T Wave abnormalities: TWI in inf/lat leads  Conduction Disutrbances:none  Narrative Interpretation:   Old EKG Reviewed: unchanged from earlier today (better tracing)      1. Chest wall pain   2. Pleuritic chest pain   3. Bronchitis     New Prescriptions   AMOXICILLIN (AMOXIL) 500 MG CAPSULE    Take 1 capsule (500 mg total) by mouth 3 (three) times daily.   CYCLOBENZAPRINE (FLEXERIL) 10 MG TABLET    Take 1 tablet (10 mg total) by mouth 3 (three) times daily as needed for muscle spasms.   IBUPROFEN (ADVIL,MOTRIN) 600 MG TABLET    Take 1 tablet (600 mg total) by mouth every 8 (eight) hours as needed for pain.   Plan discharge  Devoria Albe, MD, FACEP   MDM          Ward Givens, MD 02/15/12 [phone]

## 2012-02-15 NOTE — Discharge Instructions (Signed)
Try heat on your chest, soak in the shower with warm water running on your chest or try a heating pad. Take the medications for your chest wall pain. Take the antibiotic until gone.  Recheck if you get a fever, struggle to breathe or the pain gets constant instead of just when you breathe deeply.n

## 2012-02-15 NOTE — ED Notes (Signed)
Pt stated that he came to the ED because he has been having left upper chest pain. Pain feels like cramping and is moderate. No radiation. No SOB or n/v with pain. Pt stated that he had the exact same pain last week and it went away. Never was checked out by a doctor for the pain. No coughing. Will continue to monitor.

## 2012-02-15 NOTE — ED Notes (Signed)
The pt s pain is worse with inspiration

## 2012-02-22 DIAGNOSIS — R350 Frequency of micturition: Secondary | ICD-10-CM | POA: Diagnosis not present

## 2012-02-22 DIAGNOSIS — N289 Disorder of kidney and ureter, unspecified: Secondary | ICD-10-CM | POA: Diagnosis not present

## 2012-02-23 DIAGNOSIS — I2510 Atherosclerotic heart disease of native coronary artery without angina pectoris: Secondary | ICD-10-CM | POA: Diagnosis not present

## 2012-02-23 DIAGNOSIS — I739 Peripheral vascular disease, unspecified: Secondary | ICD-10-CM | POA: Diagnosis not present

## 2012-02-23 DIAGNOSIS — I10 Essential (primary) hypertension: Secondary | ICD-10-CM | POA: Diagnosis not present

## 2012-02-23 DIAGNOSIS — Z9289 Personal history of other medical treatment: Secondary | ICD-10-CM

## 2012-02-23 DIAGNOSIS — R9431 Abnormal electrocardiogram [ECG] [EKG]: Secondary | ICD-10-CM | POA: Diagnosis not present

## 2012-02-23 HISTORY — DX: Personal history of other medical treatment: Z92.89

## 2012-03-01 DIAGNOSIS — R040 Epistaxis: Secondary | ICD-10-CM | POA: Diagnosis not present

## 2012-03-01 DIAGNOSIS — I2510 Atherosclerotic heart disease of native coronary artery without angina pectoris: Secondary | ICD-10-CM | POA: Diagnosis not present

## 2012-03-01 DIAGNOSIS — N289 Disorder of kidney and ureter, unspecified: Secondary | ICD-10-CM | POA: Diagnosis not present

## 2012-03-01 DIAGNOSIS — I10 Essential (primary) hypertension: Secondary | ICD-10-CM | POA: Diagnosis not present

## 2012-03-01 DIAGNOSIS — R7301 Impaired fasting glucose: Secondary | ICD-10-CM | POA: Diagnosis not present

## 2012-03-02 DIAGNOSIS — R7301 Impaired fasting glucose: Secondary | ICD-10-CM | POA: Diagnosis not present

## 2012-03-02 DIAGNOSIS — R040 Epistaxis: Secondary | ICD-10-CM | POA: Diagnosis not present

## 2012-03-02 DIAGNOSIS — N289 Disorder of kidney and ureter, unspecified: Secondary | ICD-10-CM | POA: Diagnosis not present

## 2012-03-02 DIAGNOSIS — I10 Essential (primary) hypertension: Secondary | ICD-10-CM | POA: Diagnosis not present

## 2012-03-02 DIAGNOSIS — I2510 Atherosclerotic heart disease of native coronary artery without angina pectoris: Secondary | ICD-10-CM | POA: Diagnosis not present

## 2012-03-07 DIAGNOSIS — R079 Chest pain, unspecified: Secondary | ICD-10-CM | POA: Diagnosis not present

## 2012-03-07 DIAGNOSIS — R7301 Impaired fasting glucose: Secondary | ICD-10-CM | POA: Diagnosis not present

## 2012-03-07 DIAGNOSIS — I10 Essential (primary) hypertension: Secondary | ICD-10-CM | POA: Diagnosis not present

## 2012-03-07 DIAGNOSIS — I2510 Atherosclerotic heart disease of native coronary artery without angina pectoris: Secondary | ICD-10-CM | POA: Diagnosis not present

## 2012-03-07 DIAGNOSIS — E782 Mixed hyperlipidemia: Secondary | ICD-10-CM | POA: Diagnosis not present

## 2012-06-08 ENCOUNTER — Other Ambulatory Visit: Payer: Self-pay | Admitting: *Deleted

## 2012-06-08 DIAGNOSIS — Z48812 Encounter for surgical aftercare following surgery on the circulatory system: Secondary | ICD-10-CM

## 2012-06-08 DIAGNOSIS — I723 Aneurysm of iliac artery: Secondary | ICD-10-CM

## 2012-06-28 DIAGNOSIS — H4011X0 Primary open-angle glaucoma, stage unspecified: Secondary | ICD-10-CM | POA: Diagnosis not present

## 2012-06-28 DIAGNOSIS — H409 Unspecified glaucoma: Secondary | ICD-10-CM | POA: Diagnosis not present

## 2012-07-06 DIAGNOSIS — Z23 Encounter for immunization: Secondary | ICD-10-CM | POA: Diagnosis not present

## 2012-07-17 ENCOUNTER — Other Ambulatory Visit: Payer: Self-pay | Admitting: Vascular Surgery

## 2012-07-17 ENCOUNTER — Encounter: Payer: Self-pay | Admitting: Vascular Surgery

## 2012-07-17 DIAGNOSIS — I723 Aneurysm of iliac artery: Secondary | ICD-10-CM | POA: Diagnosis not present

## 2012-07-18 ENCOUNTER — Encounter: Payer: Self-pay | Admitting: Vascular Surgery

## 2012-07-18 ENCOUNTER — Ambulatory Visit (INDEPENDENT_AMBULATORY_CARE_PROVIDER_SITE_OTHER): Payer: Medicare Other | Admitting: Vascular Surgery

## 2012-07-18 ENCOUNTER — Ambulatory Visit
Admission: RE | Admit: 2012-07-18 | Discharge: 2012-07-18 | Disposition: A | Discharge status: Home / Self Care | Payer: Medicare Other | Source: Ambulatory Visit | Attending: Vascular Surgery | Admitting: Vascular Surgery

## 2012-07-18 VITALS — BP 162/94 | HR 66 | Resp 20 | Ht 71.0 in | Wt 167.0 lb

## 2012-07-18 DIAGNOSIS — N281 Cyst of kidney, acquired: Secondary | ICD-10-CM | POA: Diagnosis not present

## 2012-07-18 DIAGNOSIS — Z48812 Encounter for surgical aftercare following surgery on the circulatory system: Secondary | ICD-10-CM | POA: Diagnosis not present

## 2012-07-18 DIAGNOSIS — I714 Abdominal aortic aneurysm, without rupture: Secondary | ICD-10-CM | POA: Insufficient documentation

## 2012-07-18 DIAGNOSIS — I723 Aneurysm of iliac artery: Secondary | ICD-10-CM | POA: Diagnosis not present

## 2012-07-18 MED ORDER — IOHEXOL 350 MG/ML SOLN
80.0000 mL | Freq: Once | INTRAVENOUS | Status: AC | PRN
  Administered 2012-07-18: 80 mL via INTRAVENOUS

## 2012-07-18 NOTE — Progress Notes (Signed)
Subjective:     Patient ID: Robert [name], male   DOB: May 13, 1932, 78 y.o.   MRN: [id number]  HPI this 78 year old male returns in followup regarding his stent graft repair for right iliac artery aneurysm in April of 2009. He has had no abdominal or back symptoms. He did have small bowel obstruction in April of this year which did not require surgery. He denies claudication symptoms.  Past Medical History  Diagnosis Date  . Hypertension   . Hyperlipidemia   . Myocardial infarction   . CAD (coronary artery disease)   . Ulcer     History  Substance Use Topics  . Smoking status: Former Smoker -- .5 years    Types: Cigarettes    Quit date: 07/18/1992  . Smokeless tobacco: Never Used   Comment: quit around HA time  . Alcohol Use: No    Family History  Problem Relation Age of Onset  . Stroke Father     Allergies  Allergen Reactions  . Asa W/Codeine (Aspirin-Codeine) Other (See Comments)    Irritates his water system    Current outpatient prescriptions:alendronate (FOSAMAX) 70 MG tablet, Take 70 mg by mouth every 7 (seven) days. Take with a full glass of water on an empty stomach. Friday, Disp: , Rfl: ;  brimonidine-timolol (COMBIGAN) 0.2-0.5 % ophthalmic solution, Place 1 drop into both eyes daily., Disp: , Rfl: ;  clopidogrel (PLAVIX) 75 MG tablet, Take 75 mg by mouth daily.  , Disp: , Rfl:  finasteride (PROSCAR) 5 MG tablet, Take 5 mg by mouth daily.  , Disp: , Rfl: ;  furosemide (LASIX) 20 MG tablet, Take 20 mg by mouth daily. , Disp: , Rfl: ;  latanoprost (XALATAN) 0.005 % ophthalmic solution, Place 1 drop into the left eye daily., Disp: , Rfl: ;  lisinopril (PRINIVIL,ZESTRIL) 20 MG tablet, Take 20 mg by mouth daily.  , Disp: , Rfl: ;  simvastatin (ZOCOR) 20 MG tablet, Take 20 mg by mouth at bedtime.  , Disp: , Rfl:  No current facility-administered medications for this visit. Facility-Administered Medications Ordered in Other Visits: iohexol (OMNIPAQUE) 350 MG/ML injection 80 mL,  80 mL, Intravenous, Once PRN, Medication Radiologist, MD, 80 mL at 07/18/12 1208  BP 162/94  Pulse 66  Resp 20  Ht 5\' 11"  (1.803 m)  Wt 167 lb (75.751 kg)  BMI 23.29 kg/m2  Body mass index is 23.29 kg/(m^2).         Review of Systems denies chest pain,, PND, orthopnea, claudication, hemoptysis, lateralizing weakness, amaurosis fugax. Does have occasional nasal bleeding and weakness in his arms and legs. Also positive for dyspnea on exertion.     Objective:   Physical Exam blood pressure 162/94 heart rate 66 respirations 20 Gen.-alert and oriented x3 in no apparent distress HEENT normal for age Lungs no rhonchi or wheezing Cardiovascular regular rhythm no murmurs carotid pulses 3+ palpable no bruits audible Abdomen soft nontender no palpable masses Musculoskeletal free of  major deformities Skin clear -no rashes Neurologic normal Lower extremities 3+ femoral and dorsalis pedis pulses palpable bilaterally with no edema  Today I ordered a CT angiogram which are reviewed by computer. The aortic biiliac stent graft is in excellent position. The right common iliac artery aneurysm has had no change in dimension at 3.4 cm. Embolization was previously done to the right internal iliac artery which is stable. The graft terminates in the external iliac artery.     Assessment:     Doing well post aortobiiliac stent  graft for right common iliac artery aneurysm    Plan:     Return in 2 years with CT angiogram to continue to follow

## 2012-08-01 DIAGNOSIS — H409 Unspecified glaucoma: Secondary | ICD-10-CM | POA: Diagnosis not present

## 2012-08-01 DIAGNOSIS — H4011X0 Primary open-angle glaucoma, stage unspecified: Secondary | ICD-10-CM | POA: Diagnosis not present

## 2012-08-03 DIAGNOSIS — N289 Disorder of kidney and ureter, unspecified: Secondary | ICD-10-CM | POA: Diagnosis not present

## 2012-08-03 DIAGNOSIS — E785 Hyperlipidemia, unspecified: Secondary | ICD-10-CM | POA: Diagnosis not present

## 2012-08-03 DIAGNOSIS — R7301 Impaired fasting glucose: Secondary | ICD-10-CM | POA: Diagnosis not present

## 2012-08-03 DIAGNOSIS — I10 Essential (primary) hypertension: Secondary | ICD-10-CM | POA: Diagnosis not present

## 2012-08-03 DIAGNOSIS — I252 Old myocardial infarction: Secondary | ICD-10-CM | POA: Diagnosis not present

## 2012-08-17 DIAGNOSIS — N289 Disorder of kidney and ureter, unspecified: Secondary | ICD-10-CM | POA: Diagnosis not present

## 2012-08-17 DIAGNOSIS — N281 Cyst of kidney, acquired: Secondary | ICD-10-CM | POA: Diagnosis not present

## 2012-08-21 DIAGNOSIS — N400 Enlarged prostate without lower urinary tract symptoms: Secondary | ICD-10-CM | POA: Diagnosis not present

## 2012-08-21 DIAGNOSIS — N281 Cyst of kidney, acquired: Secondary | ICD-10-CM | POA: Diagnosis not present

## 2012-11-28 DIAGNOSIS — H409 Unspecified glaucoma: Secondary | ICD-10-CM | POA: Diagnosis not present

## 2012-11-28 DIAGNOSIS — H4011X0 Primary open-angle glaucoma, stage unspecified: Secondary | ICD-10-CM | POA: Diagnosis not present

## 2013-01-19 DIAGNOSIS — E782 Mixed hyperlipidemia: Secondary | ICD-10-CM | POA: Diagnosis not present

## 2013-01-19 DIAGNOSIS — I2510 Atherosclerotic heart disease of native coronary artery without angina pectoris: Secondary | ICD-10-CM | POA: Diagnosis not present

## 2013-01-19 DIAGNOSIS — I10 Essential (primary) hypertension: Secondary | ICD-10-CM | POA: Diagnosis not present

## 2013-02-15 DIAGNOSIS — I10 Essential (primary) hypertension: Secondary | ICD-10-CM | POA: Diagnosis not present

## 2013-02-15 DIAGNOSIS — I2510 Atherosclerotic heart disease of native coronary artery without angina pectoris: Secondary | ICD-10-CM | POA: Diagnosis not present

## 2013-02-15 DIAGNOSIS — I739 Peripheral vascular disease, unspecified: Secondary | ICD-10-CM | POA: Diagnosis not present

## 2013-03-09 DIAGNOSIS — N281 Cyst of kidney, acquired: Secondary | ICD-10-CM | POA: Diagnosis not present

## 2013-03-14 ENCOUNTER — Telehealth: Payer: Self-pay | Admitting: Internal Medicine

## 2013-03-14 NOTE — Telephone Encounter (Signed)
Need new prescription faxed to [facility] for his Lisinopril 40mg -Fax#[phone]-Do you have any samples of this?

## 2013-03-16 NOTE — Telephone Encounter (Signed)
Returned call.  No answer.  No samples of Lisinopril in office.  Will try again on Tuesday, 5.27.14.

## 2013-03-26 DIAGNOSIS — M79609 Pain in unspecified limb: Secondary | ICD-10-CM | POA: Diagnosis not present

## 2013-03-26 DIAGNOSIS — M25559 Pain in unspecified hip: Secondary | ICD-10-CM | POA: Diagnosis not present

## 2013-03-28 DIAGNOSIS — H4011X0 Primary open-angle glaucoma, stage unspecified: Secondary | ICD-10-CM | POA: Diagnosis not present

## 2013-03-28 DIAGNOSIS — H409 Unspecified glaucoma: Secondary | ICD-10-CM | POA: Diagnosis not present

## 2013-04-03 DIAGNOSIS — Z1331 Encounter for screening for depression: Secondary | ICD-10-CM | POA: Diagnosis not present

## 2013-04-03 DIAGNOSIS — I10 Essential (primary) hypertension: Secondary | ICD-10-CM | POA: Diagnosis not present

## 2013-04-03 DIAGNOSIS — Z0000 Encounter for general adult medical examination without abnormal findings: Secondary | ICD-10-CM | POA: Diagnosis not present

## 2013-04-03 DIAGNOSIS — E119 Type 2 diabetes mellitus without complications: Secondary | ICD-10-CM | POA: Diagnosis not present

## 2013-04-03 DIAGNOSIS — M5430 Sciatica, unspecified side: Secondary | ICD-10-CM | POA: Diagnosis not present

## 2013-04-03 DIAGNOSIS — E785 Hyperlipidemia, unspecified: Secondary | ICD-10-CM | POA: Diagnosis not present

## 2013-04-24 IMAGING — CT CT CTA ABD/PEL W/CM AND/OR W/O CM
2 of 10 series · 11 of 46 positions shown, 17 images · IV contrast (80CC OMNI 350)
Comparison: 11/07/2011

CLINICAL DATA: Endograft follow-up.

CT ANGIOGRAPHY ABDOMEN AND PELVIS
TECHNIQUE: Multidetector CT imaging of the abdomen and pelvis was
performed using the standard protocol during bolus administration
of intravenous contrast.  Multiplanar reconstructed images
including MIPs were obtained and reviewed to evaluate the vascular
anatomy.
Contrast: 80mL OMNIPAQUE IOHEXOL 350 MG/ML SOLN

[Series 4: angio · axial · 0.70mm/px · z∈[-308,-10]mm · 9 of 200 slices shown, 15 images]
[im 20/200  soft-tissue]
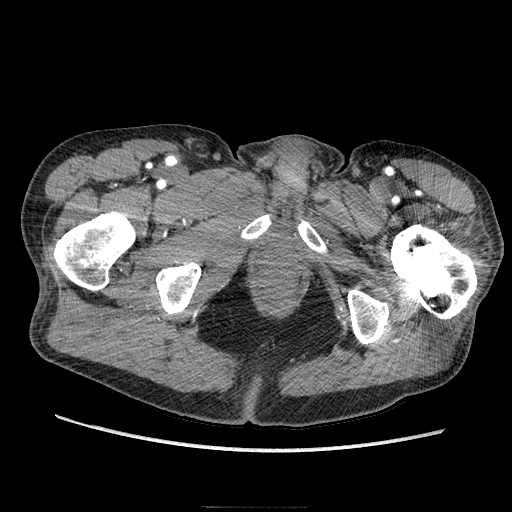
[im 20/200  bone]
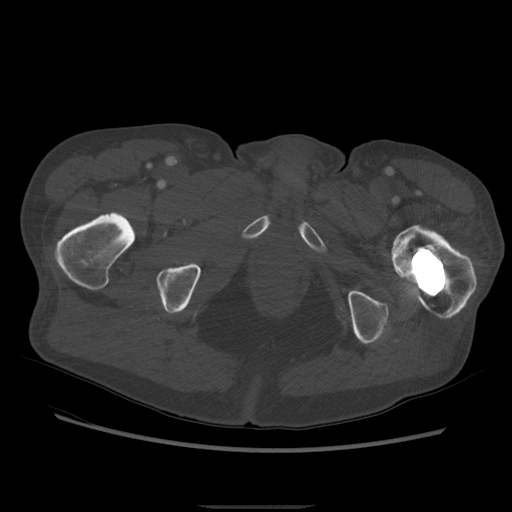
[im 40/200  soft-tissue]
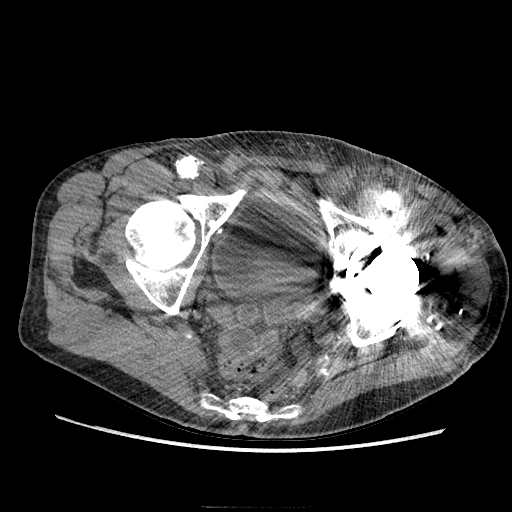
[im 60/200  soft-tissue]
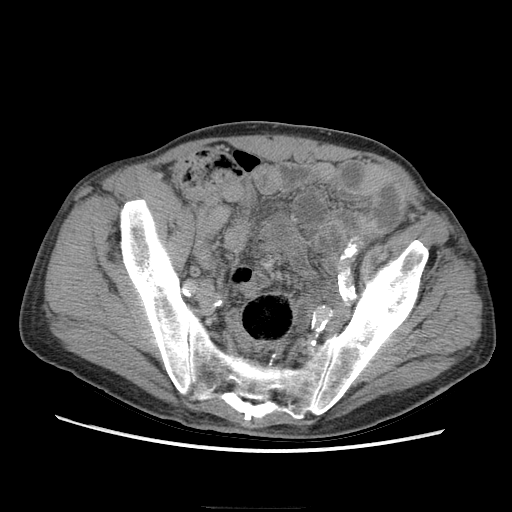
[im 80/200  soft-tissue]
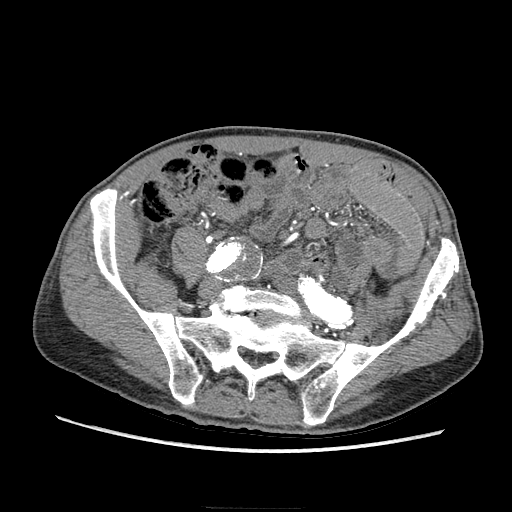
[im 100/200  soft-tissue]
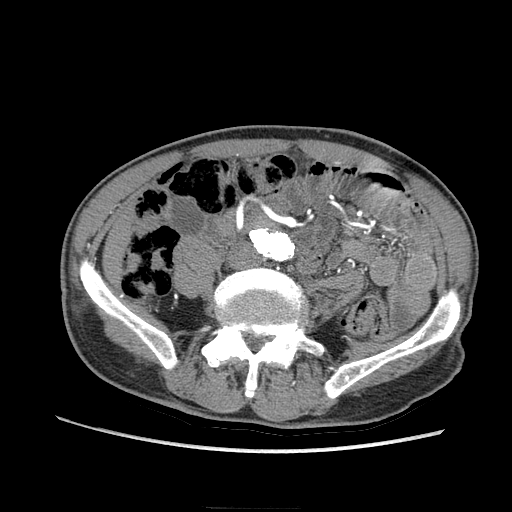
[im 120/200  soft-tissue]
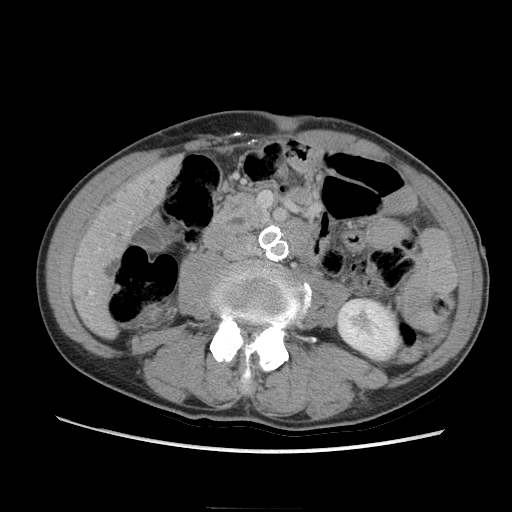
[im 120/200  lung]
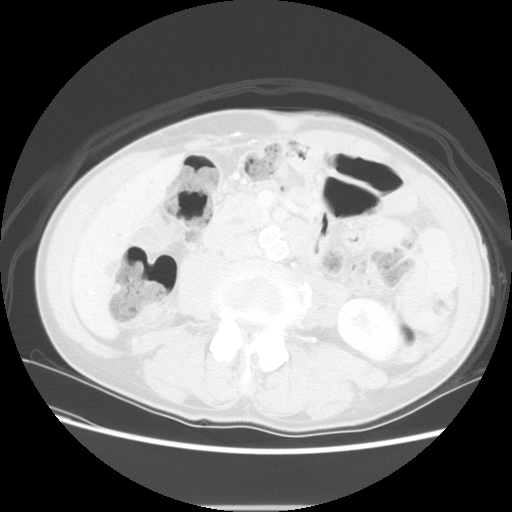
[im 140/200  soft-tissue]
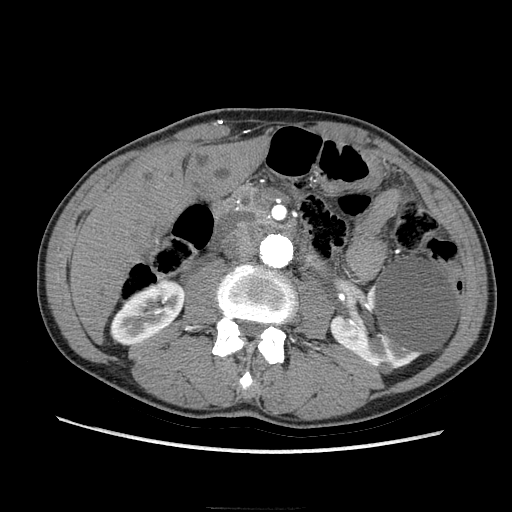
[im 140/200  lung]
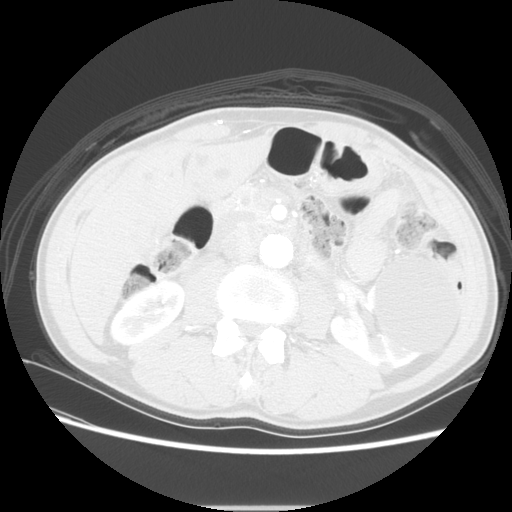
[im 160/200  soft-tissue]
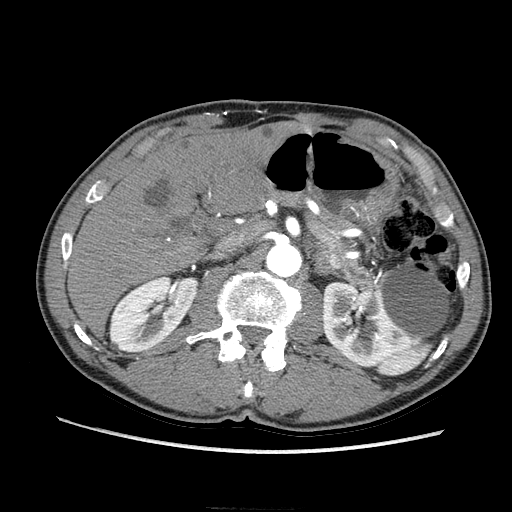
[im 160/200  lung]
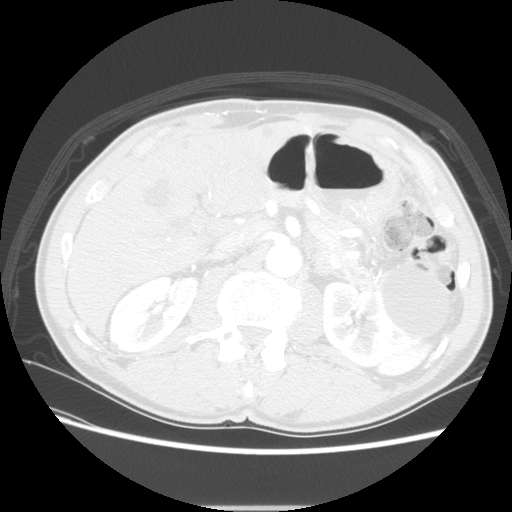
[im 180/200  soft-tissue]
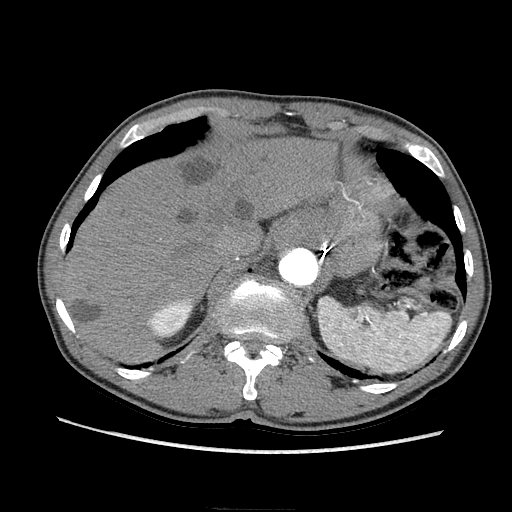
[im 180/200  lung]
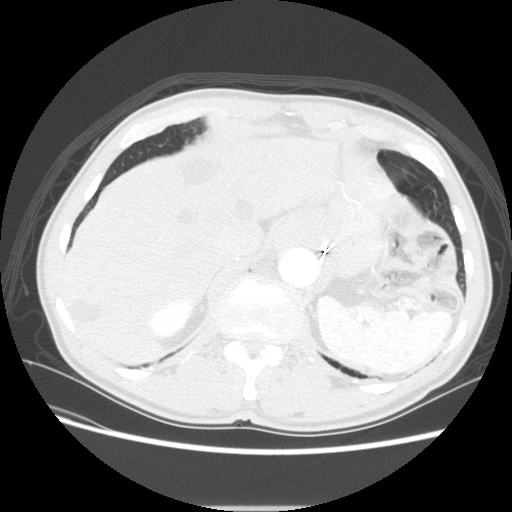
[im 180/200  bone]
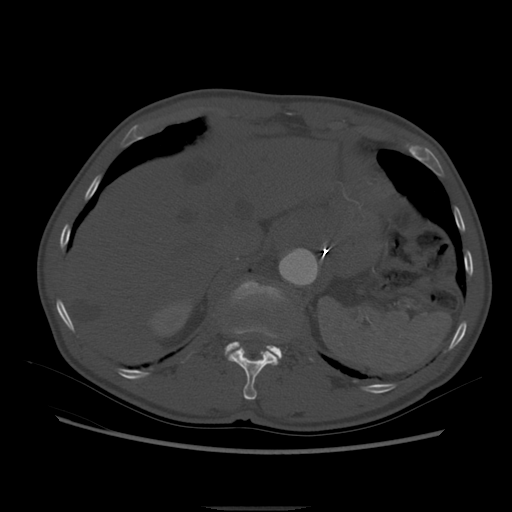

[Series 602: sagittal body · sagittal · 0.78mm/px · 2 of 141 slices shown]
[im 21/141  soft-tissue]
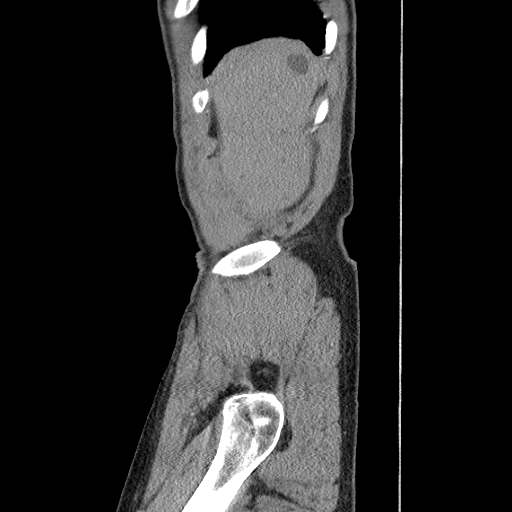
[im 41/141  soft-tissue]
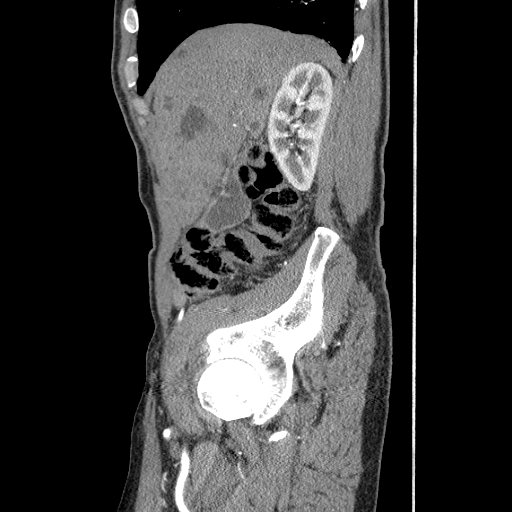

[11 of 46 positions shown; findings below may reference images not displayed]

FINDINGS: Aortobi-iliac stent graft is stable in position.  The
proximal landing zone is just inferior to the main left renal
artery.  There is no evidence of aortic aneurysm sac.  There is no
evidence of aneurysmal dilatation above the landing zone.

Celiac and SMA are patent.  Bilateral renal arteries are patent.
Branch vessels of the celiac artery are patent.  Branch vessels of
the SMA are grossly patent.

Proximal IMA is occluded.  Branch vessels reconstitute.

Right iliac limb landing zone occurs in the mid right external
iliac artery.  Coil embolization occlusion of the proximal right
internal iliac artery is stable.  Right common iliac artery
aneurysm sac is 3.4 cm in diameter.  Based on my direct
measurements of the prior study, this has not changed.  There is no
evidence of Endo leak.  The right limb of the graft is patent.

Left iliac limb is also patent with its landing zone in the distal
left common iliac artery.  Left common iliac artery  diameters
cm and not significantly changed.

Ectasia of the right common femoral artery measures up to 15 mm.
Proximal right superficial femoral and profunda femoral arteries
are patent.  Left common femoral artery ectasia measures 12 mm.
This is also stable.

Abnormal interpolar region hypodensity described on the previous
study is 12 mm in diameter is slightly larger.  Hounsfield
measurements precontrast, arterial phase, venous phase, and delayed
phase are 27, 26, 32, and 31 respectively. This is compatible with
complicated cyst.  Several hypodensities throughout the kidneys are
stable.  Several hypodensities scattered throughout the liver are
also not significantly changed.  There are too numerous to count.

Small bowel distention and bowel obstruction pattern have resolved.
Previously described fluid collections deep in the mesentery are no
longer evident.

Chronic fibrotic and bullous changes at the lung bases posteriorly
are again noted.  Increased bullae at the bilateral bases have
developed.

Post cholecystectomy.  Pancreatic and common bile duct remains
dilated.

Spleen is unremarkable.

Prostate is prominent measuring 5.4 cm in diameter and extending
into the base of the bladder.

Diffuse mild bladder wall thickening is nonspecific.

Stable mediastinum.

Left total hip arthroplasty noted.  There are lytic area
surrounding the femoral component worrisome for particle disease.

Prominent facet arthropathy in the lumbar spine.  Spinal stenosis
in the lumbar spine.

 Review of the MIP images confirms the above findings.
IMPRESSION: Aortobi-iliac stent graft is stable without complication.  No
evidence of Endo leak.

Resolved small bowel obstruction pattern and resolve fluid
collection within the lower abdomen.

Previously described left renal abnormality is compatible with a
minimally complex cyst.

## 2013-04-30 DIAGNOSIS — R209 Unspecified disturbances of skin sensation: Secondary | ICD-10-CM | POA: Diagnosis not present

## 2013-04-30 DIAGNOSIS — M25559 Pain in unspecified hip: Secondary | ICD-10-CM | POA: Diagnosis not present

## 2013-04-30 DIAGNOSIS — M5137 Other intervertebral disc degeneration, lumbosacral region: Secondary | ICD-10-CM | POA: Diagnosis not present

## 2013-05-01 DIAGNOSIS — E119 Type 2 diabetes mellitus without complications: Secondary | ICD-10-CM | POA: Diagnosis not present

## 2013-05-01 DIAGNOSIS — I10 Essential (primary) hypertension: Secondary | ICD-10-CM | POA: Diagnosis not present

## 2013-05-01 DIAGNOSIS — M5430 Sciatica, unspecified side: Secondary | ICD-10-CM | POA: Diagnosis not present

## 2013-05-22 DIAGNOSIS — M5137 Other intervertebral disc degeneration, lumbosacral region: Secondary | ICD-10-CM | POA: Diagnosis not present

## 2013-06-19 DIAGNOSIS — R209 Unspecified disturbances of skin sensation: Secondary | ICD-10-CM | POA: Diagnosis not present

## 2013-06-19 DIAGNOSIS — M5137 Other intervertebral disc degeneration, lumbosacral region: Secondary | ICD-10-CM | POA: Diagnosis not present

## 2013-07-16 ENCOUNTER — Telehealth: Payer: Self-pay

## 2013-07-16 NOTE — Telephone Encounter (Signed)
This CM received a voicemail message from April King patient's mother regarding his appointment on 07/17/13 @ 3:30pm, the call was to confirm appointment.

## 2013-07-23 DIAGNOSIS — Z23 Encounter for immunization: Secondary | ICD-10-CM | POA: Diagnosis not present

## 2013-08-07 ENCOUNTER — Encounter: Payer: Self-pay | Admitting: *Deleted

## 2013-08-07 ENCOUNTER — Encounter: Payer: Self-pay | Admitting: Internal Medicine

## 2013-08-08 ENCOUNTER — Encounter: Payer: Self-pay | Admitting: Internal Medicine

## 2013-08-08 ENCOUNTER — Ambulatory Visit (INDEPENDENT_AMBULATORY_CARE_PROVIDER_SITE_OTHER): Payer: Medicare Other | Admitting: Internal Medicine

## 2013-08-08 VITALS — BP 100/78 | HR 52 | Ht 71.0 in | Wt 144.9 lb

## 2013-08-08 DIAGNOSIS — E785 Hyperlipidemia, unspecified: Secondary | ICD-10-CM

## 2013-08-08 DIAGNOSIS — I2510 Atherosclerotic heart disease of native coronary artery without angina pectoris: Secondary | ICD-10-CM | POA: Diagnosis not present

## 2013-08-08 DIAGNOSIS — I10 Essential (primary) hypertension: Secondary | ICD-10-CM

## 2013-08-08 DIAGNOSIS — I714 Abdominal aortic aneurysm, without rupture: Secondary | ICD-10-CM | POA: Diagnosis not present

## 2013-08-08 NOTE — Progress Notes (Signed)
OFFICE NOTE  Chief Complaint:  Routine follow-up  Primary Care Physician: Lillia Mountain, MD  HPI:  Robert [name] is a 79 year-old gentleman with a history of remote inferior MI in the 51s. Catheterization in 1987 showed an occluded marginal branch of the RCA. He was treated medically at that time, but in 2004, he had worsening coronary disease and intervention in the circumflex with a Taxus stent. Since them, he has shown nonreversible defects on his Myoviews with a perfusion defect in the inferoseptal walls consistent with a prior scar. EF is 51%. He was recently in the hospital with a small-bowel obstruction and a CT angiogram was performed, which showed no pulmonary emboli. He also has a history of aortic stent graft, which was placed in 2008. Recently, he had felt funny, but could not describe it much better. He was found to be hypertensive and Wilburt Finlay increased his lisinopril to 30 mg daily and did not recommend any further treatment. Today he returns with persistently elevated blood pressure, 184/104. The blood pressure on a recheck was 150/80. Weight has been stable at around 162 to 163 pounds. At his last office visit I increased his lisinopril to 40 mg daily and his blood pressure now is much better controlled. He reports being asymptomatic and denies any chest pain worsening shortness of breath. His weight has been stable if not down a few pounds.  PMHx:  Past Medical History  Diagnosis Date  . Hypertension   . Hyperlipidemia   . Myocardial infarction 1980s  . CAD (coronary artery disease)     DES to Cfx  . Ulcer   . Small bowel obstruction 10/2011  . PAD (peripheral artery disease)     aortic stent graft 2008  . PUD (peptic ulcer disease)   . History of nuclear stress test 02/2012    lexiscan; mild-mod perfusion defect in basal inferoseptal, mid inferoseptal apical anterior; no change from previous study, abnormal test     Past Surgical History  Procedure  Laterality Date  . Eye surgery Right     cataract surgery  . Joint replacement Left     hip replacement  . Gastrectomy  50 yrs ago - 2 operations    had surgery at the age of 57 for the ulcer  . Cardiac catheterization  1987    occluded marginal branch of RCA, treated medically - Dr Elsie Lincoln  . Iliac artery aneurysm repair      Repaied by Dr. Hart Rochester april 2009  . Coronary angioplasty with stent placement  03/2003    2.5x22mm Taxus DES to Cfx  . Transthoracic echocardiogram  11/2009    EF=>55%, mild conc LVH, normal LV systolic function; mild MR; mild-mod TR with RV systolic pressure elevated at 54-09WJXB; mild-mod AV regurg; mild pulm valve regurg    FAMHx:  Family History  Problem Relation Age of Onset  . Stroke Father   . Heart disease Father   . Heart disease Daughter     SOCHx:   reports that he quit smoking about 21 years ago. His smoking use included Cigarettes. He smoked 0.00 packs per day for .5 years. He has never used smokeless tobacco. He reports that he does not drink alcohol or use illicit drugs.  ALLERGIES:  Allergies  Allergen Reactions  . Asa W/Codeine [Aspirin-Codeine] Other (See Comments)    Irritates his water system    ROS: A comprehensive review of systems was negative except for: Ears, nose, mouth, throat, and face: positive  for hearing loss Respiratory: positive for dyspnea on exertion  HOME MEDS: Current Outpatient Prescriptions  Medication Sig Dispense Refill  . alendronate (FOSAMAX) 70 MG tablet Take 70 mg by mouth every 7 (seven) days. Take with a full glass of water on an empty stomach. Friday      . brimonidine-timolol (COMBIGAN) 0.2-0.5 % ophthalmic solution Place 1 drop into both eyes daily.      . clopidogrel (PLAVIX) 75 MG tablet Take 75 mg by mouth daily.        . finasteride (PROSCAR) 5 MG tablet Take 5 mg by mouth daily.        . furosemide (LASIX) 20 MG tablet Take 20 mg by mouth daily.       Marland Kitchen latanoprost (XALATAN) 0.005 % ophthalmic  solution Place 1 drop into the left eye daily.      Marland Kitchen lisinopril (PRINIVIL,ZESTRIL) 20 MG tablet Take 40 mg by mouth daily.       . simvastatin (ZOCOR) 20 MG tablet Take 20 mg by mouth at bedtime.         No current facility-administered medications for this visit.    LABS/IMAGING: No results found for this or any previous visit (from the past 48 hour(s)). No results found.  VITALS: BP 100/78  Pulse 52  Ht 5\' 11"  (1.803 m)  Wt 144 lb 14.4 oz (65.726 kg)  BMI 20.22 kg/m2  EXAM: General appearance: alert and no distress Neck: no carotid bruit and no JVD Lungs: clear to auscultation bilaterally Heart: regular rate and rhythm, S1, S2 normal, no murmur, click, rub or gallop Abdomen: soft, non-tender; bowel sounds normal; no masses,  no organomegaly Extremities: extremities normal, atraumatic, no cyanosis or edema Pulses: 2+ and symmetric Skin: Skin color, texture, turgor normal. No rashes or lesions Neurologic: Grossly normal Psych: Mood, affect normal  EKG: Sinus bradycardia at 52, inferior and latateral TWI's, less prominent compared to prior EKG  ASSESSMENT: 1. Coronary disease with a history of remote inferior MI 2. History PCI to circumflex and known occluded right coronary artery 3. Dyslipidemia - followed by Dr. Valentina Lucks 4. Hypertension - at goal 5. History of stent graft for AAA 6.   Presbycusia  PLAN: 1.   Mr. Higinbotham is doing well and has no signs and symptoms of heart failure or chest pain suggestive of coronary ischemia. He is well controlled as far as his hypertension is concerned on the increased dose of lisinopril. He has an appointment tomorrow with his primary care provider to recheck his lipid profile. His main issue as I can see is that he has hearing loss and should be fitted for hearing aids. Cost is been an issue here. He is followed by Dr. Hart Rochester for his abdominal aortic aneurysm. Otherwise she is doing very well. We'll plan to see him back in 6 months or  sooner as necessary. No changes to his medications today  Chrystie Nose, MD, [facility] Attending Cardiologist [facility]  HILTY,Kenneth C 08/08/2013, 10:52 AM

## 2013-08-08 NOTE — Patient Instructions (Addendum)
Follow up in 6 months 

## 2013-08-10 DIAGNOSIS — I739 Peripheral vascular disease, unspecified: Secondary | ICD-10-CM | POA: Diagnosis not present

## 2013-08-10 DIAGNOSIS — E1159 Type 2 diabetes mellitus with other circulatory complications: Secondary | ICD-10-CM | POA: Diagnosis not present

## 2013-08-10 DIAGNOSIS — R634 Abnormal weight loss: Secondary | ICD-10-CM | POA: Diagnosis not present

## 2013-08-10 DIAGNOSIS — M5430 Sciatica, unspecified side: Secondary | ICD-10-CM | POA: Diagnosis not present

## 2013-08-10 DIAGNOSIS — I10 Essential (primary) hypertension: Secondary | ICD-10-CM | POA: Diagnosis not present

## 2013-08-22 DIAGNOSIS — H409 Unspecified glaucoma: Secondary | ICD-10-CM | POA: Diagnosis not present

## 2013-08-22 DIAGNOSIS — H2510 Age-related nuclear cataract, unspecified eye: Secondary | ICD-10-CM | POA: Diagnosis not present

## 2013-08-22 DIAGNOSIS — H4011X0 Primary open-angle glaucoma, stage unspecified: Secondary | ICD-10-CM | POA: Diagnosis not present

## 2013-10-01 ENCOUNTER — Encounter: Payer: Self-pay | Admitting: Family

## 2013-10-02 ENCOUNTER — Encounter: Payer: Self-pay | Admitting: Family

## 2013-10-02 ENCOUNTER — Ambulatory Visit (INDEPENDENT_AMBULATORY_CARE_PROVIDER_SITE_OTHER): Payer: Medicare Other | Admitting: Family

## 2013-10-02 VITALS — BP 126/80 | HR 56 | Resp 14 | Ht 71.0 in | Wt 147.0 lb

## 2013-10-02 DIAGNOSIS — I723 Aneurysm of iliac artery: Secondary | ICD-10-CM

## 2013-10-02 DIAGNOSIS — Z48812 Encounter for surgical aftercare following surgery on the circulatory system: Secondary | ICD-10-CM

## 2013-10-02 DIAGNOSIS — M79609 Pain in unspecified limb: Secondary | ICD-10-CM | POA: Diagnosis not present

## 2013-10-02 NOTE — Progress Notes (Signed)
[facility] [place] HISTORY AND PHYSICAL -PAD   History of Present Illness Robert [name] is a 79 y.o. male patient of Dr. Hart Rochester who is status post stent graft repair for right iliac artery aneurysm in April of 2009. He did have small bowel obstruction in April, 2013 which did not require surgery.  On his 07/18/12 visit Dr. Hart Rochester reviewed his CT angiogram. The aortic biiliac stent graft was in excellent position. The right common iliac artery aneurysm had no change in dimension at 3.4 cm. Embolization was previously done to the right internal iliac artery which was stable. The graft terminates in the external iliac artery. Doing well post aortobiiliac stent graft for right common iliac artery aneurysm and Dr. Candie Chroman note indicates that patient should return in 2 years with CT angiogram to continue to follow. He returns today as he received a letter from our office reminding him of an appointment today; he is not having any problems. He states that he pulled a muscle in his left hip, being followed by ortho for this, states this is 95% better. He denies claudication symptoms, denies non-healing wounds. He denies any history of stroke or TIA. 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  Patient denies New Medical or Surgical History other than he cannot afford the $2500 cost of hearing aids.  Pt Diabetic: No Pt smoker: former smoker, quit 20 years ago  Pt meds include: Statin :Yes ASA: No Other anticoagulants/antiplatelets: Plavix  Past Medical History  Diagnosis Date  . Hypertension   . Hyperlipidemia   . Myocardial infarction 1980s  . CAD (coronary artery disease)     DES to Cfx  . Ulcer   . Small bowel obstruction 10/2011  . PAD (peripheral artery disease)     aortic stent graft 2008  . PUD (peptic ulcer disease)   . History of nuclear stress test 02/2012    lexiscan; mild-mod perfusion defect in basal inferoseptal, mid inferoseptal  apical anterior; no change from previous study, abnormal test     Social History History  Substance Use Topics  . Smoking status: Former Smoker -- .5 years    Types: Cigarettes    Quit date: 07/18/1992  . Smokeless tobacco: Never Used     Comment: quit around HA time  . Alcohol Use: No    Family History Family History  Problem Relation Age of Onset  . Stroke Father   . Heart disease Father   . Heart disease Daughter     Past Surgical History  Procedure Laterality Date  . Eye surgery Right     cataract surgery  . Joint replacement Left     hip replacement  . Gastrectomy  50 yrs ago - 2 operations    had surgery at the age of 59 for the ulcer  . Cardiac catheterization  1987    occluded marginal branch of RCA, treated medically - Dr Elsie Lincoln  . Iliac artery aneurysm repair      Repaied by Dr. Hart Rochester april 2009  . Coronary angioplasty with stent placement  03/2003    2.5x71mm Taxus DES to Cfx  . Transthoracic echocardiogram  11/2009    EF=>55%, mild conc LVH, normal LV systolic function; mild MR; mild-mod TR with RV systolic pressure elevated at 16-10RUEA; mild-mod AV regurg; mild pulm valve regurg    Allergies  Allergen Reactions  . Jonne Ply W/Codeine [Aspirin-Codeine] Other (See Comments)    Irritates his water system    Current  Outpatient Prescriptions  Medication Sig Dispense Refill  . alendronate (FOSAMAX) 70 MG tablet Take 70 mg by mouth every 7 (seven) days. Take with a full glass of water on an empty stomach. Friday      . brimonidine-timolol (COMBIGAN) 0.2-0.5 % ophthalmic solution Place 1 drop into both eyes daily.      . clopidogrel (PLAVIX) 75 MG tablet Take 75 mg by mouth daily.        . finasteride (PROSCAR) 5 MG tablet Take 5 mg by mouth daily.        . furosemide (LASIX) 20 MG tablet Take 20 mg by mouth daily.       Marland Kitchen latanoprost (XALATAN) 0.005 % ophthalmic solution Place 1 drop into the left eye daily.      Marland Kitchen lisinopril (PRINIVIL,ZESTRIL) 20 MG tablet Take  40 mg by mouth daily.       . simvastatin (ZOCOR) 20 MG tablet Take 20 mg by mouth at bedtime.         No current facility-administered medications for this visit.    Physical Examination  Filed Vitals:   10/02/13 0945  BP: 126/80  Pulse: 56  Resp: 14   Filed Weights   10/02/13 0945  Weight: 147 lb (66.679 kg)   Body mass index is 20.51 kg/(m^2).  General: A&O x 3, WDWN,  Gait: normal Eyes: PERRLA, Pulmonary: CTAB, without wheezes , rales or rhonchi Cardiac: regular Rythm , with murmur          Carotid Bruits Left Right   Negative Negative  Aorta: is palpable Radial pulses: 1+ right, 2+ left                           VASCULAR EXAM: Extremities without ischemic changes  without Gangrene; without open wounds.                                                                                                          LE Pulses LEFT RIGHT       FEMORAL   palpable   palpable        POPLITEAL  not palpable   not palpable       POSTERIOR TIBIAL  not palpable    palpable        DORSALIS PEDIS      ANTERIOR TIBIAL not palpable  faintly palpable    Abdomen: soft, NT, no masses. Skin: no rashes, no ulcers noted. Musculoskeletal: no muscle wasting or atrophy.  Neurologic: A&O X 3; Appropriate Affect ; SENSATION: normal; MOTOR FUNCTION:  moving all extremities equally, motor strength 5/5 throughout. Speech is fluent/normal. CN 2-12 intact except for very hard of hearing.   ASSESSMENT: Robert [name] is a 79 y.o. male who  who is status post stent graft repair for right iliac artery aneurysm in April of 2009. He has no claudication symptoms, no ischemic changes in his lower extremities, but his left pedal pulses are not palpable. Will add ABI's when he returns in 8 months after having  his follow up CT angiogram of abdomen/pelvis.  PLAN:  I discussed in depth with the patient the nature of atherosclerosis, and emphasized the importance of maximal medical management  including strict control of blood pressure, blood glucose, and lipid levels, obtaining regular exercise, and continued cessation of smoking.  The patient is aware that without maximal medical management the underlying atherosclerotic disease process will progress, limiting the benefit of any interventions.  Based on the patient's vascular studies and examination, pt will return to clinic in 8  months after CT angiogram of abdomen/pelvis is done and ABI's (ABI's to be done in office).  The patient was given information about PAD including signs, symptoms, treatment, what symptoms should prompt the patient to seek immediate medical care, and risk reduction measures to take.  Charisse March, RN, MSN, FNP-C [facility] Phone: [phone]  Clinic MD: Hart Rochester  10/02/2013 9:35 AM

## 2013-10-02 NOTE — Patient Instructions (Signed)
Peripheral Vascular Disease Peripheral Vascular Disease (PVD), also called Peripheral Arterial Disease (PAD), is a circulation problem caused by cholesterol (atherosclerotic plaque) deposits in the arteries. PVD commonly occurs in the lower extremities (legs) but it can occur in other areas of the body, such as your arms. The cholesterol buildup in the arteries reduces blood flow which can cause pain and other serious problems. The presence of PVD can place a person at risk for Coronary Artery Disease (CAD).  CAUSES  Causes of PVD can be many. It is usually associated with more than one risk factor such as:   High Cholesterol.  Smoking.  Diabetes.  Lack of exercise or inactivity.  High blood pressure (hypertension).  Obesity.  Family history. SYMPTOMS   When the lower extremities are affected, patients with PVD may experience:  Leg pain with exertion or physical activity. This is called INTERMITTENT CLAUDICATION. This may present as cramping or numbness with physical activity. The location of the pain is associated with the level of blockage. For example, blockage at the abdominal level (distal abdominal aorta) may result in buttock or hip pain. Lower leg arterial blockage may result in calf pain.  As PVD becomes more severe, pain can develop with less physical activity.  In people with severe PVD, leg pain may occur at rest.  Other PVD signs and symptoms:  Leg numbness or weakness.  Coldness in the affected leg or foot, especially when compared to the other leg.  A change in leg color.  Patients with significant PVD are more prone to ulcers or sores on toes, feet or legs. These may take longer to heal or may reoccur. The ulcers or sores can become infected.  If signs and symptoms of PVD are ignored, gangrene may occur. This can result in the loss of toes or loss of an entire limb.  Not all leg pain is related to PVD. Other medical conditions can cause leg pain such  as:  Blood clots (embolism) or Deep Vein Thrombosis.  Inflammation of the blood vessels (vasculitis).  Spinal stenosis. DIAGNOSIS  Diagnosis of PVD can involve several different types of tests. These can include:  Pulse Volume Recording Method (PVR). This test is simple, painless and does not involve the use of X-rays. PVR involves measuring and comparing the blood pressure in the arms and legs. An ABI (Ankle-Brachial Index) is calculated. The normal ratio of blood pressures is 1. As this number becomes smaller, it indicates more severe disease.  < 0.95  indicates significant narrowing in one or more leg vessels.  <0.8 there will usually be pain in the foot, leg or buttock with exercise.  <0.4 will usually have pain in the legs at rest.  <0.25  usually indicates limb threatening PVD.  Doppler detection of pulses in the legs. This test is painless and checks to see if you have a pulses in your legs/feet.  A dye or contrast material (a substance that highlights the blood vessels so they show up on x-ray) may be given to help your caregiver better see the arteries for the following tests. The dye is eliminated from your body by the kidney's. Your caregiver may order blood work to check your kidney function and other laboratory values before the following tests are performed:  Magnetic Resonance Angiography (MRA). An MRA is a picture study of the blood vessels and arteries. The MRA machine uses a large magnet to produce images of the blood vessels.  Computed Tomography Angiography (CTA). A CTA is a   specialized x-ray that looks at how the blood flows in your blood vessels. An IV may be inserted into your arm so contrast dye can be injected.  Angiogram. Is a procedure that uses x-rays to look at your blood vessels. This procedure is minimally invasive, meaning a small incision (cut) is made in your groin. A small tube (catheter) is then inserted into the artery of your groin. The catheter is  guided to the blood vessel or artery your caregiver wants to examine. Contrast dye is injected into the catheter. X-rays are then taken of the blood vessel or artery. After the images are obtained, the catheter is taken out. TREATMENT  Treatment of PVD involves many interventions which may include:  Lifestyle changes:  Quitting smoking.  Exercise.  Following a low fat, low cholesterol diet.  Control of diabetes.  Foot care is very important to the PVD patient. Good foot care can help prevent infection.  Medication:  Cholesterol-lowering medicine.  Blood pressure medicine.  Anti-platelet drugs.  Certain medicines may reduce symptoms of Intermittent Claudication.  Interventional/Surgical options:  Angioplasty. An Angioplasty is a procedure that inflates a balloon in the blocked artery. This opens the blocked artery to improve blood flow.  Stent Implant. A wire mesh tube (stent) is placed in the artery. The stent expands and stays in place, allowing the artery to remain open.  Peripheral Bypass Surgery. This is a surgical procedure that reroutes the blood around a blocked artery to help improve blood flow. This type of procedure may be performed if Angioplasty or stent implants are not an option. SEEK IMMEDIATE MEDICAL CARE IF:   You develop pain or numbness in your arms or legs.  Your arm or leg turns cold, becomes blue in color.  You develop redness, warmth, swelling and pain in your arms or legs. MAKE SURE YOU:   Understand these instructions.  Will watch your condition.  Will get help right away if you are not doing well or get worse. Document Released: 11/18/2004 Document Revised: 01/03/2012 Document Reviewed: 10/15/2008 [employer] Patient Information 2014 [employer].  

## 2013-12-14 DIAGNOSIS — R634 Abnormal weight loss: Secondary | ICD-10-CM | POA: Diagnosis not present

## 2013-12-14 DIAGNOSIS — E119 Type 2 diabetes mellitus without complications: Secondary | ICD-10-CM | POA: Diagnosis not present

## 2013-12-14 DIAGNOSIS — I10 Essential (primary) hypertension: Secondary | ICD-10-CM | POA: Diagnosis not present

## 2013-12-14 DIAGNOSIS — R000 Tachycardia, unspecified: Secondary | ICD-10-CM | POA: Diagnosis not present

## 2013-12-14 DIAGNOSIS — R1310 Dysphagia, unspecified: Secondary | ICD-10-CM | POA: Diagnosis not present

## 2013-12-19 DIAGNOSIS — H409 Unspecified glaucoma: Secondary | ICD-10-CM | POA: Diagnosis not present

## 2013-12-19 DIAGNOSIS — H4011X0 Primary open-angle glaucoma, stage unspecified: Secondary | ICD-10-CM | POA: Diagnosis not present

## 2013-12-27 ENCOUNTER — Other Ambulatory Visit: Payer: Self-pay | Admitting: Internal Medicine

## 2013-12-27 DIAGNOSIS — R1310 Dysphagia, unspecified: Secondary | ICD-10-CM

## 2014-01-01 ENCOUNTER — Ambulatory Visit
Admission: RE | Admit: 2014-01-01 | Discharge: 2014-01-01 | Disposition: A | Discharge status: Home / Self Care | Payer: Medicare Other | Source: Ambulatory Visit | Attending: Internal Medicine | Admitting: Internal Medicine

## 2014-01-01 DIAGNOSIS — R1310 Dysphagia, unspecified: Secondary | ICD-10-CM | POA: Diagnosis not present

## 2014-01-08 ENCOUNTER — Emergency Department (HOSPITAL_COMMUNITY)
Admission: EM | Admit: 2014-01-08 | Discharge: 2014-01-09 | Disposition: A | Discharge status: Home / Self Care | Payer: Medicare Other | Attending: Emergency Medicine | Admitting: Emergency Medicine

## 2014-01-08 ENCOUNTER — Emergency Department (HOSPITAL_COMMUNITY): Payer: Medicare Other

## 2014-01-08 ENCOUNTER — Encounter (HOSPITAL_COMMUNITY): Payer: Self-pay | Admitting: Emergency Medicine

## 2014-01-08 DIAGNOSIS — S022XXB Fracture of nasal bones, initial encounter for open fracture: Secondary | ICD-10-CM | POA: Diagnosis not present

## 2014-01-08 DIAGNOSIS — Z9889 Other specified postprocedural states: Secondary | ICD-10-CM | POA: Insufficient documentation

## 2014-01-08 DIAGNOSIS — I252 Old myocardial infarction: Secondary | ICD-10-CM | POA: Diagnosis not present

## 2014-01-08 DIAGNOSIS — S0990XA Unspecified injury of head, initial encounter: Secondary | ICD-10-CM | POA: Insufficient documentation

## 2014-01-08 DIAGNOSIS — R040 Epistaxis: Secondary | ICD-10-CM | POA: Diagnosis not present

## 2014-01-08 DIAGNOSIS — I10 Essential (primary) hypertension: Secondary | ICD-10-CM | POA: Insufficient documentation

## 2014-01-08 DIAGNOSIS — Y929 Unspecified place or not applicable: Secondary | ICD-10-CM | POA: Insufficient documentation

## 2014-01-08 DIAGNOSIS — E785 Hyperlipidemia, unspecified: Secondary | ICD-10-CM | POA: Diagnosis not present

## 2014-01-08 DIAGNOSIS — S0120XA Unspecified open wound of nose, initial encounter: Secondary | ICD-10-CM | POA: Diagnosis not present

## 2014-01-08 DIAGNOSIS — I2510 Atherosclerotic heart disease of native coronary artery without angina pectoris: Secondary | ICD-10-CM | POA: Insufficient documentation

## 2014-01-08 DIAGNOSIS — Z8719 Personal history of other diseases of the digestive system: Secondary | ICD-10-CM | POA: Insufficient documentation

## 2014-01-08 DIAGNOSIS — S0993XA Unspecified injury of face, initial encounter: Secondary | ICD-10-CM | POA: Diagnosis not present

## 2014-01-08 DIAGNOSIS — Z872 Personal history of diseases of the skin and subcutaneous tissue: Secondary | ICD-10-CM | POA: Insufficient documentation

## 2014-01-08 DIAGNOSIS — Z87891 Personal history of nicotine dependence: Secondary | ICD-10-CM | POA: Diagnosis not present

## 2014-01-08 DIAGNOSIS — Z8711 Personal history of peptic ulcer disease: Secondary | ICD-10-CM | POA: Diagnosis not present

## 2014-01-08 DIAGNOSIS — Z79899 Other long term (current) drug therapy: Secondary | ICD-10-CM | POA: Diagnosis not present

## 2014-01-08 DIAGNOSIS — IMO0002 Reserved for concepts with insufficient information to code with codable children: Secondary | ICD-10-CM | POA: Insufficient documentation

## 2014-01-08 DIAGNOSIS — Z7902 Long term (current) use of antithrombotics/antiplatelets: Secondary | ICD-10-CM | POA: Insufficient documentation

## 2014-01-08 DIAGNOSIS — S022XXA Fracture of nasal bones, initial encounter for closed fracture: Secondary | ICD-10-CM | POA: Diagnosis not present

## 2014-01-08 DIAGNOSIS — S199XXA Unspecified injury of neck, initial encounter: Secondary | ICD-10-CM | POA: Diagnosis not present

## 2014-01-08 DIAGNOSIS — W19XXXA Unspecified fall, initial encounter: Secondary | ICD-10-CM

## 2014-01-08 DIAGNOSIS — Y9389 Activity, other specified: Secondary | ICD-10-CM | POA: Insufficient documentation

## 2014-01-08 DIAGNOSIS — W010XXA Fall on same level from slipping, tripping and stumbling without subsequent striking against object, initial encounter: Secondary | ICD-10-CM | POA: Insufficient documentation

## 2014-01-08 NOTE — ED Notes (Signed)
Pt was hooking a chain to his truck and got tangled up and made him fall into the concrete face forward.  Pt is on plavix.  No LOC and no neck pain.  Pt has swelling and abrasion to mid nose.

## 2014-01-08 NOTE — ED Provider Notes (Signed)
LACERATION REPAIR Performed by: Linus Mako Authorized by: Linus Mako Consent: Verbal consent obtained. Risks and benefits: risks, benefits and alternatives were discussed Consent given by: patient Patient identity confirmed: provided demographic data Prepped and Draped in normal sterile fashion Wound explored  Laceration Location: nasal bridge  Laceration Length: 1.5 cm  No Foreign Bodies seen or palpated  Anesthesia: local infiltration  Local anesthetic: lidocaine 1% wo epinephrine  Anesthetic total: 2 ml  Irrigation method: syringe Amount of cleaning: standard  Skin closure: sutures  Number of sutures: 2  Technique: simple interrupted.  Patient tolerance: Patient tolerated the procedure well with no immediate complications.   For HPI, ROS, PE, and DIPSO refer to Dr. Robin Searing note.  Linus Mako, PA-C 01/09/14 0000

## 2014-01-08 NOTE — ED Notes (Signed)
Gre[place], PA at bedside  

## 2014-01-08 NOTE — ED Notes (Addendum)
Nurse First Rounds : Nurse explained delay , process and wait time to pt. No distress/ respirations unlabored.

## 2014-01-08 NOTE — ED Provider Notes (Signed)
CSN: [id number]     Arrival date & time 01/08/14  1609 History   First MD Initiated Contact with Patient 01/08/14 2116     Chief Complaint  Patient presents with  . Facial Injury     (Consider location/radiation/quality/duration/timing/severity/associated sxs/prior Treatment) HPI Comments: Suffered mechanical fall around 3 PM when he tripped over a chain and fell face forward onto the concrete. Did not lose consciousness. He is on Plavix. He denies any neck pain, chest, back or abdominal pain. He has an abrasion and bleeding from his external nose as well as nares bilaterally. He is not Coumadin. Denies any focal weakness, numbness or tingling. Denies any syncope, dizziness or lightheadedness.  The history is provided by the patient.    Past Medical History  Diagnosis Date  . Hypertension   . Hyperlipidemia   . Myocardial infarction 1980s  . CAD (coronary artery disease)     DES to Cfx  . Ulcer   . Small bowel obstruction 10/2011  . PAD (peripheral artery disease)     aortic stent graft 2008  . PUD (peptic ulcer disease)   . History of nuclear stress test 02/2012    lexiscan; mild-mod perfusion defect in basal inferoseptal, mid inferoseptal apical anterior; no change from previous study, abnormal test    Past Surgical History  Procedure Laterality Date  . Eye surgery Right     cataract surgery  . Joint replacement Left     hip replacement  . Gastrectomy  50 yrs ago - 2 operations    had surgery at the age of 28 for the ulcer  . Cardiac catheterization  1987    occluded marginal branch of RCA, treated medically - Dr Melvern Banker  . Iliac artery aneurysm repair      Repaied by Dr. Kellie Simmering april 2009  . Coronary angioplasty with stent placement  03/2003    2.5x66mm Taxus DES to Cfx  . Transthoracic echocardiogram  11/2009    EF=>55%, mild conc LVH, normal LV systolic function; mild MR; mild-mod TR with RV systolic pressure elevated at 30-14mmHg; mild-mod AV regurg; mild pulm valve  regurg   Family History  Problem Relation Age of Onset  . Stroke Father   . Heart disease Father   . Heart disease Daughter    History  Substance Use Topics  . Smoking status: Former Smoker -- .5 years    Types: Cigarettes    Quit date: 07/18/1992  . Smokeless tobacco: Never Used     Comment: quit around HA time  . Alcohol Use: No    Review of Systems  Constitutional: Negative for fever, activity change and appetite change.  HENT: Positive for facial swelling and nosebleeds. Negative for congestion.   Respiratory: Negative for cough, chest tightness and shortness of breath.   Cardiovascular: Negative for chest pain.  Gastrointestinal: Negative for abdominal pain.  Genitourinary: Negative for dysuria and hematuria.  Musculoskeletal: Negative for arthralgias, back pain, myalgias and neck pain.  Neurological: Negative for dizziness, weakness and headaches.  A complete 10 system review of systems was obtained and all systems are negative except as noted in the HPI and PMH.      Allergies  Asa w/codeine  Home Medications   Current Outpatient Rx  Name  Route  Sig  Dispense  Refill  . alendronate (FOSAMAX) 70 MG tablet   Oral   Take 70 mg by mouth every 7 (seven) days. Take with a full glass of water on an empty stomach. Friday         .  brimonidine-timolol (COMBIGAN) 0.2-0.5 % ophthalmic solution   Both Eyes   Place 1 drop into both eyes daily.         . clopidogrel (PLAVIX) 75 MG tablet   Oral   Take 75 mg by mouth daily.           . finasteride (PROSCAR) 5 MG tablet   Oral   Take 5 mg by mouth daily.           . furosemide (LASIX) 20 MG tablet   Oral   Take 20 mg by mouth daily.          Marland Kitchen latanoprost (XALATAN) 0.005 % ophthalmic solution   Left Eye   Place 1 drop into the left eye daily.         Marland Kitchen lisinopril (PRINIVIL,ZESTRIL) 20 MG tablet   Oral   Take 40 mg by mouth daily.          . simvastatin (ZOCOR) 20 MG tablet   Oral   Take 20 mg  by mouth 2 (two) times daily.          . cephALEXin (KEFLEX) 500 MG capsule   Oral   Take 1 capsule (500 mg total) by mouth 4 (four) times daily.   40 capsule   0    BP 132/87  Pulse 55  Temp(Src) 98.7 F (37.1 C) (Oral)  Resp 17  Ht 5\' 11"  (1.803 m)  Wt 148 lb (67.132 kg)  BMI 20.65 kg/m2  SpO2 98% Physical Exam  Constitutional: He is oriented to person, place, and time. He appears well-developed and well-nourished. No distress.  HENT:  Head: Normocephalic and atraumatic.  Mouth/Throat: Oropharynx is clear and moist. No oropharyngeal exudate.  Abrasion and dried blood to external nose. After dried blood removed, 1.5 cm vertical laceration, Dried blood in bilateral nares. No active bleeding. No septal hematoma.  Eyes: Conjunctivae and EOM are normal. Pupils are equal, round, and reactive to light.  Neck: Normal range of motion. Neck supple.  No C spine tenderness  Cardiovascular: Normal rate, regular rhythm and normal heart sounds.   Pulmonary/Chest: Effort normal and breath sounds normal. No respiratory distress.  Abdominal: Soft. Bowel sounds are normal. There is no tenderness. There is no rebound and no guarding.  Musculoskeletal: Normal range of motion. He exhibits no edema and no tenderness.  Neurological: He is alert and oriented to person, place, and time. No cranial nerve deficit. He exhibits normal muscle tone.  CN 2-12 intact, no ataxia on finger to nose, no nystagmus, 5/5 strength throughout, no pronator drift, Romberg negative, normal gait.   Skin: Skin is warm.    ED Course  Procedures (including critical care time) Labs Review Labs Reviewed - No data to display Imaging Review Ct Head Wo Contrast  01/08/2014   CLINICAL DATA:  FACIAL INJURY; fall  EXAM: CT HEAD WITHOUT CONTRAST  CT MAXILLOFACIAL WITHOUT CONTRAST  CT CERVICAL SPINE WITHOUT CONTRAST  TECHNIQUE: Multidetector CT imaging of the head, cervical spine, and maxillofacial structures were performed  using the standard protocol without intravenous contrast. Multiplanar CT image reconstructions of the cervical spine and maxillofacial structures were also generated.  COMPARISON:  CT NECK W/CM dated 09/06/2011; CT HEAD W/O CM dated 09/09/2005  FINDINGS: CT HEAD FINDINGS  No acute intracranial abnormality. Specifically, no hemorrhage, hydrocephalus, mass lesion, acute infarction, or significant intracranial injury. No acute calvarial abnormality. There is a low-attenuation project within the subcortical, deep, and periventricular white matter regions. There is a ganglia  calcifications are identified. There is mild diffuse cortical atrophy. The visualized paranasal sinuses demonstrate mucosal thickening within the ethmoid air cells. Mastoid air cells are patent.  CT MAXILLOFACIAL FINDINGS  A mildly depressed, mildly comminuted fracture is appreciated distally within the nasal bone on the right. There is diffuse soft tissue swelling involving the nose. No further fracture or dislocation is appreciated. The orbits are unremarkable. The mandible and temporomandibular joints are unremarkable. There is bilateral stenosis of the ostia of the maxillary sinuses. The turbinates are otherwise unremarkable. There is diffuse mucosal thickening within the ethmoid air cells and areas of opacification.  CT CERVICAL SPINE FINDINGS  There is no evidence of fracture, nor dislocation. There is mild reversal of the cervical lordosis within the upper cervical spine. This is likely secondary to degenerative change. Disc space narrowing, endplate sclerosis, endplate cyst formation, and peripheral hypertrophic spurring is identified at the C3-4, C4-5, and to a lesser extent the C5-6 level. These findings also demonstrated at the C6-7 level more severe than the C5-6 level. There is no evidence of canal stenosis. There is no evidence of prevertebral soft tissue swelling.  IMPRESSION: 1. Involutional and chronic changes without evidence of  acute intracranial abnormalities. 2. Fracture involving the distal aspect of the nasal bone on the right. 3. Degenerative changes within the cervical spine without evidence of acute osseous abnormalities.   Electronically Signed   By: Margaree Mackintosh M.D.   On: 01/08/2014 17:12   Ct Cervical Spine Wo Contrast  01/08/2014   CLINICAL DATA:  FACIAL INJURY; fall  EXAM: CT HEAD WITHOUT CONTRAST  CT MAXILLOFACIAL WITHOUT CONTRAST  CT CERVICAL SPINE WITHOUT CONTRAST  TECHNIQUE: Multidetector CT imaging of the head, cervical spine, and maxillofacial structures were performed using the standard protocol without intravenous contrast. Multiplanar CT image reconstructions of the cervical spine and maxillofacial structures were also generated.  COMPARISON:  CT NECK W/CM dated 09/06/2011; CT HEAD W/O CM dated 09/09/2005  FINDINGS: CT HEAD FINDINGS  No acute intracranial abnormality. Specifically, no hemorrhage, hydrocephalus, mass lesion, acute infarction, or significant intracranial injury. No acute calvarial abnormality. There is a low-attenuation project within the subcortical, deep, and periventricular white matter regions. There is a ganglia calcifications are identified. There is mild diffuse cortical atrophy. The visualized paranasal sinuses demonstrate mucosal thickening within the ethmoid air cells. Mastoid air cells are patent.  CT MAXILLOFACIAL FINDINGS  A mildly depressed, mildly comminuted fracture is appreciated distally within the nasal bone on the right. There is diffuse soft tissue swelling involving the nose. No further fracture or dislocation is appreciated. The orbits are unremarkable. The mandible and temporomandibular joints are unremarkable. There is bilateral stenosis of the ostia of the maxillary sinuses. The turbinates are otherwise unremarkable. There is diffuse mucosal thickening within the ethmoid air cells and areas of opacification.  CT CERVICAL SPINE FINDINGS  There is no evidence of fracture,  nor dislocation. There is mild reversal of the cervical lordosis within the upper cervical spine. This is likely secondary to degenerative change. Disc space narrowing, endplate sclerosis, endplate cyst formation, and peripheral hypertrophic spurring is identified at the C3-4, C4-5, and to a lesser extent the C5-6 level. These findings also demonstrated at the C6-7 level more severe than the C5-6 level. There is no evidence of canal stenosis. There is no evidence of prevertebral soft tissue swelling.  IMPRESSION: 1. Involutional and chronic changes without evidence of acute intracranial abnormalities. 2. Fracture involving the distal aspect of the nasal bone on the right. 3.  Degenerative changes within the cervical spine without evidence of acute osseous abnormalities.   Electronically Signed   By: Margaree Mackintosh M.D.   On: 01/08/2014 17:12   Ct Maxillofacial Wo Cm  01/08/2014   CLINICAL DATA:  FACIAL INJURY; fall  EXAM: CT HEAD WITHOUT CONTRAST  CT MAXILLOFACIAL WITHOUT CONTRAST  CT CERVICAL SPINE WITHOUT CONTRAST  TECHNIQUE: Multidetector CT imaging of the head, cervical spine, and maxillofacial structures were performed using the standard protocol without intravenous contrast. Multiplanar CT image reconstructions of the cervical spine and maxillofacial structures were also generated.  COMPARISON:  CT NECK W/CM dated 09/06/2011; CT HEAD W/O CM dated 09/09/2005  FINDINGS: CT HEAD FINDINGS  No acute intracranial abnormality. Specifically, no hemorrhage, hydrocephalus, mass lesion, acute infarction, or significant intracranial injury. No acute calvarial abnormality. There is a low-attenuation project within the subcortical, deep, and periventricular white matter regions. There is a ganglia calcifications are identified. There is mild diffuse cortical atrophy. The visualized paranasal sinuses demonstrate mucosal thickening within the ethmoid air cells. Mastoid air cells are patent.  CT MAXILLOFACIAL FINDINGS  A  mildly depressed, mildly comminuted fracture is appreciated distally within the nasal bone on the right. There is diffuse soft tissue swelling involving the nose. No further fracture or dislocation is appreciated. The orbits are unremarkable. The mandible and temporomandibular joints are unremarkable. There is bilateral stenosis of the ostia of the maxillary sinuses. The turbinates are otherwise unremarkable. There is diffuse mucosal thickening within the ethmoid air cells and areas of opacification.  CT CERVICAL SPINE FINDINGS  There is no evidence of fracture, nor dislocation. There is mild reversal of the cervical lordosis within the upper cervical spine. This is likely secondary to degenerative change. Disc space narrowing, endplate sclerosis, endplate cyst formation, and peripheral hypertrophic spurring is identified at the C3-4, C4-5, and to a lesser extent the C5-6 level. These findings also demonstrated at the C6-7 level more severe than the C5-6 level. There is no evidence of canal stenosis. There is no evidence of prevertebral soft tissue swelling.  IMPRESSION: 1. Involutional and chronic changes without evidence of acute intracranial abnormalities. 2. Fracture involving the distal aspect of the nasal bone on the right. 3. Degenerative changes within the cervical spine without evidence of acute osseous abnormalities.   Electronically Signed   By: Margaree Mackintosh M.D.   On: 01/08/2014 17:12     EKG Interpretation None      MDM   Final diagnoses:  Open fracture of nasal bone  Fall  Head injury   Mechanical fall with facial trauma. No loss of consciousness. Patient takes aspirin and Plavix. Denies any focal weakness, numbness or tingling. No chest pain, back, neck or abdominal pain.  Tetanus is up-to-date. CT head negative. Nasal fracture evident on initial CT. C-spine negative.  After dried blood removed from nose, there is laceration to the bridge of nose that'll require suture. No septal  hematoma.  Case discussed with on-call facial trauma Dr. Iran Planas who agrees with the suturing and antibiotics and followup this week. Wound repaired by Carlota Raspberry PAC.  Tetanus up to date. Ambulatory and tolerating PO.  Head injury precautions given. Return with worsening headache, confusion, vomiting, or any other concerns.    Ezequiel Essex, MD 01/09/14 [phone]

## 2014-01-09 MED ORDER — CEPHALEXIN 500 MG PO CAPS: 500.0000 mg | ORAL_CAPSULE | Freq: Four times a day (QID) | ORAL | Status: DC

## 2014-01-09 NOTE — Discharge Instructions (Signed)
Head Injury, Adult Followup with your doctor for suture removal in 5-7 days. Return to the ED if you develop worsening headache, vomiting, confusion or any other concerns. You have received a head injury. It does not appear serious at this time. Headaches and vomiting are common following head injury. It should be easy to awaken from sleeping. Sometimes it is necessary for you to stay in the emergency department for a while for observation. Sometimes admission to the hospital may be needed. After injuries such as yours, most problems occur within the first 24 hours, but side effects may occur up to 7 10 days after the injury. It is important for you to carefully monitor your condition and contact your health care provider or seek immediate medical care if there is a change in your condition. WHAT ARE THE TYPES OF HEAD INJURIES? Head injuries can be as minor as a bump. Some head injuries can be more severe. More severe head injuries include:  A jarring injury to the brain (concussion).  A bruise of the brain (contusion). This mean there is bleeding in the brain that can cause swelling.  A cracked skull (skull fracture).  Bleeding in the brain that collects, clots, and forms a bump (hematoma). WHAT CAUSES A HEAD INJURY? A serious head injury is most likely to happen to someone who is in a car wreck and is not wearing a seat belt. Other causes of major head injuries include bicycle or motorcycle accidents, sports injuries, and falls. HOW ARE HEAD INJURIES DIAGNOSED? A complete history of the event leading to the injury and your current symptoms will be helpful in diagnosing head injuries. Many times, pictures of the brain, such as CT or MRI are needed to see the extent of the injury. Often, an overnight hospital stay is necessary for observation.  WHEN SHOULD I SEEK IMMEDIATE MEDICAL CARE?  You should get help right away if:  You have confusion or drowsiness.  You feel sick to your stomach  (nauseous) or have continued, forceful vomiting.  You have dizziness or unsteadiness that is getting worse.  You have severe, continued headaches not relieved by medicine. Only take over-the-counter or prescription medicines for pain, fever, or discomfort as directed by your health care provider.  You do not have normal function of the arms or legs or are unable to walk.  You notice changes in the black spots in the center of the colored part of your eye (pupil).  You have a clear or bloody fluid coming from your nose or ears.  You have a loss of vision. During the next 24 hours after the injury, you must stay with someone who can watch you for the warning signs. This person should contact local emergency services (911 in the [place] if you have seizures, you become unconscious, or you are unable to wake up. HOW CAN I PREVENT A HEAD INJURY IN THE FUTURE? The most important factor for preventing major head injuries is avoiding motor vehicle accidents. To minimize the potential for damage to your head, it is crucial to wear seat belts while riding in motor vehicles. Wearing helmets while bike riding and playing collision sports (like football) is also helpful. Also, avoiding dangerous activities around the house will further help reduce your risk of head injury.  WHEN CAN I RETURN TO NORMAL ACTIVITIES AND ATHLETICS? You should be reevaluated by your health care provider before returning to these activities. If you have any of the following symptoms, you should not return to  activities or contact sports until 1 week after the symptoms have stopped:  Persistent headache.  Dizziness or vertigo.  Poor attention and concentration.  Confusion.  Memory problems.  Nausea or vomiting.  Fatigue or tire easily.  Irritability.  Intolerant of bright lights or loud noises.  Anxiety or depression.  Disturbed sleep. MAKE SURE YOU:   Understand these instructions.  Will watch your  condition.  Will get help right away if you are not doing well or get worse. Document Released: 10/11/2005 Document Revised: 08/01/2013 Document Reviewed: 06/18/2013 [facility] Patient Information 2014 [place].

## 2014-01-09 NOTE — ED Provider Notes (Signed)
Medical screening examination/treatment/procedure(s) were conducted as a shared visit with non-physician practitioner(s) and myself.  I personally evaluated the patient during the encounter.   EKG Interpretation None       Ezequiel Essex, MD 01/09/14 [phone]

## 2014-01-14 DIAGNOSIS — Z4802 Encounter for removal of sutures: Secondary | ICD-10-CM | POA: Diagnosis not present

## 2014-01-28 ENCOUNTER — Other Ambulatory Visit: Payer: Self-pay | Admitting: Gastroenterology

## 2014-01-28 DIAGNOSIS — R1310 Dysphagia, unspecified: Secondary | ICD-10-CM | POA: Diagnosis not present

## 2014-02-14 ENCOUNTER — Encounter (HOSPITAL_COMMUNITY)
Admission: RE | Disposition: A | Discharge status: Home / Self Care | Payer: Self-pay | Source: Ambulatory Visit | Attending: Gastroenterology

## 2014-02-14 ENCOUNTER — Encounter (HOSPITAL_COMMUNITY): Payer: Self-pay | Admitting: *Deleted

## 2014-02-14 ENCOUNTER — Ambulatory Visit (HOSPITAL_COMMUNITY)
Admission: RE | Admit: 2014-02-14 | Discharge: 2014-02-14 | Disposition: A | Discharge status: Home / Self Care | Payer: Medicare Other | Source: Ambulatory Visit | Attending: Gastroenterology | Admitting: Gastroenterology

## 2014-02-14 DIAGNOSIS — M109 Gout, unspecified: Secondary | ICD-10-CM | POA: Diagnosis not present

## 2014-02-14 DIAGNOSIS — R1310 Dysphagia, unspecified: Secondary | ICD-10-CM | POA: Diagnosis not present

## 2014-02-14 DIAGNOSIS — E7800 Pure hypercholesterolemia, unspecified: Secondary | ICD-10-CM | POA: Diagnosis not present

## 2014-02-14 DIAGNOSIS — Z7902 Long term (current) use of antithrombotics/antiplatelets: Secondary | ICD-10-CM | POA: Insufficient documentation

## 2014-02-14 DIAGNOSIS — I2510 Atherosclerotic heart disease of native coronary artery without angina pectoris: Secondary | ICD-10-CM | POA: Insufficient documentation

## 2014-02-14 DIAGNOSIS — I10 Essential (primary) hypertension: Secondary | ICD-10-CM | POA: Diagnosis not present

## 2014-02-14 DIAGNOSIS — Z5309 Procedure and treatment not carried out because of other contraindication: Secondary | ICD-10-CM | POA: Insufficient documentation

## 2014-02-14 DIAGNOSIS — Z79899 Other long term (current) drug therapy: Secondary | ICD-10-CM | POA: Insufficient documentation

## 2014-02-14 DIAGNOSIS — Z96649 Presence of unspecified artificial hip joint: Secondary | ICD-10-CM | POA: Insufficient documentation

## 2014-02-14 DIAGNOSIS — E119 Type 2 diabetes mellitus without complications: Secondary | ICD-10-CM | POA: Diagnosis not present

## 2014-02-14 DIAGNOSIS — I252 Old myocardial infarction: Secondary | ICD-10-CM | POA: Insufficient documentation

## 2014-02-14 DIAGNOSIS — H409 Unspecified glaucoma: Secondary | ICD-10-CM | POA: Insufficient documentation

## 2014-02-14 SURGERY — ESOPHAGOGASTRODUODENOSCOPY (EGD) WITH ESOPHAGEAL DILATION
Anesthesia: Moderate Sedation

## 2014-02-14 MED ORDER — MIDAZOLAM HCL 10 MG/2ML IJ SOLN
INTRAMUSCULAR | Status: AC
  Filled 2014-02-14: qty 2

## 2014-02-14 MED ORDER — FENTANYL CITRATE 0.05 MG/ML IJ SOLN
INTRAMUSCULAR | Status: AC
  Filled 2014-02-14: qty 2

## 2014-02-14 MED ORDER — SODIUM CHLORIDE 0.9 % IV SOLN
INTRAVENOUS | Status: DC
  Administered 2014-02-14: 14:00:00 via INTRAVENOUS

## 2014-02-14 MED ORDER — DIPHENHYDRAMINE HCL 50 MG/ML IJ SOLN
INTRAMUSCULAR | Status: AC
  Filled 2014-02-14: qty 1

## 2014-02-14 NOTE — Op Note (Signed)
Problem: Esophageal dysphagia.  History: The patient was scheduled to undergo a diagnostic esophagogastroduodenoscopy with possible dilation of an esophageal stricture. He failed to stop taking Plavix one week prior to today's scheduled esophagogastroduodenoscopy.  Plan: Diagnostic esophagogastroduodenoscopy was canceled. The procedure will be rescheduled.

## 2014-02-14 NOTE — H&P (Signed)
  Problem: Esophageal dysphagia on Fosamax  History: The patient is a 80 year old male who intermittently experiences esophageal dysphagia localized to the retroxiphoid area unassociated with nausea, vomiting, odynophagia, or abdominal pain. He chronically takes Fosamax. On 01/01/2014 he underwent a normal barium esophagram.  The patient underwent an ulcer operation in the late 1960s to treat stomach ulcers.  The patient is scheduled to undergo a diagnostic esophagogastroduodenoscopy with possible esophageal dilation.  Past medical history: Hypertension. Coronary artery disease. Remote myocardial infarction. Mid circumflex artery stent placement in 2004. Chronic Plavix therapy. Hypercholesterolemia. Type 2 diabetes mellitus. Benign prostatic hypertrophy. Degenerative joint disease of the left hip. Peptic ulcer disease requiring gastric surgery. Gout. Glaucoma. Left hip replacement surgery.  Medication allergies: Codeine. Aspirin. Gabapentin.  Exam: The patient is alert and lying comfortably on the endoscopy stretcher. Lungs are clear to auscultation. Cardiac exam reveals a regular rhythm. Abdomen is soft and nontender to palpation  Plan: Proceed with diagnostic esophagogastroduodenoscopy with possible esophageal stricture dilation

## 2014-02-19 ENCOUNTER — Other Ambulatory Visit: Payer: Self-pay | Admitting: Gastroenterology

## 2014-03-03 ENCOUNTER — Other Ambulatory Visit: Payer: Self-pay | Admitting: Cardiology

## 2014-03-04 NOTE — Telephone Encounter (Signed)
Rx was sent to pharmacy electronically. 

## 2014-03-14 ENCOUNTER — Encounter (HOSPITAL_COMMUNITY): Payer: Self-pay | Admitting: *Deleted

## 2014-03-14 ENCOUNTER — Ambulatory Visit (HOSPITAL_COMMUNITY)
Admission: RE | Admit: 2014-03-14 | Discharge: 2014-03-14 | Disposition: A | Discharge status: Home / Self Care | Payer: Medicare Other | Source: Ambulatory Visit | Attending: Gastroenterology | Admitting: Gastroenterology

## 2014-03-14 ENCOUNTER — Encounter (HOSPITAL_COMMUNITY)
Admission: RE | Disposition: A | Discharge status: Home / Self Care | Payer: Self-pay | Source: Ambulatory Visit | Attending: Gastroenterology

## 2014-03-14 DIAGNOSIS — R1310 Dysphagia, unspecified: Secondary | ICD-10-CM | POA: Diagnosis not present

## 2014-03-14 DIAGNOSIS — K2940 Chronic atrophic gastritis without bleeding: Secondary | ICD-10-CM | POA: Insufficient documentation

## 2014-03-14 DIAGNOSIS — M1610 Unilateral primary osteoarthritis, unspecified hip: Secondary | ICD-10-CM | POA: Diagnosis not present

## 2014-03-14 DIAGNOSIS — K209 Esophagitis, unspecified without bleeding: Secondary | ICD-10-CM | POA: Diagnosis not present

## 2014-03-14 DIAGNOSIS — Z96649 Presence of unspecified artificial hip joint: Secondary | ICD-10-CM | POA: Diagnosis not present

## 2014-03-14 DIAGNOSIS — A048 Other specified bacterial intestinal infections: Secondary | ICD-10-CM | POA: Insufficient documentation

## 2014-03-14 DIAGNOSIS — Z980 Intestinal bypass and anastomosis status: Secondary | ICD-10-CM | POA: Diagnosis not present

## 2014-03-14 DIAGNOSIS — I252 Old myocardial infarction: Secondary | ICD-10-CM | POA: Diagnosis not present

## 2014-03-14 DIAGNOSIS — K2970 Gastritis, unspecified, without bleeding: Secondary | ICD-10-CM | POA: Diagnosis not present

## 2014-03-14 DIAGNOSIS — Z9861 Coronary angioplasty status: Secondary | ICD-10-CM | POA: Insufficient documentation

## 2014-03-14 DIAGNOSIS — I2510 Atherosclerotic heart disease of native coronary artery without angina pectoris: Secondary | ICD-10-CM | POA: Insufficient documentation

## 2014-03-14 DIAGNOSIS — M169 Osteoarthritis of hip, unspecified: Secondary | ICD-10-CM | POA: Insufficient documentation

## 2014-03-14 DIAGNOSIS — K2990 Gastroduodenitis, unspecified, without bleeding: Secondary | ICD-10-CM | POA: Diagnosis not present

## 2014-03-14 HISTORY — PX: ESOPHAGOGASTRODUODENOSCOPY: SHX5428

## 2014-03-14 SURGERY — EGD (ESOPHAGOGASTRODUODENOSCOPY)
Anesthesia: Moderate Sedation

## 2014-03-14 MED ORDER — FENTANYL CITRATE 0.05 MG/ML IJ SOLN
INTRAMUSCULAR | Status: DC | PRN
  Administered 2014-03-14: 50 ug via INTRAVENOUS

## 2014-03-14 MED ORDER — SODIUM CHLORIDE 0.9 % IV SOLN
INTRAVENOUS | Status: DC
  Administered 2014-03-14: 500 mL via INTRAVENOUS

## 2014-03-14 MED ORDER — MIDAZOLAM HCL 10 MG/2ML IJ SOLN
INTRAMUSCULAR | Status: DC | PRN
  Administered 2014-03-14 (×2): 2.5 mg via INTRAVENOUS

## 2014-03-14 MED ORDER — DIPHENHYDRAMINE HCL 50 MG/ML IJ SOLN
INTRAMUSCULAR | Status: AC
  Filled 2014-03-14: qty 1

## 2014-03-14 MED ORDER — BUTAMBEN-TETRACAINE-BENZOCAINE 2-2-14 % EX AERO
INHALATION_SPRAY | CUTANEOUS | Status: DC | PRN
  Administered 2014-03-14: 2 via TOPICAL

## 2014-03-14 MED ORDER — PROPOFOL 10 MG/ML IV BOLUS
INTRAVENOUS | Status: AC
  Filled 2014-03-14: qty 20

## 2014-03-14 MED ORDER — FENTANYL CITRATE 0.05 MG/ML IJ SOLN
INTRAMUSCULAR | Status: AC
  Filled 2014-03-14: qty 2

## 2014-03-14 MED ORDER — MIDAZOLAM HCL 10 MG/2ML IJ SOLN
INTRAMUSCULAR | Status: AC
  Filled 2014-03-14: qty 2

## 2014-03-14 NOTE — H&P (Signed)
  Problem: Dysphagia on Fosamax. Normal barium esophagram performed 01/01/2014. Normal screening colonoscopy performed in 09/22/2004  History: The patient is a 80 year old male born [birth year]. He intermittently experiences esophageal dysphagia localized to the retroxiphoid area unassociated with nausea, vomiting, odynophagia, or abdominal pain. He chronically takes Fosamax. On 01/01/2014, he underwent a normal barium esophagram. He had an ulcer operation in the 1960s to treat stomach ulcers.  The patient stopped taking Plavix one week prior to today's schedule esophagogastroduodenoscopy.  Past medical history: Left hip replacement surgery. Gastric surgery for peptic ulcer disease. Left eye surgery. Right iliac aneurysm embolization and stent-graft. Hypertension. Coronary artery disease. Myocardial infarction. Coronary artery stent. Hypercholesterolemia. Type 2 diabetes mellitus. Benign prostatic hypertrophy. Asthma. Osteoarthritis of the left hip. Colonic diverticulosis. Microscopic hematuria. Prostatitis. Gout. Glaucoma. Left hearing loss. Renal mass surveillance.  Allergies: Codeine causes nausea. Aspirin causes dysuria. Gabapentin causes dizziness.  Exam: The patient is alert and lying comfortably on the endoscopy stretcher. Abdomen is soft and nontender to palpation. Lungs are clear to auscultation. Cardiac exam reveals a regular rhythm  Plan: Proceed with diagnostic esophagogastroduodenoscopy with possible esophageal stricture dilation

## 2014-03-14 NOTE — Op Note (Signed)
Problem: Dysphagia on Fosamax. Normal barium esophagram performed 01/01/2014. Normal screening colonoscopy performed in 09/22/2004  Endoscopist: Earle Gell  Premedication: Versed 5 mg. Fentanyl 50 mcg.  Procedure: Diagnostic esophagogastroduodenoscopy The patient was placed in the left lateral decubitus position. The Pentax gastroscope was passed through the posterior hypopharynx into the proximal esophagus without difficulty. The hypopharynx, larynx, and vocal cords appeared normal.  Esophagoscopy: The proximal, mid, and lower segments of the esophageal mucosa appeared normal. The squamocolumnar junction was regular in appearance and noted at 45 cm from the incisor teeth. There is no endoscopic evidence for the presence of erosive esophagitis, esophageal stricture formation, or Barrett's esophagus. Random esophageal biopsies were performed to look for eosinophilic esophagitis.  Gastroscopy: The patient has undergone a Billroth I and Billroth II gastro enterostomies. There were scattered superficial erosions involving the gastric pouch. The surgical anastomoses appeared normal. Random gastric biopsies were performed to look for H. pylori gastritis.  Enteroscopy: Small intestine examination performed through the Billroth I anastomosis and Billroth II anastomosis appeared normal  Assessment:  #1. Post Billroth I and Billroth II gastrectomies for ulcer disease.  #2. Scattered erosions in the gastric pouch with biopsies pending  #3. Esophageal biopsies to rule out eosinophilic esophagitis pending.

## 2014-03-15 ENCOUNTER — Encounter (HOSPITAL_COMMUNITY): Payer: Self-pay | Admitting: Gastroenterology

## 2014-04-02 DIAGNOSIS — H4011X0 Primary open-angle glaucoma, stage unspecified: Secondary | ICD-10-CM | POA: Diagnosis not present

## 2014-04-02 DIAGNOSIS — H409 Unspecified glaucoma: Secondary | ICD-10-CM | POA: Diagnosis not present

## 2014-04-02 DIAGNOSIS — H2510 Age-related nuclear cataract, unspecified eye: Secondary | ICD-10-CM | POA: Diagnosis not present

## 2014-04-18 DIAGNOSIS — E785 Hyperlipidemia, unspecified: Secondary | ICD-10-CM | POA: Diagnosis not present

## 2014-04-18 DIAGNOSIS — I739 Peripheral vascular disease, unspecified: Secondary | ICD-10-CM | POA: Diagnosis not present

## 2014-04-18 DIAGNOSIS — H9319 Tinnitus, unspecified ear: Secondary | ICD-10-CM | POA: Diagnosis not present

## 2014-04-18 DIAGNOSIS — Z23 Encounter for immunization: Secondary | ICD-10-CM | POA: Diagnosis not present

## 2014-04-18 DIAGNOSIS — I10 Essential (primary) hypertension: Secondary | ICD-10-CM | POA: Diagnosis not present

## 2014-04-18 DIAGNOSIS — R1310 Dysphagia, unspecified: Secondary | ICD-10-CM | POA: Diagnosis not present

## 2014-04-18 DIAGNOSIS — E1159 Type 2 diabetes mellitus with other circulatory complications: Secondary | ICD-10-CM | POA: Diagnosis not present

## 2014-05-01 DIAGNOSIS — K2940 Chronic atrophic gastritis without bleeding: Secondary | ICD-10-CM | POA: Diagnosis not present

## 2014-05-03 DIAGNOSIS — H6120 Impacted cerumen, unspecified ear: Secondary | ICD-10-CM | POA: Diagnosis not present

## 2014-06-03 ENCOUNTER — Other Ambulatory Visit: Payer: Self-pay | Admitting: Vascular Surgery

## 2014-06-03 ENCOUNTER — Encounter: Payer: Self-pay | Admitting: Family

## 2014-06-03 DIAGNOSIS — I723 Aneurysm of iliac artery: Secondary | ICD-10-CM | POA: Diagnosis not present

## 2014-06-04 ENCOUNTER — Ambulatory Visit
Admission: RE | Admit: 2014-06-04 | Discharge: 2014-06-04 | Disposition: A | Discharge status: Home / Self Care | Payer: Medicare Other | Source: Ambulatory Visit | Attending: Family | Admitting: Family

## 2014-06-04 ENCOUNTER — Ambulatory Visit (HOSPITAL_COMMUNITY)
Admission: RE | Admit: 2014-06-04 | Discharge: 2014-06-04 | Disposition: A | Discharge status: Home / Self Care | Payer: Medicare Other | Source: Ambulatory Visit | Attending: Family | Admitting: Family

## 2014-06-04 ENCOUNTER — Ambulatory Visit (INDEPENDENT_AMBULATORY_CARE_PROVIDER_SITE_OTHER): Payer: Medicare Other | Admitting: Family

## 2014-06-04 ENCOUNTER — Encounter: Payer: Self-pay | Admitting: Family

## 2014-06-04 VITALS — BP 117/80 | HR 46 | Resp 14 | Ht 71.0 in | Wt 140.0 lb

## 2014-06-04 DIAGNOSIS — R209 Unspecified disturbances of skin sensation: Secondary | ICD-10-CM

## 2014-06-04 DIAGNOSIS — M79609 Pain in unspecified limb: Secondary | ICD-10-CM

## 2014-06-04 DIAGNOSIS — M79605 Pain in left leg: Secondary | ICD-10-CM

## 2014-06-04 DIAGNOSIS — Z48812 Encounter for surgical aftercare following surgery on the circulatory system: Secondary | ICD-10-CM | POA: Diagnosis not present

## 2014-06-04 DIAGNOSIS — I723 Aneurysm of iliac artery: Secondary | ICD-10-CM | POA: Diagnosis not present

## 2014-06-04 DIAGNOSIS — R200 Anesthesia of skin: Secondary | ICD-10-CM | POA: Insufficient documentation

## 2014-06-04 DIAGNOSIS — R29898 Other symptoms and signs involving the musculoskeletal system: Secondary | ICD-10-CM | POA: Diagnosis not present

## 2014-06-04 LAB — CREATININE, SERUM: Creat: 0.92 mg/dL (ref 0.50–1.35)

## 2014-06-04 LAB — BUN: BUN: 10 mg/dL (ref 6–23)

## 2014-06-04 MED ORDER — IOHEXOL 300 MG/ML  SOLN: 80.0000 mL | Freq: Once | INTRAMUSCULAR | Status: DC | PRN

## 2014-06-04 MED ORDER — IOHEXOL 350 MG/ML SOLN
80.0000 mL | Freq: Once | INTRAVENOUS | Status: AC | PRN
  Administered 2014-06-04: 80 mL via INTRAVENOUS

## 2014-06-04 NOTE — Progress Notes (Signed)
[facility] [place] HISTORY AND PHYSICAL -PAD  History of Present Illness Robert [name] is a 79 y.o. male patient of Dr. Kellie Simmering who is status post stent graft repair for right iliac artery aneurysm in April of 2009.  On his 07/18/12 visit Dr. Kellie Simmering reviewed his CT angiogram. The aortic biiliac stent graft was in excellent position. The right common iliac artery aneurysm had no change in dimension at 3.4 cm. Embolization was previously done to the right internal iliac artery which was stable. The graft terminates in the external iliac artery. Doing well post aortobiiliac stent graft for right common iliac artery aneurysm and Dr. Evelena Leyden note indicates that patient should return in 2 years with CT angiogram to continue to follow.  He returns today for follow up after CT angiogram abd/pelvis and ABI's.  He states that he pulled a muscle in his left hip, being followed by ortho for this, had a left hip replacement.  He denies claudication symptoms, denies non-healing wounds.  He denies any history of stroke or TIA.  He is regularly physically active with yard work and [employer].  Patient denies New Medical or Surgical History other than he cannot afford the $2500 cost of hearing aids.  He noticed a "knot" in his left groin about a month ago, is not tender, is not changing in size, states he will let his PCP know this.   Pt Diabetic: Maybe, he was started on metformin in June or July, 2015. Pt smoker: former smoker, quit 20 years ago  Pt meds include:  Statin :Yes  ASA: No  Other anticoagulants/antiplatelets: Plavix   Past Medical History  Diagnosis Date  . Hypertension   . Hyperlipidemia   . Myocardial infarction 1980s  . CAD (coronary artery disease)     DES to Cfx  . Ulcer   . Small bowel obstruction 10/2011  . PAD (peripheral artery disease)     aortic stent graft 2008  . PUD (peptic ulcer disease)   . History of nuclear stress test 02/2012    lexiscan;  mild-mod perfusion defect in basal inferoseptal, mid inferoseptal apical anterior; no change from previous study, abnormal test   . Atrial fibrillation   . Diabetes mellitus without complication     Social History History  Substance Use Topics  . Smoking status: Former Smoker -- .5 years    Types: Cigarettes    Quit date: 07/18/1992  . Smokeless tobacco: Never Used     Comment: quit around HA time  . Alcohol Use: No    Family History Family History  Problem Relation Age of Onset  . Stroke Father   . Heart disease Father   . Heart disease Daughter     Before age 59    Past Surgical History  Procedure Laterality Date  . Eye surgery Right     cataract surgery  . Joint replacement Left     hip replacement  . Gastrectomy  50 yrs ago - 2 operations    had surgery at the age of 75 for the ulcer  . Cardiac catheterization  1987    occluded marginal branch of RCA, treated medically - Dr Melvern Banker  . Iliac artery aneurysm repair      Repaied by Dr. Kellie Simmering april 2009  . Coronary angioplasty with stent placement  03/2003    2.5x57mm Taxus DES to Cfx  . Transthoracic echocardiogram  11/2009    EF=>55%, mild conc LVH, normal LV systolic function; mild MR; mild-mod  TR with RV systolic pressure elevated at 30-77mmHg; mild-mod AV regurg; mild pulm valve regurg  . Esophagogastroduodenoscopy N/A 03/14/2014    Procedure: ESOPHAGOGASTRODUODENOSCOPY (EGD) with Balloon Dilation;  Surgeon: Garlan Fair, MD;  Location: [facility];  Service: Endoscopy;  Laterality: N/A;    Allergies  Allergen Reactions  . Diona Fanti W/Codeine [Aspirin-Codeine] Other (See Comments)    Irritates his water system    Current Outpatient Prescriptions  Medication Sig Dispense Refill  . brimonidine-timolol (COMBIGAN) 0.2-0.5 % ophthalmic solution Place 1 drop into both eyes daily.      . cephALEXin (KEFLEX) 500 MG capsule Take 1 capsule (500 mg total) by mouth 4 (four) times daily.  40 capsule  0  . clopidogrel  (PLAVIX) 75 MG tablet Take 75 mg by mouth daily.        . finasteride (PROSCAR) 5 MG tablet Take 5 mg by mouth daily.        . furosemide (LASIX) 20 MG tablet Take 20 mg by mouth daily.       Marland Kitchen latanoprost (XALATAN) 0.005 % ophthalmic solution Place 1 drop into the left eye daily.      Marland Kitchen lisinopril (PRINIVIL,ZESTRIL) 40 MG tablet take 1 tablet by mouth once daily  90 tablet  1  . simvastatin (ZOCOR) 20 MG tablet Take 20 mg by mouth 2 (two) times daily.       Marland Kitchen lisinopril (PRINIVIL,ZESTRIL) 20 MG tablet Take 40 mg by mouth daily.        No current facility-administered medications for this visit.    ROS: See HPI for pertinent positives and negatives.   Physical Examination  Filed Vitals:   06/04/14 1245  BP: 117/80  Pulse: 46  Resp: 14  Height: 5\' 11"  (1.803 m)  Weight: 140 lb (63.504 kg)  SpO2: 96%   Body mass index is 19.53 kg/(m^2).  General: A&O x 3, WDWN,  Gait: normal  Eyes: PERRLA,  Pulmonary: CTAB, without wheezes , rales or rhonchi  Cardiac: regular Rythm , with murmur  Carotid Bruits  Left  Right    Negative  Negative   Aorta: is palpable  Radial pulses: 1+ right, 2+ left   VASCULAR EXAM:  Extremities without ischemic changes  without Gangrene; without open wounds.  LE Pulses  LEFT  RIGHT   FEMORAL  3+Palpable 3+palpable   POPLITEAL  not palpable  not palpable   POSTERIOR TIBIAL  not palpable  2+palpable   DORSALIS PEDIS  ANTERIOR TIBIAL  not palpable  faintly palpable    Abdomen: soft, NT, palpable masses in both groins.  Skin: no rashes, no ulcers noted.  Musculoskeletal: no muscle wasting or atrophy.  Neurologic: A&O X 3; Appropriate Affect ; SENSATION: normal; MOTOR FUNCTION: moving all extremities equally, motor strength 5/5 throughout. Speech is fluent/normal. CN 2-12 intact except for very hard of hearing.   CT Angiogram abdomen/pelvis: (06/04/2014): Stable aortoiliac stent graft repair. No endoleak. Stable 3.3 cm  thrombosed right common iliac  aneurysm.    ASSESSMENT: Robert [name] is a 80 y.o. male who is status post stent graft repair for right iliac artery aneurysm in April of 2009.   He has bilateral groin palpable masses about 2 cm x 3 cm. Dr. Kellie Simmering viewed the images of the CTA abdomen/pelvis done this morning and the largest measurement of either common femoral artery is 1.8 cm, not aneurysmal.  PLAN:  I discussed in depth with the patient the nature of atherosclerosis, and emphasized the importance of maximal medical management  including strict control of blood pressure, blood glucose, and lipid levels, obtaining regular exercise, and continued cessation of smoking.  The patient is aware that without maximal medical management the underlying atherosclerotic disease process will progress, limiting the benefit of any interventions.  Based on the patient's vascular studies and examination, and after discussing with Dr. Kellie Simmering,  pt will return to clinic in 1 year with right aortoiliac Duplex. May need to see Dr. Kellie Simmering every other year this to evaluate any further need for CTA abd/pelvis.  The patient was given information about PAD including signs, symptoms, treatment, what symptoms should prompt the patient to seek immediate medical care, and risk reduction measures to take.  Clemon Chambers, RN, MSN, FNP-C [facility] Phone: [phone]  Clinic MD: Early  06/04/2014 12:52 PM

## 2014-07-23 ENCOUNTER — Ambulatory Visit: Payer: Self-pay | Admitting: Vascular Surgery

## 2014-08-06 DIAGNOSIS — H04123 Dry eye syndrome of bilateral lacrimal glands: Secondary | ICD-10-CM | POA: Diagnosis not present

## 2014-08-06 DIAGNOSIS — H4011X1 Primary open-angle glaucoma, mild stage: Secondary | ICD-10-CM | POA: Diagnosis not present

## 2014-08-06 DIAGNOSIS — H2511 Age-related nuclear cataract, right eye: Secondary | ICD-10-CM | POA: Diagnosis not present

## 2014-08-08 ENCOUNTER — Ambulatory Visit: Payer: Medicare Other | Admitting: Internal Medicine

## 2014-09-04 ENCOUNTER — Other Ambulatory Visit: Payer: Self-pay | Admitting: Internal Medicine

## 2014-09-04 NOTE — Telephone Encounter (Signed)
Rx was sent to pharmacy electronically. OV 11/16

## 2014-09-09 ENCOUNTER — Encounter: Payer: Self-pay | Admitting: Internal Medicine

## 2014-09-09 ENCOUNTER — Ambulatory Visit (INDEPENDENT_AMBULATORY_CARE_PROVIDER_SITE_OTHER): Payer: Medicare Other | Admitting: Internal Medicine

## 2014-09-09 VITALS — BP 102/68 | HR 58 | Ht 71.0 in | Wt 143.5 lb

## 2014-09-09 DIAGNOSIS — I714 Abdominal aortic aneurysm, without rupture, unspecified: Secondary | ICD-10-CM

## 2014-09-09 DIAGNOSIS — I2119 ST elevation (STEMI) myocardial infarction involving other coronary artery of inferior wall: Secondary | ICD-10-CM

## 2014-09-09 DIAGNOSIS — I10 Essential (primary) hypertension: Secondary | ICD-10-CM | POA: Diagnosis not present

## 2014-09-09 DIAGNOSIS — I2510 Atherosclerotic heart disease of native coronary artery without angina pectoris: Secondary | ICD-10-CM | POA: Diagnosis not present

## 2014-09-09 NOTE — Patient Instructions (Addendum)
Your physician wants you to follow-up in: 6 months with Dr. Hilty. You will receive a reminder letter in the mail two months in advance. If you don't receive a letter, please call our office to schedule the follow-up appointment.    

## 2014-09-09 NOTE — Progress Notes (Signed)
OFFICE NOTE  Chief Complaint:  Routine follow-up  Primary Care Physician: Robert Shelling, MD  HPI:  Robert [name] is a 80 year-old gentleman with a history of remote inferior MI in the 24s. Catheterization in 1987 showed an occluded marginal branch of the RCA. He was treated medically at that time, but in 2004, he had worsening coronary disease and intervention in the circumflex with a Taxus stent. Since them, he has shown nonreversible defects on his Myoviews with a perfusion defect in the inferoseptal walls consistent with a prior scar. EF is 51%. He was recently in the hospital with a small-bowel obstruction and a CT angiogram was performed, which showed no pulmonary emboli. He also has a history of aortic stent graft, which was placed in 2008. Recently, he had felt funny, but could not describe it much better. He was found to be hypertensive and Robert [name] increased his lisinopril to 30 mg daily and did not recommend any further treatment. Today he returns with persistently elevated blood pressure, 184/104. The blood pressure on a recheck was 150/80. Weight has been stable at around 162 to 163 pounds. At his last office visit I increased his lisinopril to 40 mg daily and his blood pressure now is much better controlled. He reports being asymptomatic and denies any chest pain worsening shortness of breath. His weight has been stable if not down a few pounds.  Mr. Robert [name] returns today for follow-up. He occasionally gets some shortness of breath which she relates to asthma, but does not have an inhaler. He denies any weight gain, in fact she's lost about 20 pounds. Is notable that his blood pressure is borderline low today at 102/68. He scheduled to see his primary care provider later this week for a full physical.  PMHx:  Past Medical History  Diagnosis Date  . Hypertension   . Hyperlipidemia   . Myocardial infarction 1980s  . CAD (coronary artery disease)     DES to Cfx  .  Ulcer   . Small bowel obstruction 10/2011  . PAD (peripheral artery disease)     aortic stent graft 2008  . PUD (peptic ulcer disease)   . History of nuclear stress test 02/2012    lexiscan; mild-mod perfusion defect in basal inferoseptal, mid inferoseptal apical anterior; no change from previous study, abnormal test   . Atrial fibrillation   . Diabetes mellitus without complication     Past Surgical History  Procedure Laterality Date  . Eye surgery Right     cataract surgery  . Joint replacement Left     hip replacement  . Gastrectomy  50 yrs ago - 2 operations    had surgery at the age of 65 for the ulcer  . Cardiac catheterization  1987    occluded marginal branch of RCA, treated medically - Dr Melvern [name]  . Iliac artery aneurysm repair      Repaied by Dr. Kellie [name] april 2009  . Coronary angioplasty with stent placement  03/2003    2.5x73mm Taxus DES to Cfx  . Transthoracic echocardiogram  11/2009    EF=>55%, mild conc LVH, normal LV systolic function; mild MR; mild-mod TR with RV systolic pressure elevated at 30-58mmHg; mild-mod AV regurg; mild pulm valve regurg  . Esophagogastroduodenoscopy N/A 03/14/2014    Procedure: ESOPHAGOGASTRODUODENOSCOPY (EGD) with Balloon Dilation;  Surgeon: Robert Fair, MD;  Location: [facility];  Service: Endoscopy;  Laterality: N/A;    FAMHx:  Family History  Problem Relation Age of Onset  .  Stroke Father   . Heart disease Father   . Heart disease Daughter     Before age 49    SOCHx:   reports that he quit smoking about 22 years ago. His smoking use included Cigarettes. He smoked 0.00 packs per day for .5 years. He has never used smokeless tobacco. He reports that he does not drink alcohol or use illicit drugs.  ALLERGIES:  Allergies  Allergen Reactions  . Asa W/Codeine [Aspirin-Codeine] Other (See Comments)    Irritates his water system    ROS: A comprehensive review of systems was negative.  HOME MEDS: Current Outpatient  Prescriptions  Medication Sig Dispense Refill  . brimonidine-timolol (COMBIGAN) 0.2-0.5 % ophthalmic solution Place 1 drop into both eyes daily.    . cephALEXin (KEFLEX) 500 MG capsule Take 1 capsule (500 mg total) by mouth 4 (four) times daily. 40 capsule 0  . clopidogrel (PLAVIX) 75 MG tablet Take 75 mg by mouth daily.      . finasteride (PROSCAR) 5 MG tablet Take 5 mg by mouth daily.      . furosemide (LASIX) 40 MG tablet Take 1 tablet by mouth daily.  1  . latanoprost (XALATAN) 0.005 % ophthalmic solution Place 1 drop into the left eye daily.    Robert [name] lisinopril (PRINIVIL,ZESTRIL) 40 MG tablet Take 1 tablet (40 mg total) by mouth daily. <Appointment 11/16 with Robert [name] 90 tablet 0  . metFORMIN (GLUCOPHAGE) 1000 MG tablet Take 1 tablet by mouth 2 (two) times daily.  1  . simvastatin (ZOCOR) 20 MG tablet Take 20 mg by mouth 2 (two) times daily.     . simvastatin (ZOCOR) 40 MG tablet Take 1 tablet by mouth daily.  0   No current facility-administered medications for this visit.    LABS/IMAGING: No results found for this or any previous visit (from the past 48 hour(s)). No results found.  VITALS: BP 102/68 mmHg  Pulse 58  Ht 5\' 11"  (1.803 m)  Wt 143 lb 8 oz (65.091 kg)  BMI 20.02 kg/m2  EXAM: General appearance: alert and no distress Neck: no carotid bruit and no JVD Lungs: clear to auscultation bilaterally Heart: regular rate and rhythm, S1, S2 normal, no murmur, click, rub or gallop Abdomen: soft, non-tender; bowel sounds normal; no masses,  no organomegaly Extremities: extremities normal, atraumatic, no cyanosis or edema Pulses: 2+ and symmetric Skin: Skin color, texture, turgor normal. No rashes or lesions Neurologic: Grossly normal Psych: Mood, affect normal  EKG: Sinus bradycardia with first degree AV block at 58, unchanged T-wave abnormalities inferolaterally  ASSESSMENT: 1. Coronary disease with a history of remote inferior MI 2. History PCI to circumflex and known  occluded right coronary artery 3. Dyslipidemia - followed by Dr. Laurann Montana 4. Hypertension - at goal 5. History of stent graft for AAA 6.   Presbycusia  PLAN: 1.   Mr. Rattigan is doing well and has no signs and symptoms of heart failure or chest pain suggestive of coronary ischemia. He is well controlled as far as his hypertension.  At this point, in fact he has borderline hypotension. He has had about 20 pound weight loss and recently is having problems with swallowing. He is undergoing GI workup for this. We may need to consider decreasing his lisinopril down to 20 mg daily. I have asked him to have his blood pressure rechecked at his primary care doctor's office on Thursday and if it remains around 706 systolic, it may be reasonable to decrease his dose.  He denies any angina or heart failure symptoms. Plan to see him back in 6 months for closer follow-up, especially given recent weight loss and the need to possibly adjust medications.  Pixie Casino, MD, [facility] Attending Cardiologist [facility]  HILTY,Kenneth C 09/09/2014, 12:45 PM

## 2014-09-12 DIAGNOSIS — Z23 Encounter for immunization: Secondary | ICD-10-CM | POA: Diagnosis not present

## 2014-09-12 DIAGNOSIS — E1151 Type 2 diabetes mellitus with diabetic peripheral angiopathy without gangrene: Secondary | ICD-10-CM | POA: Diagnosis not present

## 2014-09-12 DIAGNOSIS — I10 Essential (primary) hypertension: Secondary | ICD-10-CM | POA: Diagnosis not present

## 2014-09-26 DIAGNOSIS — K2970 Gastritis, unspecified, without bleeding: Secondary | ICD-10-CM | POA: Diagnosis not present

## 2014-10-02 DIAGNOSIS — K2970 Gastritis, unspecified, without bleeding: Secondary | ICD-10-CM | POA: Diagnosis not present

## 2014-10-02 DIAGNOSIS — B9681 Helicobacter pylori [H. pylori] as the cause of diseases classified elsewhere: Secondary | ICD-10-CM | POA: Diagnosis not present

## 2014-11-07 ENCOUNTER — Encounter (HOSPITAL_COMMUNITY): Payer: Self-pay | Admitting: Gastroenterology

## 2014-12-09 ENCOUNTER — Emergency Department (HOSPITAL_COMMUNITY)
Admission: EM | Admit: 2014-12-09 | Discharge: 2014-12-09 | Disposition: A | Discharge status: Home / Self Care | Payer: Medicare Other | Attending: Emergency Medicine | Admitting: Emergency Medicine

## 2014-12-09 ENCOUNTER — Encounter (HOSPITAL_COMMUNITY): Payer: Self-pay

## 2014-12-09 DIAGNOSIS — E785 Hyperlipidemia, unspecified: Secondary | ICD-10-CM | POA: Insufficient documentation

## 2014-12-09 DIAGNOSIS — Z9889 Other specified postprocedural states: Secondary | ICD-10-CM | POA: Insufficient documentation

## 2014-12-09 DIAGNOSIS — E119 Type 2 diabetes mellitus without complications: Secondary | ICD-10-CM | POA: Diagnosis not present

## 2014-12-09 DIAGNOSIS — R630 Anorexia: Secondary | ICD-10-CM | POA: Diagnosis not present

## 2014-12-09 DIAGNOSIS — Z8711 Personal history of peptic ulcer disease: Secondary | ICD-10-CM | POA: Insufficient documentation

## 2014-12-09 DIAGNOSIS — Z87891 Personal history of nicotine dependence: Secondary | ICD-10-CM | POA: Diagnosis not present

## 2014-12-09 DIAGNOSIS — I4891 Unspecified atrial fibrillation: Secondary | ICD-10-CM | POA: Insufficient documentation

## 2014-12-09 DIAGNOSIS — Z79899 Other long term (current) drug therapy: Secondary | ICD-10-CM | POA: Diagnosis not present

## 2014-12-09 DIAGNOSIS — R197 Diarrhea, unspecified: Secondary | ICD-10-CM | POA: Diagnosis not present

## 2014-12-09 DIAGNOSIS — Z9861 Coronary angioplasty status: Secondary | ICD-10-CM | POA: Diagnosis not present

## 2014-12-09 DIAGNOSIS — I10 Essential (primary) hypertension: Secondary | ICD-10-CM | POA: Diagnosis not present

## 2014-12-09 DIAGNOSIS — Z7902 Long term (current) use of antithrombotics/antiplatelets: Secondary | ICD-10-CM | POA: Insufficient documentation

## 2014-12-09 DIAGNOSIS — I2510 Atherosclerotic heart disease of native coronary artery without angina pectoris: Secondary | ICD-10-CM | POA: Insufficient documentation

## 2014-12-09 DIAGNOSIS — R1030 Lower abdominal pain, unspecified: Secondary | ICD-10-CM | POA: Insufficient documentation

## 2014-12-09 DIAGNOSIS — I252 Old myocardial infarction: Secondary | ICD-10-CM | POA: Insufficient documentation

## 2014-12-09 DIAGNOSIS — R1033 Periumbilical pain: Secondary | ICD-10-CM | POA: Insufficient documentation

## 2014-12-09 DIAGNOSIS — R109 Unspecified abdominal pain: Secondary | ICD-10-CM | POA: Diagnosis present

## 2014-12-09 DIAGNOSIS — R112 Nausea with vomiting, unspecified: Secondary | ICD-10-CM | POA: Diagnosis not present

## 2014-12-09 LAB — COMPREHENSIVE METABOLIC PANEL
ALT: 30 U/L (ref 0–53)
ANION GAP: 12 (ref 5–15)
AST: 37 U/L (ref 0–37)
Albumin: 4 g/dL (ref 3.5–5.2)
Alkaline Phosphatase: 85 U/L (ref 39–117)
BILIRUBIN TOTAL: 0.6 mg/dL (ref 0.3–1.2)
BUN: 12 mg/dL (ref 6–23)
CALCIUM: 9.8 mg/dL (ref 8.4–10.5)
CO2: 25 mmol/L (ref 19–32)
Chloride: 102 mmol/L (ref 96–112)
Creatinine, Ser: 0.93 mg/dL (ref 0.50–1.35)
GFR calc Af Amer: 90 mL/min — ABNORMAL LOW (ref >90)
GFR, EST NON AFRICAN AMERICAN: 77 mL/min — AB (ref >90)
Glucose, Bld: 154 mg/dL — ABNORMAL HIGH (ref 70–99)
Potassium: 4.1 mmol/L (ref 3.5–5.1)
Sodium: 139 mmol/L (ref 135–145)
Total Protein: 8.4 g/dL — ABNORMAL HIGH (ref 6.0–8.3)

## 2014-12-09 LAB — CBC WITH DIFFERENTIAL/PLATELET
Basophils Absolute: 0 10*3/uL (ref 0.0–0.1)
Basophils Relative: 0 % (ref 0–1)
Eosinophils Absolute: 0 10*3/uL (ref 0.0–0.7)
Eosinophils Relative: 0 % (ref 0–5)
HEMATOCRIT: 41.2 % (ref 39.0–52.0)
Hemoglobin: 13.7 g/dL (ref 13.0–17.0)
LYMPHS ABS: 0.7 10*3/uL (ref 0.7–4.0)
Lymphocytes Relative: 9 % — ABNORMAL LOW (ref 12–46)
MCH: 29 pg (ref 26.0–34.0)
MCHC: 33.3 g/dL (ref 30.0–36.0)
MCV: 87.3 fL (ref 78.0–100.0)
Monocytes Absolute: 0.2 10*3/uL (ref 0.1–1.0)
Monocytes Relative: 2 % — ABNORMAL LOW (ref 3–12)
NEUTROS ABS: 6.6 10*3/uL (ref 1.7–7.7)
Neutrophils Relative %: 89 % — ABNORMAL HIGH (ref 43–77)
Platelets: 208 10*3/uL (ref 150–400)
RBC: 4.72 MIL/uL (ref 4.22–5.81)
RDW: 13.6 % (ref 11.5–15.5)
WBC: 7.4 10*3/uL (ref 4.0–10.5)

## 2014-12-09 LAB — URINALYSIS, ROUTINE W REFLEX MICROSCOPIC
Bilirubin Urine: NEGATIVE
Glucose, UA: NEGATIVE mg/dL
HGB URINE DIPSTICK: NEGATIVE
KETONES UR: 15 mg/dL — AB
Leukocytes, UA: NEGATIVE
Nitrite: NEGATIVE
PROTEIN: 30 mg/dL — AB
Specific Gravity, Urine: 1.026 (ref 1.005–1.030)
Urobilinogen, UA: 0.2 mg/dL (ref 0.0–1.0)
pH: 5.5 (ref 5.0–8.0)

## 2014-12-09 LAB — URINE MICROSCOPIC-ADD ON

## 2014-12-09 LAB — LIPASE, BLOOD: LIPASE: 20 U/L (ref 11–59)

## 2014-12-09 MED ORDER — ONDANSETRON HCL 4 MG/2ML IJ SOLN
4.0000 mg | Freq: Once | INTRAMUSCULAR | Status: AC
  Administered 2014-12-09: 4 mg via INTRAVENOUS
  Filled 2014-12-09: qty 2

## 2014-12-09 MED ORDER — SODIUM CHLORIDE 0.9 % IV BOLUS (SEPSIS)
500.0000 mL | Freq: Once | INTRAVENOUS | Status: AC
  Administered 2014-12-09: 500 mL via INTRAVENOUS

## 2014-12-09 MED ORDER — FENTANYL CITRATE 0.05 MG/ML IJ SOLN
40.0000 ug | Freq: Once | INTRAMUSCULAR | Status: AC
  Administered 2014-12-09: 40 ug via INTRAVENOUS
  Filled 2014-12-09: qty 2

## 2014-12-09 MED ORDER — MORPHINE SULFATE 2 MG/ML IJ SOLN: 2.0000 mg | Freq: Once | INTRAMUSCULAR | Status: DC

## 2014-12-09 NOTE — ED Notes (Signed)
Pt reporting abdominal pain that started today.  Sts he was fine yesterday and at about 5 am started throwing up about 2-3 times.  Sts it feels like a "cramp".  Pt also reports some diarrhea.

## 2014-12-09 NOTE — ED Notes (Signed)
Dr. Reather Converse at bedside to attempt IV by [place]. States that he will do assessment of aorta via [place] and if clear will d/c Ct angio order.

## 2014-12-09 NOTE — ED Provider Notes (Signed)
CSN: [id number]     Arrival date & time 12/09/14  1203 History   First MD Initiated Contact with Patient 12/09/14 1319     Chief Complaint  Patient presents with  . Abdominal Pain     (Consider location/radiation/quality/duration/timing/severity/associated sxs/prior Treatment) HPI Comments: 81 year old male with history of gastrectomy from ulcer, CAD, high blood pressure, bowel obstruction, abdominal aneurysm presents with mid abdominal pain since this morning cramping, fairly constant until recently has improved with vomiting and diarrhea nonbloody. Patient feels she's had similar over a year ago cannot recall details. Nonradiating pain. No fevers or chills. Nothing specifically improves or worsens.  Patient is a 81 y.o. male presenting with abdominal pain. The history is provided by the patient and a relative.  Abdominal Pain Associated symptoms: nausea and vomiting   Associated symptoms: no chest pain, no chills, no dysuria, no fever and no shortness of breath     Past Medical History  Diagnosis Date  . Hypertension   . Hyperlipidemia   . Myocardial infarction 1980s  . CAD (coronary artery disease)     DES to Cfx  . Ulcer   . Small bowel obstruction 10/2011  . PAD (peripheral artery disease)     aortic stent graft 2008  . PUD (peptic ulcer disease)   . History of nuclear stress test 02/2012    lexiscan; mild-mod perfusion defect in basal inferoseptal, mid inferoseptal apical anterior; no change from previous study, abnormal test   . Atrial fibrillation   . Diabetes mellitus without complication    Past Surgical History  Procedure Laterality Date  . Eye surgery Right     cataract surgery  . Joint replacement Left     hip replacement  . Gastrectomy  50 yrs ago - 2 operations    had surgery at the age of 55 for the ulcer  . Cardiac catheterization  1987    occluded marginal branch of RCA, treated medically - Dr Melvern Banker  . Iliac artery aneurysm repair      Repaied by Dr.  Kellie Simmering april 2009  . Coronary angioplasty with stent placement  03/2003    2.5x29mm Taxus DES to Cfx  . Transthoracic echocardiogram  11/2009    EF=>55%, mild conc LVH, normal LV systolic function; mild MR; mild-mod TR with RV systolic pressure elevated at 30-43mmHg; mild-mod AV regurg; mild pulm valve regurg  . Esophagogastroduodenoscopy N/A 03/14/2014    Procedure: ESOPHAGOGASTRODUODENOSCOPY (EGD) with Balloon Dilation;  Surgeon: Garlan Fair, MD;  Location: [facility];  Service: Endoscopy;  Laterality: N/A;   Family History  Problem Relation Age of Onset  . Stroke Father   . Heart disease Father   . Heart disease Daughter     Before age 64   History  Substance Use Topics  . Smoking status: Former Smoker -- .5 years    Types: Cigarettes    Quit date: 07/18/1992  . Smokeless tobacco: Never Used     Comment: quit around HA time  . Alcohol Use: No    Review of Systems  Constitutional: Positive for appetite change. Negative for fever and chills.  HENT: Negative for congestion.   Eyes: Negative for visual disturbance.  Respiratory: Negative for shortness of breath.   Cardiovascular: Negative for chest pain.  Gastrointestinal: Positive for nausea, vomiting and abdominal pain.  Genitourinary: Negative for dysuria and flank pain.  Musculoskeletal: Negative for back pain, neck pain and neck stiffness.  Skin: Negative for rash.  Neurological: Negative for light-headedness and headaches.  Allergies  Asa w/codeine  Home Medications   Prior to Admission medications   Medication Sig Start Date End Date Taking? Authorizing Provider  brimonidine-timolol (COMBIGAN) 0.2-0.5 % ophthalmic solution Place 1 drop into both eyes daily.   Yes Historical Provider, MD  cephALEXin (KEFLEX) 500 MG capsule Take 1 capsule (500 mg total) by mouth 4 (four) times daily. Patient not taking: Reported on 12/09/2014 01/09/14   Ezequiel Essex, MD  clopidogrel (PLAVIX) 75 MG tablet Take 75 mg by  mouth daily.     Yes Historical Provider, MD  finasteride (PROSCAR) 5 MG tablet Take 5 mg by mouth daily.     Yes Historical Provider, MD  furosemide (LASIX) 40 MG tablet Take 1 tablet by mouth daily. 08/29/14  Yes Historical Provider, MD  latanoprost (XALATAN) 0.005 % ophthalmic solution Place 1 drop into the left eye daily.   Yes Historical Provider, MD  lisinopril (PRINIVIL,ZESTRIL) 40 MG tablet Take 1 tablet (40 mg total) by mouth daily. <Appointment 11/16 with Dr. Debara Pickett 09/04/14  Yes Pixie Casino, MD  metFORMIN (GLUCOPHAGE) 1000 MG tablet Take 1 tablet by mouth 2 (two) times daily. 08/13/14  Yes Historical Provider, MD  simvastatin (ZOCOR) 40 MG tablet Take 1 tablet by mouth daily. 08/29/14  Yes Historical Provider, MD   BP 149/77 mmHg  Pulse 57  Temp(Src) 97.5 F (36.4 C) (Oral)  Resp 16  SpO2 98% Physical Exam  Constitutional: He is oriented to person, place, and time. He appears well-developed and well-nourished.  HENT:  Head: Normocephalic and atraumatic.  Mild dry mm  Eyes: Conjunctivae are normal. Right eye exhibits no discharge. Left eye exhibits no discharge.  Neck: Normal range of motion. Neck supple. No tracheal deviation present.  Cardiovascular: Normal rate and regular rhythm.   Pulmonary/Chest: Effort normal and breath sounds normal.  Abdominal: Soft. He exhibits no distension. There is tenderness (mild periumbilical). There is no guarding.  Musculoskeletal: He exhibits no edema.  Neurological: He is alert and oriented to person, place, and time.  Skin: Skin is warm. No rash noted.  Psychiatric: He has a normal mood and affect.  Nursing note and vitals reviewed.   ED Course  Procedures (including critical care time) EMERGENCY DEPARTMENT ULTRASOUND  Study: Limited Retroperitoneal Ultrasound of the Abdominal Aorta.  INDICATIONS:Abdominal pain and Age>55 Multiple views of the abdominal aorta were obtained in real-time from the diaphragmatic hiatus to the aortic  bifurcation in transverse planes with a multi-frequency probe. PERFORMED BY: Myself IMAGES ARCHIVED?: Yes FINDINGS: Maximum aortic dimensions are 2.8 cm LIMITATIONS:  Bowel gas INTERPRETATION:  Abdominal aortic aneurysm present right iliac     Labs Review Labs Reviewed  CBC WITH DIFFERENTIAL/PLATELET - Abnormal; Notable for the following:    Neutrophils Relative % 89 (*)    Lymphocytes Relative 9 (*)    Monocytes Relative 2 (*)    All other components within normal limits  COMPREHENSIVE METABOLIC PANEL - Abnormal; Notable for the following:    Glucose, Bld 154 (*)    Total Protein 8.4 (*)    GFR calc non Af Amer 77 (*)    GFR calc Af Amer 90 (*)    All other components within normal limits  URINALYSIS, ROUTINE W REFLEX MICROSCOPIC - Abnormal; Notable for the following:    Ketones, ur 15 (*)    Protein, ur 30 (*)    All other components within normal limits  URINE MICROSCOPIC-ADD ON - Abnormal; Notable for the following:    Casts HYALINE CASTS (*)  All other components within normal limits  LIPASE, BLOOD  I-STAT CHEM 8, ED    Imaging Review No results found.   EKG Interpretation None      MDM   Final diagnoses:  Abdominal pain, lower  Nausea vomiting and diarrhea   Patient presents with central abdominal pain and mild vomiting diarrhea with mild improvement since arrival. With age, aneurysm history and severe pain at home plan for CAT scan for further delineation. Differential includes gastritis, Colitis, a aorta related, other.  On reassessment patient has no abdominal pain, well-appearing. Tolerating oral no vomiting. Discussed with family in detail and patient family myself agree for close follow-up outpatient in 24 hours for reassessment. If pain returns and is persistent or any new concerns patient will need a CAT scan to evaluate his aorta however if he remains pain-free this was likely gastroenteritis.  Family comfortable this plan. Blood work  unremarkable, patient discharged well-appearing.  Results and differential diagnosis were discussed with the patient/parent/guardian. Close follow up outpatient was discussed, comfortable with the plan.   Medications  fentaNYL (SUBLIMAZE) injection 40 mcg (40 mcg Intravenous Given 12/09/14 1359)  sodium chloride 0.9 % bolus 500 mL (500 mLs Intravenous New Bag/Given 12/09/14 1358)  ondansetron (ZOFRAN) injection 4 mg (4 mg Intravenous Given 12/09/14 1358)    Filed Vitals:   12/09/14 1400 12/09/14 1430 12/09/14 1445 12/09/14 1500  BP: 175/90 152/76 154/76 149/77  Pulse: 60 57 64 57  Temp:    97.5 F (36.4 C)  TempSrc:    Oral  Resp:    16  SpO2: 98% 97% 98% 98%    Final diagnoses:  Abdominal pain, lower  Nausea vomiting and diarrhea      Mariea Clonts, MD 12/09/14 1531

## 2014-12-09 NOTE — Discharge Instructions (Signed)
Take Tylenol for pain. Return to the ER or see a physician if your abdominal pain returns and is persistent, you pass out, persistent vomiting, fevers, other.  If you were given medicines take as directed.  If you are on coumadin or contraceptives realize their levels and effectiveness is altered by many different medicines.  If you have any reaction (rash, tongues swelling, other) to the medicines stop taking and see a physician.   Please follow up as directed and return to the ER or see a physician for new or worsening symptoms.  Thank you. Filed Vitals:   12/09/14 1400 12/09/14 1430 12/09/14 1445 12/09/14 1500  BP: 175/90 152/76 154/76 149/77  Pulse: 60 57 64 57  Temp:    97.5 F (36.4 C)  TempSrc:    Oral  Resp:    16  SpO2: 98% 97% 98% 98%

## 2014-12-11 DIAGNOSIS — K529 Noninfective gastroenteritis and colitis, unspecified: Secondary | ICD-10-CM | POA: Diagnosis not present

## 2014-12-11 DIAGNOSIS — E1151 Type 2 diabetes mellitus with diabetic peripheral angiopathy without gangrene: Secondary | ICD-10-CM | POA: Diagnosis not present

## 2014-12-11 DIAGNOSIS — B9681 Helicobacter pylori [H. pylori] as the cause of diseases classified elsewhere: Secondary | ICD-10-CM | POA: Diagnosis not present

## 2014-12-13 DIAGNOSIS — H2511 Age-related nuclear cataract, right eye: Secondary | ICD-10-CM | POA: Diagnosis not present

## 2014-12-13 DIAGNOSIS — H4011X2 Primary open-angle glaucoma, moderate stage: Secondary | ICD-10-CM | POA: Diagnosis not present

## 2014-12-19 DIAGNOSIS — B9681 Helicobacter pylori [H. pylori] as the cause of diseases classified elsewhere: Secondary | ICD-10-CM | POA: Diagnosis not present

## 2015-03-28 DIAGNOSIS — H4011X1 Primary open-angle glaucoma, mild stage: Secondary | ICD-10-CM | POA: Diagnosis not present

## 2015-03-28 DIAGNOSIS — H2511 Age-related nuclear cataract, right eye: Secondary | ICD-10-CM | POA: Diagnosis not present

## 2015-03-28 DIAGNOSIS — H26492 Other secondary cataract, left eye: Secondary | ICD-10-CM | POA: Diagnosis not present

## 2015-04-22 DIAGNOSIS — E119 Type 2 diabetes mellitus without complications: Secondary | ICD-10-CM | POA: Diagnosis not present

## 2015-04-22 DIAGNOSIS — R636 Underweight: Secondary | ICD-10-CM | POA: Diagnosis not present

## 2015-04-22 DIAGNOSIS — E780 Pure hypercholesterolemia: Secondary | ICD-10-CM | POA: Diagnosis not present

## 2015-04-22 DIAGNOSIS — I10 Essential (primary) hypertension: Secondary | ICD-10-CM | POA: Diagnosis not present

## 2015-04-22 DIAGNOSIS — Z0000 Encounter for general adult medical examination without abnormal findings: Secondary | ICD-10-CM | POA: Diagnosis not present

## 2015-04-22 DIAGNOSIS — Z1389 Encounter for screening for other disorder: Secondary | ICD-10-CM | POA: Diagnosis not present

## 2015-04-22 DIAGNOSIS — E1151 Type 2 diabetes mellitus with diabetic peripheral angiopathy without gangrene: Secondary | ICD-10-CM | POA: Diagnosis not present

## 2015-04-22 DIAGNOSIS — I739 Peripheral vascular disease, unspecified: Secondary | ICD-10-CM | POA: Diagnosis not present

## 2015-05-20 ENCOUNTER — Encounter (INDEPENDENT_AMBULATORY_CARE_PROVIDER_SITE_OTHER): Payer: Medicare Other

## 2015-05-20 ENCOUNTER — Encounter: Payer: Self-pay | Admitting: Internal Medicine

## 2015-05-20 ENCOUNTER — Ambulatory Visit (INDEPENDENT_AMBULATORY_CARE_PROVIDER_SITE_OTHER): Payer: Medicare Other | Admitting: Internal Medicine

## 2015-05-20 VITALS — BP 96/62 | HR 45 | Ht 71.0 in | Wt 141.3 lb

## 2015-05-20 DIAGNOSIS — I2119 ST elevation (STEMI) myocardial infarction involving other coronary artery of inferior wall: Secondary | ICD-10-CM | POA: Diagnosis not present

## 2015-05-20 DIAGNOSIS — R5383 Other fatigue: Secondary | ICD-10-CM | POA: Diagnosis not present

## 2015-05-20 DIAGNOSIS — R001 Bradycardia, unspecified: Secondary | ICD-10-CM

## 2015-05-20 DIAGNOSIS — R42 Dizziness and giddiness: Secondary | ICD-10-CM | POA: Diagnosis not present

## 2015-05-20 DIAGNOSIS — I2510 Atherosclerotic heart disease of native coronary artery without angina pectoris: Secondary | ICD-10-CM | POA: Diagnosis not present

## 2015-05-20 DIAGNOSIS — I10 Essential (primary) hypertension: Secondary | ICD-10-CM

## 2015-05-20 DIAGNOSIS — I2583 Coronary atherosclerosis due to lipid rich plaque: Secondary | ICD-10-CM

## 2015-05-20 MED ORDER — LISINOPRIL 20 MG PO TABS: 20.0000 mg | ORAL_TABLET | Freq: Every day | ORAL | Status: DC

## 2015-05-20 NOTE — Patient Instructions (Signed)
Your physician has recommended you make the following change in your medication: DECREASE lisinoprl from 40mg  to 20mg  daily   Your physician recommends that you schedule a follow-up appointment in: 1 month  Your Doctor has ordered you to wear a heart monitor. You will wear this for 14 days.   TIPS -  REMINDERS 1. The sensor is the lanyard that is worn around your neck every day - this is powered by a battery that needs to be changed every day 2. The monitor is the device that allows you to record symptoms - this will need to be charged daily 3. The sensor & monitor need to be within 100 feet of each other at all times 4. The sensor connects to the electrodes (stickers) - these should be changed every 24-48 hours (you do not have to remove them when you bathe, just make sure they are dry when you connect it back to the sensor 5. If you need more supplies (electrodes, batteries), please call the 1-800 # on the back of the pamphlet and [employer] will mail you more supplies 6. If your skin becomes sensitive, please try the sample pack of sensitive skin electrodes (the white packet in your silver box) and call [employer] to have them mail you more of these type of electrodes 7. When you are finish wearing the monitor, please place all supplies back in the silver box, place the silver box in the pre-packaged UPS bag and drop off at [employer] or call them so they can come pick it up   Cardiac Event Monitoring A cardiac event monitor is a small recording device used to help detect abnormal heart rhythms (arrhythmias). The monitor is used to record heart rhythm when noticeable symptoms such as the following occur:  Fast heartbeats (palpitations), such as heart racing or fluttering.  Dizziness.  Fainting or light-headedness.  Unexplained weakness. The monitor is wired to two electrodes placed on your chest. Electrodes are flat, sticky disks that attach to your skin. The monitor can be worn for up to 30  days. You will wear the monitor at all times, except when bathing.  HOW TO USE YOUR CARDIAC EVENT MONITOR A technician will prepare your chest for the electrode placement. The technician will show you how to place the electrodes, how to work the monitor, and how to replace the batteries. Take time to practice using the monitor before you leave the office. Make sure you understand how to send the information from the monitor to your health care provider. This requires a telephone with a landline, not a cell phone. You need to:  Wear your monitor at all times, except when you are in water:  Do not get the monitor wet.  Take the monitor off when bathing. Do not swim or use a hot tub with it on.  Keep your skin clean. Do not put body lotion or moisturizer on your chest.  Change the electrodes daily or any time they stop sticking to your skin. You might need to use tape to keep them on.  It is possible that your skin under the electrodes could become irritated. To keep this from happening, try to put the electrodes in slightly different places on your chest. However, they must remain in the area under your left breast and in the upper right section of your chest.  Make sure the monitor is safely clipped to your clothing or in a location close to your body that your health care provider recommends.  Press the button to record when you feel symptoms of heart trouble, such as dizziness, weakness, light-headedness, palpitations, thumping, shortness of breath, unexplained weakness, or a fluttering or racing heart. The monitor is always on and records what happened slightly before you pressed the button, so do not worry about being too late to get good information.  Keep a diary of your activities, such as walking, doing chores, and taking medicine. It is especially important to note what you were doing when you pushed the button to record your symptoms. This will help your health care provider determine  what might be contributing to your symptoms. The information stored in your monitor will be reviewed by your health care provider alongside your diary entries.  Send the recorded information as recommended by your health care provider. It is important to understand that it will take some time for your health care provider to process the results.  Change the batteries as recommended by your health care provider. SEEK IMMEDIATE MEDICAL CARE IF:   You have chest pain.  You have extreme difficulty breathing or shortness of breath.  You develop a very fast heartbeat that persists.  You develop dizziness that does not go away.  You faint or constantly feel you are about to faint. Document Released: 07/20/2008 Document Revised: 02/25/2014 Document Reviewed: 04/09/2013 [facility] Patient Information 2015 [place], [place]. This information is not intended to replace advice given to you by your health care provider. Make sure you discuss any questions you have with your health care provider.

## 2015-05-20 NOTE — Progress Notes (Signed)
OFFICE NOTE  Chief Complaint:  Routine follow-up, some fatigue  Primary Care Physician: Irven Shelling, MD  HPI:  Robert [name] is a 81 year-old gentleman with a history of remote inferior MI in the 48s. Catheterization in 1987 showed an occluded marginal branch of the RCA. He was treated medically at that time, but in 2004, he had worsening coronary disease and intervention in the circumflex with a Taxus stent. Since them, he has shown nonreversible defects on his Myoviews with a perfusion defect in the inferoseptal walls consistent with a prior scar. EF is 51%. He was recently in the hospital with a small-bowel obstruction and a CT angiogram was performed, which showed no pulmonary emboli. He also has a history of aortic stent graft, which was placed in 2008. Recently, he had felt funny, but could not describe it much better. He was found to be hypertensive and [name] increased his lisinopril to 30 mg daily and did not recommend any further treatment. Today he returns with persistently elevated blood pressure, 184/104. The blood pressure on a recheck was 150/80. Weight has been stable at around 162 to 163 pounds. At his last office visit I increased his lisinopril to 40 mg daily and his blood pressure now is much better controlled. He reports being asymptomatic and denies any chest pain worsening shortness of breath. His weight has been stable if not down a few pounds.  Robert [name] returns today for follow-up. He occasionally gets some shortness of breath which she relates to asthma, but does not have an inhaler. He denies any weight gain, in fact she's lost about 20 pounds. Is notable that his blood pressure is borderline low today at 102/68. He scheduled to see his primary care provider later this week for a full physical.  Robert [name] was seen in the office today. Overall he is doing fairly well but continues to lose weight. His weight is now down to 141 pounds with a BMI of 19.  He says he rarely eats breakfast and eats very lightly throughout the day. There is been no concern for malignancy or other reason for his weight loss from his primary care provider. Because of his weight loss, however we've been reducing his medications. At his last office visit I talked about lowering his lisinopril further and this may be necessary as his blood pressure today is below 324 systolic. Heart rate actually is low as well in the 40s. Reviewing his EKG shows a trend toward bradycardia over the past several years. He reports some occasional dizziness and fatigue and had one episode of chest discomfort at rest which sounded somewhat atypical for angina.  PMHx:  Past Medical History  Diagnosis Date  . Hypertension   . Hyperlipidemia   . Myocardial infarction 1980s  . CAD (coronary artery disease)     DES to Cfx  . Ulcer   . Small bowel obstruction 10/2011  . PAD (peripheral artery disease)     aortic stent graft 2008  . PUD (peptic ulcer disease)   . History of nuclear stress test 02/2012    lexiscan; mild-mod perfusion defect in basal inferoseptal, mid inferoseptal apical anterior; no change from previous study, abnormal test   . Atrial fibrillation   . Diabetes mellitus without complication     Past Surgical History  Procedure Laterality Date  . Eye surgery Right     cataract surgery  . Joint replacement Left     hip replacement  . Gastrectomy  50  yrs ago - 2 operations    had surgery at the age of 67 for the ulcer  . Cardiac catheterization  1987    occluded marginal branch of RCA, treated medically - Dr Melvern Banker  . Iliac artery aneurysm repair      Repaied by Dr. Kellie Simmering april 2009  . Coronary angioplasty with stent placement  03/2003    2.5x53mm Taxus DES to Cfx  . Transthoracic echocardiogram  11/2009    EF=>55%, mild conc LVH, normal LV systolic function; mild MR; mild-mod TR with RV systolic pressure elevated at 30-57mmHg; mild-mod AV regurg; mild pulm valve regurg  .  Esophagogastroduodenoscopy N/A 03/14/2014    Procedure: ESOPHAGOGASTRODUODENOSCOPY (EGD) with Balloon Dilation;  Surgeon: Garlan Fair, MD;  Location: [facility];  Service: Endoscopy;  Laterality: N/A;    FAMHx:  Family History  Problem Relation Age of Onset  . Stroke Father   . Heart disease Father   . Heart disease Daughter     Before age 22    SOCHx:   reports that he quit smoking about 22 years ago. His smoking use included Cigarettes. He quit after .5 years of use. He has never used smokeless tobacco. He reports that he does not drink alcohol or use illicit drugs.  ALLERGIES:  Allergies  Allergen Reactions  . Asa W/Codeine [Aspirin-Codeine] Other (See Comments)    Irritates his water system    ROS: A comprehensive review of systems was negative except for: Constitutional: positive for fatigue Neurological: positive for dizziness  HOME MEDS: Current Outpatient Prescriptions  Medication Sig Dispense Refill  . brimonidine-timolol (COMBIGAN) 0.2-0.5 % ophthalmic solution Place 1 drop into both eyes daily.    . clopidogrel (PLAVIX) 75 MG tablet Take 75 mg by mouth daily.      . finasteride (PROSCAR) 5 MG tablet Take 5 mg by mouth daily.      . furosemide (LASIX) 40 MG tablet Take 1 tablet by mouth daily.  1  . latanoprost (XALATAN) 0.005 % ophthalmic solution Place 1 drop into the left eye daily.    Marland Kitchen lisinopril (PRINIVIL,ZESTRIL) 20 MG tablet Take 1 tablet (20 mg total) by mouth daily. 90 tablet 1  . metFORMIN (GLUCOPHAGE) 1000 MG tablet Take 1 tablet by mouth 2 (two) times daily.  1  . simvastatin (ZOCOR) 40 MG tablet Take 1 tablet by mouth daily.  0   No current facility-administered medications for this visit.    LABS/IMAGING: No results found for this or any previous visit (from the past 48 hour(s)). No results found.  VITALS: BP 96/62 mmHg  Pulse 45  Ht 5\' 11"  (1.803 m)  Wt 141 lb 4.8 oz (64.093 kg)  BMI 19.72 kg/m2  EXAM: General appearance: alert and  no distress Neck: no carotid bruit and no JVD Lungs: clear to auscultation bilaterally Heart: regular rate and rhythm, S1, S2 normal, no murmur, click, rub or gallop Abdomen: soft, non-tender; bowel sounds normal; no masses,  no organomegaly Extremities: extremities normal, atraumatic, no cyanosis or edema Pulses: 2+ and symmetric Skin: Skin color, texture, turgor normal. No rashes or lesions Neurologic: Grossly normal Psych: Mood, affect normal  EKG: Sinus bradycardia with first degree AV block and PACs with inferior T-wave abnormalities at 45  ASSESSMENT: 1. Coronary disease with a history of remote inferior MI 2. History PCI to circumflex and known occluded right coronary artery 3. Dyslipidemia - followed by Dr. Laurann Montana 4. Hypertension - at goal 5. History of stent graft for AAA 6.  Marked sinus bradycardia  PLAN: 1.   Robert [name] has had progressive weight loss and in fact has hypotension and bradycardia in the office today. Recently he's been more fatigued and somewhat dizzy. I recommend decreasing his lisinopril to 20 mg daily. He is not on any AV nodal blocking agents other than Timolol eyedrops. Hopefully we can try to find an alternative for him with regards to his eyedrops. I like to place him on a two-week monitor to make sure he is not having any significant pauses or severe bradycardia that may indicate a need for pacemaker. Plan to see him back in one month.  Pixie Casino, MD, [facility] Attending Cardiologist [place] 05/20/2015, 12:32 PM

## 2015-05-21 ENCOUNTER — Telehealth: Payer: Self-pay | Admitting: Internal Medicine

## 2015-05-21 DIAGNOSIS — R001 Bradycardia, unspecified: Secondary | ICD-10-CM | POA: Diagnosis not present

## 2015-05-21 DIAGNOSIS — R42 Dizziness and giddiness: Secondary | ICD-10-CM | POA: Diagnosis not present

## 2015-05-21 NOTE — Telephone Encounter (Signed)
Robert [name] was here yesterday and received a heart monitor.  Robert [name] has questions about it.

## 2015-05-21 NOTE — Telephone Encounter (Signed)
Call regarding technical issue w/ heart monitor placed yesterday. Advised caller to contact [place] regarding issue - understanding verbalized.

## 2015-06-03 ENCOUNTER — Other Ambulatory Visit: Payer: Self-pay | Admitting: *Deleted

## 2015-06-03 DIAGNOSIS — Z48812 Encounter for surgical aftercare following surgery on the circulatory system: Secondary | ICD-10-CM

## 2015-06-03 DIAGNOSIS — I739 Peripheral vascular disease, unspecified: Secondary | ICD-10-CM

## 2015-06-09 ENCOUNTER — Other Ambulatory Visit: Payer: Self-pay | Admitting: Vascular Surgery

## 2015-06-09 ENCOUNTER — Other Ambulatory Visit: Payer: Self-pay | Admitting: Family

## 2015-06-09 ENCOUNTER — Encounter: Payer: Self-pay | Admitting: Family

## 2015-06-09 DIAGNOSIS — I723 Aneurysm of iliac artery: Secondary | ICD-10-CM

## 2015-06-09 DIAGNOSIS — Z48812 Encounter for surgical aftercare following surgery on the circulatory system: Secondary | ICD-10-CM

## 2015-06-10 ENCOUNTER — Ambulatory Visit (HOSPITAL_COMMUNITY)
Admission: RE | Admit: 2015-06-10 | Discharge: 2015-06-10 | Disposition: A | Discharge status: Home / Self Care | Payer: Medicare Other | Source: Ambulatory Visit | Attending: Family | Admitting: Family

## 2015-06-10 ENCOUNTER — Other Ambulatory Visit (HOSPITAL_COMMUNITY): Payer: Medicare Other

## 2015-06-10 ENCOUNTER — Ambulatory Visit (INDEPENDENT_AMBULATORY_CARE_PROVIDER_SITE_OTHER)
Admission: RE | Admit: 2015-06-10 | Discharge: 2015-06-10 | Disposition: A | Discharge status: Home / Self Care | Payer: Medicare Other | Source: Ambulatory Visit | Attending: Vascular Surgery | Admitting: Vascular Surgery

## 2015-06-10 ENCOUNTER — Ambulatory Visit (INDEPENDENT_AMBULATORY_CARE_PROVIDER_SITE_OTHER): Payer: Medicare Other | Admitting: Family

## 2015-06-10 ENCOUNTER — Ambulatory Visit: Payer: Medicare Other | Admitting: Family

## 2015-06-10 ENCOUNTER — Encounter: Payer: Self-pay | Admitting: Family

## 2015-06-10 VITALS — BP 137/85 | HR 56 | Temp 97.9°F | Resp 14 | Ht 71.0 in | Wt 141.0 lb

## 2015-06-10 DIAGNOSIS — I2510 Atherosclerotic heart disease of native coronary artery without angina pectoris: Secondary | ICD-10-CM | POA: Diagnosis not present

## 2015-06-10 DIAGNOSIS — Z95828 Presence of other vascular implants and grafts: Secondary | ICD-10-CM | POA: Diagnosis not present

## 2015-06-10 DIAGNOSIS — Z87891 Personal history of nicotine dependence: Secondary | ICD-10-CM | POA: Insufficient documentation

## 2015-06-10 DIAGNOSIS — I723 Aneurysm of iliac artery: Secondary | ICD-10-CM | POA: Diagnosis not present

## 2015-06-10 DIAGNOSIS — E118 Type 2 diabetes mellitus with unspecified complications: Secondary | ICD-10-CM | POA: Diagnosis not present

## 2015-06-10 DIAGNOSIS — Z48812 Encounter for surgical aftercare following surgery on the circulatory system: Secondary | ICD-10-CM

## 2015-06-10 DIAGNOSIS — E785 Hyperlipidemia, unspecified: Secondary | ICD-10-CM | POA: Diagnosis not present

## 2015-06-10 DIAGNOSIS — I10 Essential (primary) hypertension: Secondary | ICD-10-CM | POA: Insufficient documentation

## 2015-06-10 NOTE — Progress Notes (Signed)
Filed Vitals:   06/10/15 1031 06/10/15 1040  BP: 145/79 137/85  Pulse: 55 56  Temp: 97.9 F (36.6 C)   TempSrc: Oral   Resp: 14   Height: 5\' 11"  (1.803 m)   Weight: 141 lb (63.957 kg)   SpO2: 99%

## 2015-06-10 NOTE — Patient Instructions (Signed)

## 2015-06-10 NOTE — Progress Notes (Signed)
[facility] [place] HISTORY AND PHYSICAL -PAD  History of Present Illness Robert [name] is a 81 y.o. male  patient of Dr. Kellie Simmering who is status post stent graft repair for right iliac artery aneurysm in April of 2009.  On his 07/18/12 visit Dr. Kellie Simmering reviewed his CT angiogram. The aortic biiliac stent graft was in excellent position. The right common iliac artery aneurysm had no change in dimension at 3.4 cm. Embolization was previously done to the right internal iliac artery which was stable. The graft terminates in the external iliac artery. Doing well post aortobiiliac stent graft for right common iliac artery aneurysm and Dr. Evelena Leyden note indicates that patient should return in 2 years with CT angiogram to continue to follow.  He returns today for follow up.  He states that he pulled a muscle in his left hip, being followed by ortho for this, had a left hip replacement.  He denies claudication symptoms, denies non-healing wounds.  He denies any history of stroke or TIA.  He is regularly physically active with yard work and [employer].  Patient denies New Medical or Surgical History other than he cannot afford the $2500 cost of hearing aids.  Pt denies post prandial abdominal pain, has a good appetite.    Pt Diabetic: possibly, he was started on metformin in June or July, 2015. Pt smoker: former smoker, quit 20 years ago  Pt meds include:  Statin :Yes  ASA: No  Other anticoagulants/antiplatelets: Plavix     Past Medical History  Diagnosis Date  . Hypertension   . Hyperlipidemia   . Myocardial infarction 1980s  . CAD (coronary artery disease)     DES to Cfx  . Ulcer   . Small bowel obstruction 10/2011  . PAD (peripheral artery disease)     aortic stent graft 2008  . PUD (peptic ulcer disease)   . History of nuclear stress test 02/2012    lexiscan; mild-mod perfusion defect in basal inferoseptal, mid inferoseptal apical anterior; no change  from previous study, abnormal test   . Atrial fibrillation   . Diabetes mellitus without complication     Social History Social History  Substance Use Topics  . Smoking status: Former Smoker -- .5 years    Types: Cigarettes    Quit date: 07/18/1992  . Smokeless tobacco: Never Used     Comment: quit around HA time  . Alcohol Use: No    Family History Family History  Problem Relation Age of Onset  . Stroke Father   . Heart disease Father   . Hypertension Father   . Heart disease Daughter     Before age 46  . Diabetes Sister     DVT-Blood clots in vein    Past Surgical History  Procedure Laterality Date  . Eye surgery Right     cataract surgery  . Joint replacement Left     hip replacement  . Gastrectomy  50 yrs ago - 2 operations    had surgery at the age of 66 for the ulcer  . Cardiac catheterization  1987    occluded marginal branch of RCA, treated medically - Dr Melvern Banker  . Iliac artery aneurysm repair      Repaied by Dr. Kellie Simmering april 2009  . Coronary angioplasty with stent placement  03/2003    2.5x81mm Taxus DES to Cfx  . Transthoracic echocardiogram  11/2009    EF=>55%, mild conc LVH, normal LV systolic function; mild MR; mild-mod TR with  RV systolic pressure elevated at 30-35mmHg; mild-mod AV regurg; mild pulm valve regurg  . Esophagogastroduodenoscopy N/A 03/14/2014    Procedure: ESOPHAGOGASTRODUODENOSCOPY (EGD) with Balloon Dilation;  Surgeon: Garlan Fair, MD;  Location: [facility];  Service: Endoscopy;  Laterality: N/A;    Allergies  Allergen Reactions  . Diona Fanti W/Codeine [Aspirin-Codeine] Other (See Comments)    Irritates his water system    Current Outpatient Prescriptions  Medication Sig Dispense Refill  . clopidogrel (PLAVIX) 75 MG tablet Take 75 mg by mouth daily.      . finasteride (PROSCAR) 5 MG tablet Take 5 mg by mouth daily.      . furosemide (LASIX) 40 MG tablet Take 1 tablet by mouth daily.  1  . latanoprost (XALATAN) 0.005 % ophthalmic  solution Place 1 drop into the left eye daily.    Marland Kitchen lisinopril (PRINIVIL,ZESTRIL) 20 MG tablet Take 1 tablet (20 mg total) by mouth daily. 90 tablet 1  . metFORMIN (GLUCOPHAGE) 1000 MG tablet Take 1 tablet by mouth 2 (two) times daily.  1  . simvastatin (ZOCOR) 40 MG tablet Take 1 tablet by mouth daily.  0  . brimonidine-timolol (COMBIGAN) 0.2-0.5 % ophthalmic solution Place 1 drop into both eyes daily.     No current facility-administered medications for this visit.    ROS: See HPI for pertinent positives and negatives.   Physical Examination  Filed Vitals:   06/10/15 1031 06/10/15 1040  BP: 145/79 137/85  Pulse: 55 56  Temp: 97.9 F (36.6 C)   TempSrc: Oral   Resp: 14   Height: 5\' 11"  (1.803 m)   Weight: 141 lb (63.957 kg)   SpO2: 99%    Body mass index is 19.67 kg/(m^2).  General: A&O x 3, WDWN, thin male. Gait: normal  Eyes: PERRLA,  Pulmonary: CTAB, without wheezes , rales or rhonchi  Cardiac: regular Rythm , with murmur   Carotid Bruits  Left  Right    Negative  Negative   Aorta: is palpable  Radial pulses: 1+ right, 2+ left   VASCULAR EXAM:  Extremities without ischemic changes  without Gangrene; without open wounds.  LE Pulses  LEFT  RIGHT   FEMORAL  3+Palpable 3+palpable   POPLITEAL  not palpable  not palpable   POSTERIOR TIBIAL  not palpable  2+palpable   DORSALIS PEDIS  ANTERIOR TIBIAL  not palpable  faintly palpable    Abdomen: soft, NT, no palpable masses.  Skin: no rashes, no ulcers.  Musculoskeletal: no muscle wasting or atrophy.  Neurologic: A&O X 3; Appropriate Affect ; SENSATION: normal; MOTOR FUNCTION: moving all extremities equally, motor strength 5/5 throughout. Speech is fluent/normal. CN 2-12 intact except for very hard of hearing.   CT Angiogram abdomen/pelvis: (06/04/2014): Stable aortoiliac stent graft repair. No endoleak. Stable 3.3 cm  thrombosed right common iliac aneurysm.           Non-Invasive Vascular Imaging: DATE: 06/10/2015 [facility] - ILIAC DUPLEX EVALUATION    INDICATION: Right iliac artery aneurysm    PREVIOUS INTERVENTION(S): Aortic y stent graft placed April 2009    DUPLEX EXAM:      Peak Systolic Velocity (cm/s)  AORTA - Proximal 62  AORTA - Mid 35  AORTA - Distal 37-stent    RIGHT  LEFT  Peak Systolic Velocity (cm/s) Ratio (if abnormal) Waveform  Peak Systolic Velocity (cm/s) Ratio (if abnormal) Waveform  73 STENT B Common Iliac Artery - Proximal 38 STENT T     Common Iliac Artery - Mid  Common Iliac Artery - Distal     56 STENT B External Iliac Artery - Proximal 72 STENT T     External Iliac Artery - Mid        External Iliac Artery - Distal        Internal Iliac Artery     1.19/0.97 Today's ABI / TBI 1.08/0.59  1.12/0.71 Previous ABI / TBI (06/04/2014 ) 1.17/0.58    Waveform:    M - Monophasic       B - Biphasic       T - Triphasic  If Ankle Brachial Index (ABI) or Toe Brachial Index (TBI) performed, please see complete report     ADDITIONAL FINDINGS:     IMPRESSION: Patent aorta and iliac artery stent graft, no hemodynamically significant plaque or stenosis present.    Compared to the previous exam:  Ankle brachial indices are stable since study on 06/04/2014.     ASSESSMENT: Robert [name] is a 81 y.o. male who is status post stent graft repair for right iliac artery aneurysm in April of 2009.  Prominent bilateral femoral pulses, but pt is thin, states he has been thin al lof his life, has a good appetite. Today's aortoiliac Duplex suggests a patent aorta and iliac artery stent graft, no hemodynamically significant plaque or stenosis present. Bilateral ABI's remain normal with bi and triphasic waveforms.   Dr. Kellie Simmering viewed the images of the CTA abdomen/pelvis done at 06/04/14 visit and the largest measurement of either common femoral artery is 1.8 cm, not aneurysmal. May need to see Dr. Kellie Simmering every other year to evaluate  any further need for CTA abd/pelvis   PLAN:  Based on the patient's vascular studies and examination, pt will return to clinic in 1 year with aortoiliac Duplex and follow up with Dr. Kellie Simmering.  I discussed in depth with the patient the nature of atherosclerosis, and emphasized the importance of maximal medical management including strict control of blood pressure, blood glucose, and lipid levels, obtaining regular exercise, and continued cessation of smoking.  The patient is aware that without maximal medical management the underlying atherosclerotic disease process will progress, limiting the benefit of any interventions.  The patient was given information about PAD including signs, symptoms, treatment, what symptoms should prompt the patient to seek immediate medical care, and risk reduction measures to take.  Clemon Chambers, RN, MSN, FNP-C [facility] [employer] Phone: [phone]  Clinic MD: Kellie Simmering  06/10/2015 10:54 AM

## 2015-06-11 NOTE — Addendum Note (Signed)
Addended by: Dorthula Rue L on: 06/11/2015 05:23 PM   Modules accepted: Orders

## 2015-06-12 DIAGNOSIS — H524 Presbyopia: Secondary | ICD-10-CM | POA: Diagnosis not present

## 2015-06-12 DIAGNOSIS — H26492 Other secondary cataract, left eye: Secondary | ICD-10-CM | POA: Diagnosis not present

## 2015-07-17 ENCOUNTER — Ambulatory Visit (INDEPENDENT_AMBULATORY_CARE_PROVIDER_SITE_OTHER): Payer: Medicare Other | Admitting: Internal Medicine

## 2015-07-17 ENCOUNTER — Encounter: Payer: Self-pay | Admitting: Internal Medicine

## 2015-07-17 VITALS — BP 120/76 | HR 55 | Ht 71.0 in | Wt 143.0 lb

## 2015-07-17 DIAGNOSIS — I2510 Atherosclerotic heart disease of native coronary artery without angina pectoris: Secondary | ICD-10-CM

## 2015-07-17 DIAGNOSIS — I2119 ST elevation (STEMI) myocardial infarction involving other coronary artery of inferior wall: Secondary | ICD-10-CM | POA: Diagnosis not present

## 2015-07-17 DIAGNOSIS — R001 Bradycardia, unspecified: Secondary | ICD-10-CM

## 2015-07-17 DIAGNOSIS — I2583 Coronary atherosclerosis due to lipid rich plaque: Principal | ICD-10-CM

## 2015-07-17 DIAGNOSIS — I10 Essential (primary) hypertension: Secondary | ICD-10-CM | POA: Diagnosis not present

## 2015-07-17 NOTE — Patient Instructions (Signed)
Your physician recommends that you schedule a follow-up appointment in: 6 MONTHS 

## 2015-07-17 NOTE — Progress Notes (Signed)
OFFICE NOTE  Chief Complaint:  [name]  Primary Care Physician: Irven Shelling, MD  HPI:  Robert [name] is a 81 year-old gentleman with a history of remote inferior MI in the 58s. Catheterization in 1987 showed an occluded marginal branch of the RCA. He was treated medically at that time, but in 2004, he had worsening coronary disease and intervention in the circumflex with a Taxus stent. Since them, he has shown nonreversible defects on his Myoviews with a perfusion defect in the inferoseptal walls consistent with a prior scar. EF is 51%. He was recently in the hospital with a small-bowel obstruction and a CT angiogram was performed, which showed no pulmonary emboli. He also has a history of aortic stent graft, which was placed in 2008. Recently, he had felt funny, but could not describe it much better. He was found to be hypertensive and Robert [name] increased his lisinopril to 30 mg daily and did not recommend any further treatment. Today he returns with persistently elevated blood pressure, 184/104. The blood pressure on a recheck was 150/80. Weight has been stable at around 162 to 163 pounds. At his last office visit I increased his lisinopril to 40 mg daily and his blood pressure now is much better controlled. He reports being asymptomatic and denies any chest pain worsening shortness of breath. His weight has been stable if not down a few pounds.  Robert [name] returns today for [name]. He occasionally gets some shortness of breath which she relates to asthma, but does not have an inhaler. He denies any weight gain, in fact she's lost about 20 pounds. Is notable that his blood pressure is borderline low today at 102/68. He scheduled to see his primary care provider later this week for a full physical.  Robert [name] was seen in the office today. Overall he is doing fairly well but continues to lose weight. His weight is now down to 141 pounds with a BMI of 19. He says he  rarely eats breakfast and eats very lightly throughout the day. There is been no concern for malignancy or other reason for his weight loss from his primary care provider. Because of his weight loss, however we've been reducing his medications. At his last office visit I talked about lowering his lisinopril further and this may be necessary as his blood pressure today is below 557 systolic. Heart rate actually is low as well in the 40s. Reviewing his EKG shows a trend toward bradycardia over the past several years. He reports some occasional dizziness and fatigue and had one episode of chest discomfort at rest which sounded somewhat atypical for angina.  Robert [name] of his monitor. He wore this for bradycardia. This demonstrated a sinus bradycardia with a heart rate as low as 39 mostly at night. He does not recall any symptoms related to this. He says some of his dizziness has improved. I asked to see if his eye doctor would stop his [place] eyedrops and he subsequently been switched to another type of eyedrop. Heart rate is somewhat higher today at 55, therefore this medication may have been affecting heart rate. He should certainly avoid any AV nodal blocking agents, but I do not see a clear indication for pacemaker at this time.  PMHx:  Past Medical History  Diagnosis Date  . Hypertension   . Hyperlipidemia   . Myocardial infarction 1980s  . CAD (coronary artery disease)     DES to Cfx  .  Ulcer   . Small bowel obstruction 10/2011  . PAD (peripheral artery disease)     aortic stent graft 2008  . PUD (peptic ulcer disease)   . History of nuclear stress test 02/2012    lexiscan; mild-mod perfusion defect in basal inferoseptal, mid inferoseptal apical anterior; no change from previous study, abnormal test   . Atrial fibrillation   . Diabetes mellitus without complication     Past Surgical History  Procedure Laterality Date  . Eye surgery Right     cataract surgery    . Joint replacement Left     hip replacement  . Gastrectomy  50 yrs ago - 2 operations    had surgery at the age of 62 for the ulcer  . Cardiac catheterization  1987    occluded marginal branch of RCA, treated medically - Dr Melvern Banker  . Iliac artery aneurysm repair      Repaied by Dr. Kellie Simmering april 2009  . Coronary angioplasty with stent placement  03/2003    2.5x60mm Taxus DES to Cfx  . Transthoracic echocardiogram  11/2009    EF=>55%, mild conc LVH, normal LV systolic function; mild MR; mild-mod TR with RV systolic pressure elevated at 30-82mmHg; mild-mod AV regurg; mild pulm valve regurg  . Esophagogastroduodenoscopy N/A 03/14/2014    Procedure: ESOPHAGOGASTRODUODENOSCOPY (EGD) with Balloon Dilation;  Surgeon: Garlan Fair, MD;  Location: [facility];  Service: Endoscopy;  Laterality: N/A;    FAMHx:  Family History  Problem Relation Age of Onset  . Stroke Father   . Heart disease Father   . Hypertension Father   . Heart disease Daughter     Before age 27  . Diabetes Sister     DVT-Blood clots in vein    SOCHx:   reports that he quit smoking about 23 years ago. His smoking use included Cigarettes. He quit after .5 years of use. He has never used smokeless tobacco. He reports that he does not drink alcohol or use illicit drugs.  ALLERGIES:  Allergies  Allergen Reactions  . Asa W/Codeine [Aspirin-Codeine] Other (See Comments)    Irritates his water system    ROS: A comprehensive review of systems was negative.  HOME MEDS: Current Outpatient Prescriptions  Medication Sig Dispense Refill  . brimonidine-timolol (COMBIGAN) 0.2-0.5 % ophthalmic solution Place 1 drop into both eyes daily.    . clopidogrel (PLAVIX) 75 MG tablet Take 75 mg by mouth daily.      . finasteride (PROSCAR) 5 MG tablet Take 5 mg by mouth daily.      . furosemide (LASIX) 40 MG tablet Take 1 tablet by mouth daily.  1  . latanoprost (XALATAN) 0.005 % ophthalmic solution Place 1 drop into the left eye  daily.    Marland Kitchen lisinopril (PRINIVIL,ZESTRIL) 20 MG tablet Take 1 tablet (20 mg total) by mouth daily. 90 tablet 1  . metFORMIN (GLUCOPHAGE) 1000 MG tablet Take 1 tablet by mouth 2 (two) times daily.  1  . simvastatin (ZOCOR) 40 MG tablet Take 1 tablet by mouth daily.  0   No current facility-administered medications for this visit.    LABS/IMAGING: No results found for this or any previous visit (from the past 48 hour(s)). No results found.  VITALS: BP 120/76 mmHg  Pulse 55  Ht 5\' 11"  (1.803 m)  Wt 143 lb (64.864 kg)  BMI 19.95 kg/m2  EXAM: Deferred  EKG: Sinus rhythm with first-degree AV block at 55  ASSESSMENT: 1. Coronary disease with a history  of remote inferior MI 2. History PCI to circumflex and known occluded right coronary artery 3. Dyslipidemia - followed by Dr. Laurann Montana 4. Hypertension - at goal 5. History of stent graft for AAA 6.   Sinus bradycardia - asymptomatic, improved  PLAN: 1.   Robert [name] has had some improvement in his sinus bradycardia with the change in his timolol eyedrops and time. The monitor failed to show any significant pauses or heart block. Heart rate was as low as 30 9 at night however he was asymptomatic and asleep. It seems his heart rate is improved somewhat and he is not currently having symptoms there for pacemaker is not indicated. Weight seems to stabilize which is good.  Plan to see him back in 6 months.  Pixie Casino, MD, [facility] Attending Cardiologist [place] 07/17/2015, 1:34 PM

## 2015-07-21 DIAGNOSIS — Z23 Encounter for immunization: Secondary | ICD-10-CM | POA: Diagnosis not present

## 2015-07-28 DIAGNOSIS — M109 Gout, unspecified: Secondary | ICD-10-CM | POA: Diagnosis not present

## 2015-09-05 DIAGNOSIS — R079 Chest pain, unspecified: Secondary | ICD-10-CM | POA: Diagnosis not present

## 2015-09-19 ENCOUNTER — Encounter (HOSPITAL_COMMUNITY): Payer: Self-pay | Admitting: Vascular Surgery

## 2015-09-19 ENCOUNTER — Emergency Department (HOSPITAL_COMMUNITY): Payer: Medicare Other

## 2015-09-19 ENCOUNTER — Emergency Department (HOSPITAL_COMMUNITY)
Admission: EM | Admit: 2015-09-19 | Discharge: 2015-09-20 | Disposition: A | Discharge status: Home / Self Care | Payer: Medicare Other | Attending: Emergency Medicine | Admitting: Emergency Medicine

## 2015-09-19 DIAGNOSIS — I10 Essential (primary) hypertension: Secondary | ICD-10-CM | POA: Diagnosis not present

## 2015-09-19 DIAGNOSIS — Z79899 Other long term (current) drug therapy: Secondary | ICD-10-CM | POA: Insufficient documentation

## 2015-09-19 DIAGNOSIS — I2510 Atherosclerotic heart disease of native coronary artery without angina pectoris: Secondary | ICD-10-CM | POA: Insufficient documentation

## 2015-09-19 DIAGNOSIS — R001 Bradycardia, unspecified: Secondary | ICD-10-CM

## 2015-09-19 DIAGNOSIS — E119 Type 2 diabetes mellitus without complications: Secondary | ICD-10-CM | POA: Insufficient documentation

## 2015-09-19 DIAGNOSIS — R531 Weakness: Secondary | ICD-10-CM | POA: Diagnosis not present

## 2015-09-19 DIAGNOSIS — Z87891 Personal history of nicotine dependence: Secondary | ICD-10-CM | POA: Diagnosis not present

## 2015-09-19 DIAGNOSIS — I252 Old myocardial infarction: Secondary | ICD-10-CM | POA: Diagnosis not present

## 2015-09-19 DIAGNOSIS — R002 Palpitations: Secondary | ICD-10-CM | POA: Insufficient documentation

## 2015-09-19 DIAGNOSIS — R42 Dizziness and giddiness: Secondary | ICD-10-CM | POA: Diagnosis not present

## 2015-09-19 DIAGNOSIS — Z7902 Long term (current) use of antithrombotics/antiplatelets: Secondary | ICD-10-CM | POA: Diagnosis not present

## 2015-09-19 DIAGNOSIS — Z8711 Personal history of peptic ulcer disease: Secondary | ICD-10-CM | POA: Insufficient documentation

## 2015-09-19 DIAGNOSIS — R079 Chest pain, unspecified: Secondary | ICD-10-CM | POA: Diagnosis not present

## 2015-09-19 DIAGNOSIS — E785 Hyperlipidemia, unspecified: Secondary | ICD-10-CM | POA: Diagnosis not present

## 2015-09-19 LAB — I-STAT TROPONIN, ED: TROPONIN I, POC: 0 ng/mL (ref 0.00–0.08)

## 2015-09-19 LAB — CBC
HCT: 39.4 % (ref 39.0–52.0)
Hemoglobin: 12.4 g/dL — ABNORMAL LOW (ref 13.0–17.0)
MCH: 28.2 pg (ref 26.0–34.0)
MCHC: 31.5 g/dL (ref 30.0–36.0)
MCV: 89.5 fL (ref 78.0–100.0)
PLATELETS: 218 10*3/uL (ref 150–400)
RBC: 4.4 MIL/uL (ref 4.22–5.81)
RDW: 13.8 % (ref 11.5–15.5)
WBC: 4 10*3/uL (ref 4.0–10.5)

## 2015-09-19 LAB — BASIC METABOLIC PANEL
Anion gap: 10 (ref 5–15)
BUN: 10 mg/dL (ref 6–20)
CALCIUM: 9.6 mg/dL (ref 8.9–10.3)
CO2: 28 mmol/L (ref 22–32)
CREATININE: 1.14 mg/dL (ref 0.61–1.24)
Chloride: 100 mmol/L — ABNORMAL LOW (ref 101–111)
GFR calc non Af Amer: 58 mL/min — ABNORMAL LOW (ref >60)
GLUCOSE: 128 mg/dL — AB (ref 65–99)
Potassium: 4.3 mmol/L (ref 3.5–5.1)
Sodium: 138 mmol/L (ref 135–145)

## 2015-09-19 LAB — TROPONIN I: Troponin I: <0.03 ng/mL (ref <0.031)

## 2015-09-19 NOTE — ED Notes (Signed)
Pt reports to the ED for eval of midsternal CP that began today. He reports associated symptoms of lightheadedness, dizziness, palpitations, and some intermittent SOB. Pt describes the pain as a discomfort. 12 lead obtained in triage. Pt A&Ox4, resp e/u, and skin warm and dry. EMS came last week for the same and they did an EKG and stated it looked ok so he stayed at home.

## 2015-09-19 NOTE — ED Provider Notes (Signed)
CSN: [id number]     Arrival date & time 09/19/15  1647 History   First MD Initiated Contact with Patient 09/19/15 2142     Chief Complaint  Patient presents with  . Chest Pain     (Consider location/radiation/quality/duration/timing/severity/associated sxs/prior Treatment) Patient is a 81 y.o. male presenting with dizziness.  Dizziness Quality:  Lightheadedness Severity:  Mild Onset quality:  Sudden Duration:  1 hour Progression:  Resolved Chronicity:  New Context: not when bending over, not with ear pain and not with head movement   Relieved by: time. Worsened by:  Nothing Ineffective treatments:  None tried Associated symptoms: palpitations and weakness   Associated symptoms: no chest pain, no diarrhea, no nausea, no shortness of breath, no syncope, no tinnitus, no vision changes and no vomiting   Risk factors: no anemia     Past Medical History  Diagnosis Date  . Hypertension   . Hyperlipidemia   . Myocardial infarction ([place]) 1980s  . CAD (coronary artery disease)     DES to Cfx  . Ulcer   . Small bowel obstruction ([place]) 10/2011  . PAD (peripheral artery disease) ([place])     aortic stent graft 2008  . PUD (peptic ulcer disease)   . History of nuclear stress test 02/2012    lexiscan; mild-mod perfusion defect in basal inferoseptal, mid inferoseptal apical anterior; no change from previous study, abnormal test   . Atrial fibrillation ([place])   . Diabetes mellitus without complication [facility])    Past Surgical History  Procedure Laterality Date  . Eye surgery Right     cataract surgery  . Joint replacement Left     hip replacement  . Gastrectomy  50 yrs ago - 2 operations    had surgery at the age of 54 for the ulcer  . Cardiac catheterization  1987    occluded marginal branch of RCA, treated medically - Dr Melvern Banker  . Iliac artery aneurysm repair      Repaied by Dr. Kellie Simmering april 2009  . Coronary angioplasty with stent placement  03/2003    2.5x44mm Taxus DES to Cfx  .  Transthoracic echocardiogram  11/2009    EF=>55%, mild conc LVH, normal LV systolic function; mild MR; mild-mod TR with RV systolic pressure elevated at 30-44mmHg; mild-mod AV regurg; mild pulm valve regurg  . Esophagogastroduodenoscopy N/A 03/14/2014    Procedure: ESOPHAGOGASTRODUODENOSCOPY (EGD) with Balloon Dilation;  Surgeon: Garlan Fair, MD;  Location: [facility];  Service: Endoscopy;  Laterality: N/A;   Family History  Problem Relation Age of Onset  . Stroke Father   . Heart disease Father   . Hypertension Father   . Heart disease Daughter     Before age 31  . Diabetes Sister     DVT-Blood clots in vein   Social History  Substance Use Topics  . Smoking status: Former Smoker -- .5 years    Types: Cigarettes    Quit date: 07/18/1992  . Smokeless tobacco: Never Used     Comment: quit around HA time  . Alcohol Use: No    Review of Systems  Constitutional: Negative for fever, chills and fatigue.  HENT: Negative for tinnitus.   Respiratory: Negative for shortness of breath.   Cardiovascular: Positive for palpitations. Negative for chest pain and syncope.  Gastrointestinal: Negative for nausea, vomiting and diarrhea.  Neurological: Positive for dizziness and weakness.  All other systems reviewed and are negative.     Allergies  Asa w/codeine  Home Medications  Prior to Admission medications   Medication Sig Start Date End Date Taking? Authorizing Provider  brimonidine-timolol (COMBIGAN) 0.2-0.5 % ophthalmic solution Place 1 drop into both eyes daily.    Historical Provider, MD  clopidogrel (PLAVIX) 75 MG tablet Take 75 mg by mouth daily.      Historical Provider, MD  finasteride (PROSCAR) 5 MG tablet Take 5 mg by mouth daily.      Historical Provider, MD  furosemide (LASIX) 40 MG tablet Take 1 tablet by mouth daily. 08/29/14   Historical Provider, MD  latanoprost (XALATAN) 0.005 % ophthalmic solution Place 1 drop into the left eye daily.    Historical Provider, MD   lisinopril (PRINIVIL,ZESTRIL) 20 MG tablet Take 1 tablet (20 mg total) by mouth daily. 05/20/15   Pixie Casino, MD  metFORMIN (GLUCOPHAGE) 1000 MG tablet Take 1 tablet by mouth 2 (two) times daily. 08/13/14   Historical Provider, MD  simvastatin (ZOCOR) 40 MG tablet Take 1 tablet by mouth daily. 08/29/14   Historical Provider, MD   BP 121/72 mmHg  Pulse 53  Temp(Src) 98 F (36.7 C) (Oral)  Resp 18  SpO2 98% Physical Exam  Constitutional: He is oriented to person, place, and time. He appears well-developed and well-nourished.  HENT:  Head: Normocephalic and atraumatic.  Eyes: Conjunctivae are normal. Pupils are equal, round, and reactive to light.  Neck: Normal range of motion.  Cardiovascular: Normal rate.   Pulmonary/Chest: Effort normal and breath sounds normal. No respiratory distress.  Abdominal: Soft. He exhibits no distension.  Musculoskeletal: Normal range of motion. He exhibits no edema or tenderness.  Neurological: He is alert and oriented to person, place, and time.  No altered mental status, able to give full seemingly accurate history.  Face is symmetric, EOM's intact, pupils equal and reactive, vision intact, tongue and uvula midline without deviation Upper and Lower extremity motor 5/5, intact pain perception in distal extremities, 2+ reflexes in biceps, patella and achilles tendons. Finger to nose normal, heel to shin normal.   Skin: Skin is warm and dry. No rash noted. No erythema.  Nursing note and vitals reviewed.   ED Course  Procedures (including critical care time) Labs Review Labs Reviewed  BASIC METABOLIC PANEL - Abnormal; Notable for the following:    Chloride 100 (*)    Glucose, Bld 128 (*)    GFR calc non Af Amer 58 (*)    All other components within normal limits  CBC - Abnormal; Notable for the following:    Hemoglobin 12.4 (*)    All other components within normal limits  I-STAT TROPOININ, ED    Imaging Review Dg Chest 2 View  09/19/2015   CLINICAL DATA:  Chest pain EXAM: CHEST  2 VIEW COMPARISON:  02/15/2012 chest radiograph. FINDINGS: Stable cardiomediastinal silhouette with normal heart size. No pneumothorax. No pleural effusion. Hyperinflated lungs. Suggestion of emphysematous change in the upper lungs. No pulmonary edema. No focal lung consolidation. Curvilinear opacity at the posterior right lung base. Surgical clips are again seen at the esophagogastric junction per IMPRESSION: 1. Hyperinflated lungs and emphysematous change, suggesting COPD. 2. Curvilinear opacity at the posterior right lung base, favor scarring versus atelectasis. No focal lung consolidation to suggest a pneumonia. Electronically Signed   By: Ilona Sorrel M.D.   On: 09/19/2015 17:27   I have personally reviewed and evaluated these images and lab results as part of my medical decision-making.   EKG Interpretation   Date/Time:  Friday September 19 2015 17:00:34 EST  Ventricular Rate:  70 PR Interval:  200 QRS Duration: 92 QT Interval:  380 QTC Calculation: 410 R Axis:   11 Text Interpretation:  Normal sinus rhythm Nonspecific ST and T wave  abnormality Abnormal ECG improving LVH and repol abnormality compared to  01/2012 Confirmed by [facility] MD, Corene Cornea [id number]) on 09/19/2015 5:00:41 PM      MDM   Final diagnoses:  Bradycardia  Dizzy   81 year old male history coronary artery disease presents emergency department today secondary to dizziness. His chief complaint says chest pain however he has not had any chest pain in the last 2 weeks. He states he went outside and was working at a acute onset of palpitations associated with dizziness and not feeling well, this slowly resolved and has been gone for multiple hours prior to evaluation here. My examination patient's neuro exam is normal. Heart exam is normal lungs are clear. Unsure of the cause of his episode however feel he would benefit from a heart monitor to evaluate for any arrhythmias. A troponin was done  in triage a second one was done prior to discharge just to ensure this was not cardiac related. The chance of this being ACS is very low as he has not had any chest pain, shortness of breath, nausea or vomiting. No neuro exam deficits to suggest central nervous disease. Patient with bradycardia in the emergency department as low as 45 however I review his records this is been worked up in the past and is asymptomatic. He is asymptomatic at this time and I do not feel is related to his chief complaint here. Discussed with the patient and his wife and they'll return here if he has return of symptoms at any point or if he has any new symptoms such as chest pain or shortness of breath.      Merrily Pew, MD 09/19/15 [phone]

## 2015-09-19 NOTE — ED Notes (Signed)
Patient left at this time with all belongings. 

## 2015-09-19 NOTE — ED Notes (Addendum)
Pt is bradycardic with a heart rate of 43. Pt denies dizziness or lightheadedness. EKG done and Mesner, MD notified.

## 2015-09-19 NOTE — ED Notes (Signed)
Mesner, MD at bedside.

## 2015-09-29 DIAGNOSIS — M109 Gout, unspecified: Secondary | ICD-10-CM | POA: Diagnosis not present

## 2015-11-28 ENCOUNTER — Other Ambulatory Visit: Payer: Self-pay | Admitting: Internal Medicine

## 2015-11-28 DIAGNOSIS — H25811 Combined forms of age-related cataract, right eye: Secondary | ICD-10-CM | POA: Diagnosis not present

## 2015-11-28 DIAGNOSIS — H401131 Primary open-angle glaucoma, bilateral, mild stage: Secondary | ICD-10-CM | POA: Diagnosis not present

## 2015-12-01 NOTE — Telephone Encounter (Signed)
Rx(s) sent to pharmacy electronically.  

## 2016-01-22 ENCOUNTER — Other Ambulatory Visit: Payer: Self-pay | Admitting: Internal Medicine

## 2016-01-22 NOTE — Telephone Encounter (Signed)
Rx request sent to pharmacy.  

## 2016-03-04 ENCOUNTER — Other Ambulatory Visit: Payer: Self-pay | Admitting: Internal Medicine

## 2016-03-17 DIAGNOSIS — H401131 Primary open-angle glaucoma, bilateral, mild stage: Secondary | ICD-10-CM | POA: Diagnosis not present

## 2016-03-17 DIAGNOSIS — H26492 Other secondary cataract, left eye: Secondary | ICD-10-CM | POA: Diagnosis not present

## 2016-03-17 DIAGNOSIS — H25811 Combined forms of age-related cataract, right eye: Secondary | ICD-10-CM | POA: Diagnosis not present

## 2016-03-25 DIAGNOSIS — E538 Deficiency of other specified B group vitamins: Secondary | ICD-10-CM | POA: Diagnosis not present

## 2016-03-25 DIAGNOSIS — E7800 Pure hypercholesterolemia, unspecified: Secondary | ICD-10-CM | POA: Diagnosis not present

## 2016-03-25 DIAGNOSIS — M79672 Pain in left foot: Secondary | ICD-10-CM | POA: Diagnosis not present

## 2016-03-25 DIAGNOSIS — Z7984 Long term (current) use of oral hypoglycemic drugs: Secondary | ICD-10-CM | POA: Diagnosis not present

## 2016-03-25 DIAGNOSIS — M79671 Pain in right foot: Secondary | ICD-10-CM | POA: Diagnosis not present

## 2016-03-25 DIAGNOSIS — Z79899 Other long term (current) drug therapy: Secondary | ICD-10-CM | POA: Diagnosis not present

## 2016-03-25 DIAGNOSIS — E1151 Type 2 diabetes mellitus with diabetic peripheral angiopathy without gangrene: Secondary | ICD-10-CM | POA: Diagnosis not present

## 2016-05-11 DIAGNOSIS — M5432 Sciatica, left side: Secondary | ICD-10-CM | POA: Diagnosis not present

## 2016-05-11 DIAGNOSIS — E119 Type 2 diabetes mellitus without complications: Secondary | ICD-10-CM | POA: Diagnosis not present

## 2016-05-11 DIAGNOSIS — E538 Deficiency of other specified B group vitamins: Secondary | ICD-10-CM | POA: Diagnosis not present

## 2016-05-11 DIAGNOSIS — I10 Essential (primary) hypertension: Secondary | ICD-10-CM | POA: Diagnosis not present

## 2016-05-11 DIAGNOSIS — Z1389 Encounter for screening for other disorder: Secondary | ICD-10-CM | POA: Diagnosis not present

## 2016-06-14 ENCOUNTER — Encounter: Payer: Self-pay | Admitting: Vascular Surgery

## 2016-06-15 ENCOUNTER — Ambulatory Visit: Payer: Medicare Other | Admitting: Vascular Surgery

## 2016-06-15 ENCOUNTER — Encounter (HOSPITAL_COMMUNITY): Payer: Medicare Other

## 2016-06-16 ENCOUNTER — Ambulatory Visit (INDEPENDENT_AMBULATORY_CARE_PROVIDER_SITE_OTHER): Payer: Commercial Managed Care - HMO | Admitting: Vascular Surgery

## 2016-06-16 ENCOUNTER — Encounter: Payer: Self-pay | Admitting: Vascular Surgery

## 2016-06-16 ENCOUNTER — Ambulatory Visit (HOSPITAL_COMMUNITY)
Admission: RE | Admit: 2016-06-16 | Discharge: 2016-06-16 | Disposition: A | Discharge status: Home / Self Care | Payer: Commercial Managed Care - HMO | Source: Ambulatory Visit | Attending: Vascular Surgery | Admitting: Vascular Surgery

## 2016-06-16 VITALS — BP 125/78 | HR 51 | Temp 99.9°F | Resp 20 | Ht 71.0 in | Wt 143.0 lb

## 2016-06-16 DIAGNOSIS — E785 Hyperlipidemia, unspecified: Secondary | ICD-10-CM | POA: Insufficient documentation

## 2016-06-16 DIAGNOSIS — Z87891 Personal history of nicotine dependence: Secondary | ICD-10-CM | POA: Diagnosis not present

## 2016-06-16 DIAGNOSIS — I2510 Atherosclerotic heart disease of native coronary artery without angina pectoris: Secondary | ICD-10-CM | POA: Diagnosis not present

## 2016-06-16 DIAGNOSIS — E1151 Type 2 diabetes mellitus with diabetic peripheral angiopathy without gangrene: Secondary | ICD-10-CM | POA: Insufficient documentation

## 2016-06-16 DIAGNOSIS — I723 Aneurysm of iliac artery: Secondary | ICD-10-CM

## 2016-06-16 DIAGNOSIS — I10 Essential (primary) hypertension: Secondary | ICD-10-CM | POA: Diagnosis not present

## 2016-06-16 NOTE — Progress Notes (Signed)
Patient name: Robert [name] MRN: [id number] DOB: [birth year] Sex: male  REASON FOR CONSULT: follow up   HPI: Robert [name] is a 82 y.o. male, who is a patient of Dr. Alexander Mt, who underwent stent graft repair of a right common iliac artery aneurysm. Was last seen in our office by the nurse practitioner on 06/10/2015. At time the stent was widely patent with no problems noted. The patient was set up for a 1 year follow up visit. Of note I reviewed his CT scan from 2015 and a looks like the aneurysm was repaired with a bifurcated aortoiliac graft. I cannot find the operative report from 2009.  She was seen in our office last, he denies any history of abdominal pain or back pain. He is not a smoker.  Past Medical History:  Diagnosis Date  . Atrial fibrillation ([place])   . CAD (coronary artery disease)    DES to Cfx  . Diabetes mellitus without complication ([place])   . History of nuclear stress test 02/2012   lexiscan; mild-mod perfusion defect in basal inferoseptal, mid inferoseptal apical anterior; no change from previous study, abnormal test   . Hyperlipidemia   . Hypertension   . Myocardial infarction ([place]) 1980s  . PAD (peripheral artery disease) ([place])    aortic stent graft 2008  . PUD (peptic ulcer disease)   . Small bowel obstruction ([place]) 10/2011  . Ulcer     Family History  Problem Relation Age of Onset  . Stroke Father   . Heart disease Father   . Hypertension Father   . Heart disease Daughter     Before age 74  . Diabetes Sister     DVT-Blood clots in vein    SOCIAL HISTORY: Social History   Social History  . Marital status: Widowed    Spouse name: N/A  . Number of children: N/A  . Years of education: N/A   Occupational History  . Not on file.   Social History Main Topics  . Smoking status: Former Smoker    Years: 0.50    Types: Cigarettes    Quit date: 07/18/1992  . Smokeless tobacco: Never Used     Comment: quit around HA time  . Alcohol use No    . Drug use: No  . Sexual activity: Not on file   Other Topics Concern  . 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    Allergies  Allergen Reactions  . Diona Fanti W/Codeine [Aspirin-Codeine] Other (See Comments)    Irritates his water system    Current Outpatient Prescriptions  Medication Sig Dispense Refill  . ALPHAGAN P 0.1 % SOLN Place 1 drop into both eyes at bedtime.   0  . brimonidine-timolol (COMBIGAN) 0.2-0.5 % ophthalmic solution Place 1 drop into both eyes daily.    . clopidogrel (PLAVIX) 75 MG tablet Take 75 mg by mouth daily.      . finasteride (PROSCAR) 5 MG tablet Take 5 mg by mouth daily.      . furosemide (LASIX) 40 MG tablet Take 40 mg by mouth daily.   1  . latanoprost (XALATAN) 0.005 % ophthalmic solution Place 1 drop into the left eye at bedtime.     Marland Kitchen lisinopril (PRINIVIL,ZESTRIL) 20 MG tablet take 1 tablet by mouth once daily **PLEASE SCHEDULE APPOINTMENT FOR REFILLS** 90 tablet 0  . metFORMIN (GLUCOPHAGE) 500 MG tablet Take 500 mg by mouth  2 (two) times daily.  1  . simvastatin (ZOCOR) 40 MG tablet Take 40 mg by mouth daily.   0   No current facility-administered medications for this visit.     REVIEW OF SYSTEMS:  [X]  denotes positive finding, [ ]  denotes negative finding Cardiac  Comments:  Chest pain or chest pressure: X Posterior chest which she describes as musculoskeletal.  Shortness of breath upon exertion:    Short of breath when lying flat:    Irregular heart rhythm: X       Vascular    Pain in calf, thigh, or hip brought on by ambulation: X   Pain in feet at night that wakes you up from your sleep:     Blood clot in your veins:    Leg swelling:         Pulmonary    Oxygen at home:    Productive cough:     Wheezing:         Neurologic    Sudden weakness in arms or legs:     Sudden numbness in arms or legs:     Sudden onset of difficulty speaking or slurred speech:    Temporary  loss of vision in one eye:     Problems with dizziness:         Gastrointestinal    Blood in stool:     Vomited blood:         Genitourinary    Burning when urinating:     Blood in urine:        Psychiatric    Major depression:         Hematologic    Bleeding problems:    Problems with blood clotting too easily:        Skin    Rashes or ulcers:        Constitutional    Fever or chills:      PHYSICAL EXAM: Vitals:   06/16/16 0933  BP: 125/78  Pulse: (!) 51  Resp: 20  Temp: 99.9 F (37.7 C)  TempSrc: Oral  SpO2: 100%  Weight: 143 lb (64.9 kg)  Height: 5\' 11"  (1.803 m)    GENERAL: The patient is a well-nourished male, in no acute distress. The vital signs are documented above. CARDIAC: There is a regular rate and rhythm.  VASCULAR: I do not detect carotid bruits. He has palpable femoral pulses. I cannot palpate pedal pulses. Both feet are warm and well-perfused. PULMONARY: There is good air exchange bilaterally without wheezing or rales. ABDOMEN: Soft and non-tender with normal pitched bowel sounds.  MUSCULOSKELETAL: There are no major deformities or cyanosis. NEUROLOGIC: No focal weakness or paresthesias are detected. SKIN: There are no ulcers or rashes noted. PSYCHIATRIC: The patient has a normal affect.  DATA:   DUPLEX OF ABDOMINAL AORTA: Inability and interpreted the duplex of the abdominal aorta. The abdominal aorta measures 2.5 cm in maximum diameter. The right common iliac artery measures 2.8 cm in maximum diameter. The left common iliac artery measures 1.8 cm in maximum diameter.  MEDICAL ISSUES:  FOLLOW UP AFTER ENDOVASCULAR REPAIR OF RIGHT COMMON ILIAC ARTERY ANEURYSM: the aneurysm measured 3.3 cm in maximum diameter in 2015. By duplex today it is 2.8 cm. I have recommended follow duplex can in 1 year. He is thin and I think we can continue his follow up with ultrasound. Fortunately he is not a smoker. I have encouraged him to stay as active as  possible. He will call sooner  if he has any problems.   Deitra Mayo [facility] [phone]

## 2016-06-23 ENCOUNTER — Encounter: Payer: Self-pay | Admitting: Internal Medicine

## 2016-06-23 ENCOUNTER — Ambulatory Visit (INDEPENDENT_AMBULATORY_CARE_PROVIDER_SITE_OTHER): Payer: Medicare Other | Admitting: Internal Medicine

## 2016-06-23 VITALS — BP 110/60 | HR 88 | Ht 71.0 in | Wt 140.0 lb

## 2016-06-23 DIAGNOSIS — R5383 Other fatigue: Secondary | ICD-10-CM | POA: Diagnosis not present

## 2016-06-23 DIAGNOSIS — I493 Ventricular premature depolarization: Secondary | ICD-10-CM

## 2016-06-23 DIAGNOSIS — I2510 Atherosclerotic heart disease of native coronary artery without angina pectoris: Secondary | ICD-10-CM | POA: Diagnosis not present

## 2016-06-23 DIAGNOSIS — R001 Bradycardia, unspecified: Secondary | ICD-10-CM | POA: Diagnosis not present

## 2016-06-23 NOTE — Patient Instructions (Addendum)
Your physician has requested that you have an echocardiogram @ [address] - [address]. Echocardiography is a painless test that uses sound waves to create images of your heart. It provides your doctor with information about the size and shape of your heart and how well your heart's chambers and valves are working. This procedure takes approximately one hour. There are no restrictions for this procedure.  Your physician has requested that you have a lexiscan myoview. For further information please visit [email or web address]. Please follow instruction sheet, as given.  Your physician recommends that you schedule a follow-up appointment with Dr. Debara Pickett after your testing.   You have been referred to Dr. Allegra Lai (after stress test & echo) - frequent PVCs, history of symptomatic bradycardia on beta-blockers

## 2016-06-23 NOTE — Progress Notes (Signed)
OFFICE NOTE  Chief Complaint:  Frequent palpitations, fatigue, chest discomfort  Primary Care Physician: Irven Shelling, MD  HPI:  Robert [name] is a 82 year-old gentleman with a history of remote inferior MI in the 78s. Catheterization in 1987 showed an occluded marginal branch of the RCA. He was treated medically at that time, but in 2004, he had worsening coronary disease and intervention in the circumflex with a Taxus stent. Since them, he has shown nonreversible defects on his Myoviews with a perfusion defect in the inferoseptal walls consistent with a prior scar. EF is 51%. He was recently in the hospital with a small-bowel obstruction and a CT angiogram was performed, which showed no pulmonary emboli. He also has a history of aortic stent graft, which was placed in 2008. Recently, he had felt funny, but could not describe it much better. He was found to be hypertensive and [name] increased his lisinopril to 30 mg daily and did not recommend any further treatment. Today he returns with persistently elevated blood pressure, 184/104. The blood pressure on a recheck was 150/80. Weight has been stable at around 162 to 163 pounds. At his last office visit I increased his lisinopril to 40 mg daily and his blood pressure now is much better controlled. He reports being asymptomatic and denies any chest pain worsening shortness of breath. His weight has been stable if not down a few pounds.  Mr. Suchanek returns today for follow-up. He occasionally gets some shortness of breath which she relates to asthma, but does not have an inhaler. He denies any weight gain, in fact she's lost about 20 pounds. Is notable that his blood pressure is borderline low today at 102/68. He scheduled to see his primary care provider later this week for a full physical.  Mr. Scacco was seen in the office today. Overall he is doing fairly well but continues to lose weight. His weight is now down to 141 pounds  with a BMI of 19. He says he rarely eats breakfast and eats very lightly throughout the day. There is been no concern for malignancy or other reason for his weight loss from his primary care provider. Because of his weight loss, however we've been reducing his medications. At his last office visit I talked about lowering his lisinopril further and this may be necessary as his blood pressure today is below [id number] systolic. Heart rate actually is low as well in the 40s. Reviewing his EKG shows a trend toward bradycardia over the past several years. He reports some occasional dizziness and fatigue and had one episode of chest discomfort at rest which sounded somewhat atypical for angina.  Mr. Dellacroce returns today for follow-up of his monitor. He wore this for bradycardia. This demonstrated a sinus bradycardia with a heart rate as low as 39 mostly at night. He does not recall any symptoms related to this. He says some of his dizziness has improved. I asked to see if his eye doctor would stop his [place] eyedrops and he subsequently been switched to another type of eyedrop. Heart rate is somewhat higher today at 55, therefore this medication may have been affecting heart rate. He should certainly avoid any AV nodal blocking agents, but I do not see a clear indication for pacemaker at this time.  06/23/2016  Mr. Folino is seen today back in follow-up. His wife reported that he's been having more frequent palpitations and made an appointment. EKG today shows sinus rhythm with frequent PVCs at  88. He's been off of all beta blockers due to bradycardia with heart rate as low as the 30s in the past. Now these having breakthrough PVCs I'm concerned about either ischemic etiology, or related to prior scar with his MI in the past. He could also have a cardiomyopathy causing his symptoms. He does report some fatigue and chest discomfort as well. Options for management of his PVCs are limited given bradycardia in the past with  beta blockers and he may be only a candidate for either antiarrhythmic therapy or ablation if his PVCs are not ischemically mediated.  PMHx:  Past Medical History:  Diagnosis Date  . Atrial fibrillation ([place])   . CAD (coronary artery disease)    DES to Cfx  . Diabetes mellitus without complication ([place])   . History of nuclear stress test 02/2012   lexiscan; mild-mod perfusion defect in basal inferoseptal, mid inferoseptal apical anterior; no change from previous study, abnormal test   . Hyperlipidemia   . Hypertension   . Myocardial infarction ([place]) 1980s  . PAD (peripheral artery disease) ([place])    aortic stent graft 2008  . PUD (peptic ulcer disease)   . Small bowel obstruction ([place]) 10/2011  . Ulcer     Past Surgical History:  Procedure Laterality Date  . CARDIAC CATHETERIZATION  1987   occluded marginal branch of RCA, treated medically - Dr Melvern Banker  . CORONARY ANGIOPLASTY WITH STENT PLACEMENT  03/2003   2.5x60mm Taxus DES to Cfx  . ESOPHAGOGASTRODUODENOSCOPY N/A 03/14/2014   Procedure: ESOPHAGOGASTRODUODENOSCOPY (EGD) with Balloon Dilation;  Surgeon: Garlan Fair, MD;  Location: [facility];  Service: Endoscopy;  Laterality: N/A;  . EYE SURGERY Right    cataract surgery  . GASTRECTOMY  50 yrs ago - 2 operations   had surgery at the age of 86 for the ulcer  . ILIAC ARTERY ANEURYSM REPAIR     Repaied by Dr. Kellie Simmering april 2009  . JOINT REPLACEMENT Left    hip replacement  . TRANSTHORACIC ECHOCARDIOGRAM  11/2009   EF=>55%, mild conc LVH, normal LV systolic function; mild MR; mild-mod TR with RV systolic pressure elevated at 30-30mmHg; mild-mod AV regurg; mild pulm valve regurg    FAMHx:  Family History  Problem Relation Age of Onset  . Stroke Father   . Heart disease Father   . Hypertension Father   . Heart disease Daughter     Before age 59  . Diabetes Sister     DVT-Blood clots in vein    SOCHx:   reports that he quit smoking about 23 years ago. His smoking use  included Cigarettes. He quit after 0.50 years of use. He has never used smokeless tobacco. He reports that he does not drink alcohol or use drugs.  ALLERGIES:  Allergies  Allergen Reactions  . Asa W/Codeine [Aspirin-Codeine] Other (See Comments)    Irritates his water system    ROS: Pertinent items noted in HPI and remainder of comprehensive ROS otherwise negative.  HOME MEDS: Current Outpatient Prescriptions  Medication Sig Dispense Refill  . ALPHAGAN P 0.1 % SOLN Place 1 drop into both eyes at bedtime.   0  . brimonidine-timolol (COMBIGAN) 0.2-0.5 % ophthalmic solution Place 1 drop into both eyes daily.    . clopidogrel (PLAVIX) 75 MG tablet Take 75 mg by mouth daily.      . finasteride (PROSCAR) 5 MG tablet Take 5 mg by mouth daily.      . furosemide (LASIX) 40 MG tablet Take 40  mg by mouth daily.   1  . latanoprost (XALATAN) 0.005 % ophthalmic solution Place 1 drop into the left eye at bedtime.     Marland Kitchen lisinopril (PRINIVIL,ZESTRIL) 20 MG tablet take 1 tablet by mouth once daily **PLEASE SCHEDULE APPOINTMENT FOR REFILLS** 90 tablet 0  . metFORMIN (GLUCOPHAGE) 500 MG tablet Take 500 mg by mouth 2 (two) times daily.  1  . simvastatin (ZOCOR) 40 MG tablet Take 40 mg by mouth daily.   0   No current facility-administered medications for this visit.     LABS/IMAGING: No results found for this or any previous visit (from the past 48 hour(s)). No results found.  VITALS: BP 110/60   Pulse 88   Ht 5\' 11"  (1.803 m)   Wt 140 lb (63.5 kg)   BMI 19.53 kg/m   EXAM: General appearance: alert, no distress and thin Lungs: clear to auscultation bilaterally Heart: irregular rhythm Pulses: 2+ and symmetric Neurologic: Grossly normal  EKG: Sinus rhythm with frequent PVCs at 88  ASSESSMENT: 1. Frequent symptomatic PVCs 2. Chest discomfort and fatigue 3. Coronary disease with a history of remote inferior MI 4. History PCI to circumflex and known occluded right coronary  artery 5. Dyslipidemia - followed by Dr. Laurann Montana 6. Hypertension - at goal 7. History of stent graft for AAA 6.   Sinus bradycardia - asymptomatic, improved off b-blockers  PLAN: 1.   Mr. Ocasio was having problems with sinus bradycardia and heart rate in the 30s but that improved off of beta blockers. Heart rate is now in the 80s however he's having frequent PVCs. I like to rule out an ischemic etiology of this and would recommend a stress test and echocardiogram. Based on these findings further treatment might be necessary even heart catheterization. If it does not demonstrate ischemia or any new cardiomyopathy, then he may need EP testing or antiarrhythmic therapy for treatment of his PVCs. I'll refer him to Dr. Melodye Ped after this testing for evaluation.  Pixie Casino, MD, [facility] Attending Cardiologist [place] 06/23/2016, 5:24 PM

## 2016-06-25 DIAGNOSIS — Z96642 Presence of left artificial hip joint: Secondary | ICD-10-CM | POA: Diagnosis not present

## 2016-06-25 DIAGNOSIS — M7062 Trochanteric bursitis, left hip: Secondary | ICD-10-CM | POA: Diagnosis not present

## 2016-06-25 DIAGNOSIS — Z471 Aftercare following joint replacement surgery: Secondary | ICD-10-CM | POA: Diagnosis not present

## 2016-06-29 ENCOUNTER — Encounter: Payer: Self-pay | Admitting: Cardiology

## 2016-07-05 DIAGNOSIS — R21 Rash and other nonspecific skin eruption: Secondary | ICD-10-CM | POA: Diagnosis not present

## 2016-07-05 DIAGNOSIS — I739 Peripheral vascular disease, unspecified: Secondary | ICD-10-CM | POA: Diagnosis not present

## 2016-07-05 DIAGNOSIS — M109 Gout, unspecified: Secondary | ICD-10-CM | POA: Diagnosis not present

## 2016-07-05 DIAGNOSIS — R634 Abnormal weight loss: Secondary | ICD-10-CM | POA: Diagnosis not present

## 2016-07-05 DIAGNOSIS — R001 Bradycardia, unspecified: Secondary | ICD-10-CM | POA: Diagnosis not present

## 2016-07-06 ENCOUNTER — Telehealth (HOSPITAL_COMMUNITY): Payer: Self-pay

## 2016-07-06 NOTE — Telephone Encounter (Signed)
Encounter complete. 

## 2016-07-07 ENCOUNTER — Ambulatory Visit (HOSPITAL_COMMUNITY)
Admission: RE | Admit: 2016-07-07 | Discharge: 2016-07-07 | Disposition: A | Discharge status: Home / Self Care | Payer: Commercial Managed Care - HMO | Source: Ambulatory Visit | Attending: Cardiovascular Disease | Admitting: Cardiovascular Disease

## 2016-07-07 DIAGNOSIS — Z87891 Personal history of nicotine dependence: Secondary | ICD-10-CM | POA: Insufficient documentation

## 2016-07-07 DIAGNOSIS — I2510 Atherosclerotic heart disease of native coronary artery without angina pectoris: Secondary | ICD-10-CM

## 2016-07-07 DIAGNOSIS — R5383 Other fatigue: Secondary | ICD-10-CM

## 2016-07-07 DIAGNOSIS — R079 Chest pain, unspecified: Secondary | ICD-10-CM | POA: Diagnosis not present

## 2016-07-07 DIAGNOSIS — I493 Ventricular premature depolarization: Secondary | ICD-10-CM

## 2016-07-07 DIAGNOSIS — E119 Type 2 diabetes mellitus without complications: Secondary | ICD-10-CM | POA: Insufficient documentation

## 2016-07-07 DIAGNOSIS — R002 Palpitations: Secondary | ICD-10-CM | POA: Insufficient documentation

## 2016-07-07 DIAGNOSIS — Z8249 Family history of ischemic heart disease and other diseases of the circulatory system: Secondary | ICD-10-CM | POA: Insufficient documentation

## 2016-07-07 DIAGNOSIS — I10 Essential (primary) hypertension: Secondary | ICD-10-CM | POA: Insufficient documentation

## 2016-07-07 LAB — MYOCARDIAL PERFUSION IMAGING
CHL CUP RESTING HR STRESS: 50 {beats}/min
CSEPPHR: 84 {beats}/min
LVDIAVOL: 149 mL (ref 62–150)
LVSYSVOL: 71 mL
SDS: 0
SRS: 3
SSS: 3
TID: 1.41

## 2016-07-07 MED ORDER — TECHNETIUM TC 99M TETROFOSMIN IV KIT
31.6000 | PACK | Freq: Once | INTRAVENOUS | Status: AC | PRN
  Administered 2016-07-07: 31.6 via INTRAVENOUS
  Filled 2016-07-07: qty 32

## 2016-07-07 MED ORDER — REGADENOSON 0.4 MG/5ML IV SOLN
0.4000 mg | Freq: Once | INTRAVENOUS | Status: AC
  Administered 2016-07-07: 0.4 mg via INTRAVENOUS

## 2016-07-07 MED ORDER — AMINOPHYLLINE 25 MG/ML IV SOLN
75.0000 mg | Freq: Once | INTRAVENOUS | Status: AC
  Administered 2016-07-07: 75 mg via INTRAVENOUS

## 2016-07-07 MED ORDER — TECHNETIUM TC 99M TETROFOSMIN IV KIT
10.4000 | PACK | Freq: Once | INTRAVENOUS | Status: AC | PRN
  Administered 2016-07-07: 10.4 via INTRAVENOUS
  Filled 2016-07-07: qty 10

## 2016-07-08 DIAGNOSIS — M25552 Pain in left hip: Secondary | ICD-10-CM | POA: Diagnosis not present

## 2016-07-12 ENCOUNTER — Other Ambulatory Visit: Payer: Self-pay

## 2016-07-12 ENCOUNTER — Ambulatory Visit (HOSPITAL_COMMUNITY): Payer: Commercial Managed Care - HMO | Attending: Cardiology

## 2016-07-12 DIAGNOSIS — I493 Ventricular premature depolarization: Secondary | ICD-10-CM | POA: Diagnosis not present

## 2016-07-12 DIAGNOSIS — I501 Left ventricular failure: Secondary | ICD-10-CM | POA: Insufficient documentation

## 2016-07-12 DIAGNOSIS — I2510 Atherosclerotic heart disease of native coronary artery without angina pectoris: Secondary | ICD-10-CM

## 2016-07-12 DIAGNOSIS — R5383 Other fatigue: Secondary | ICD-10-CM | POA: Diagnosis not present

## 2016-07-12 DIAGNOSIS — I77810 Thoracic aortic ectasia: Secondary | ICD-10-CM | POA: Diagnosis not present

## 2016-07-12 LAB — ECHOCARDIOGRAM COMPLETE
Ao-asc: 40 cm
FS: 37 % (ref 28–44)
IVS/LV PW RATIO, ED: 1.01
LA ID, A-P, ES: 27 mm
LA diam end sys: 27 mm
LA diam index: 1.49 cm/m2
LA vol A4C: 26 ml
LA vol index: 21 mL/m2
LA vol: 38 mL
LV PW d: 10.1 mm — AB (ref 0.6–1.1)
LVOT VTI: 13.6 cm
LVOT peak vel: 69.3 cm/s
Lateral S' vel: 7.65 cm/s
P 1/2 time: 373 ms
RV sys press: 38 mmHg
Reg peak vel: 272 cm/s
TR max vel: 272 cm/s

## 2016-07-14 ENCOUNTER — Encounter: Payer: Self-pay | Admitting: Cardiology

## 2016-07-14 ENCOUNTER — Ambulatory Visit (INDEPENDENT_AMBULATORY_CARE_PROVIDER_SITE_OTHER): Payer: Commercial Managed Care - HMO | Admitting: Cardiology

## 2016-07-14 VITALS — BP 110/64 | HR 64 | Ht 71.0 in | Wt 140.8 lb

## 2016-07-14 DIAGNOSIS — I493 Ventricular premature depolarization: Secondary | ICD-10-CM | POA: Diagnosis not present

## 2016-07-14 NOTE — Progress Notes (Signed)
Electrophysiology Office Note   Date:  07/14/2016   ID:  Robert [name], Robert [name] September 25, 1932, MRN [id number] a PCP:  Irven Shelling, MD  Cardiologist:  Debara Pickett Primary Electrophysiologist:  Will Meredith Leeds, MD    Chief Complaint  Patient presents with  . New Patient (Initial Visit)     History of Present Illness: Robert [name] is a 82 y.o. male who presents today for electrophysiology evaluation.   He has a history of remote inferior MI in the 4s. In 2004 he had a circumflex stent placed. He had an aortic stent graft in 2008. He also has atrial fibrillation, diabetes, hypertension, hyperlipidemia. He was seen by Dr. Debara Pickett in August for palpitations. At that time, he was in sinus rhythm with frequent PVCs. He has a history of bradycardia and therefore does not take beta blockers. It was thought that his PVCs could be ischemic in nature, but stress testing was low risk.   Today, he denies symptoms of palpitations, chest pain, shortness of breath, orthopnea, PND, lower extremity edema, claudication, dizziness, presyncope, syncope, bleeding, or neurologic sequela. The patient is tolerating medications without difficulties and is otherwise without complaint today. He says that he gets palpitations maybe once a week. There are no exacerbating or alleviating factors. He has not noted any chest pain, shortness of breath, PND, orthopnea associated with his palpitations. He says that they last up to a few hours when he has them.   Past Medical History:  Diagnosis Date  . Atrial fibrillation ([place])   . CAD (coronary artery disease)    DES to Cfx  . Diabetes mellitus without complication ([place])   . History of nuclear stress test 02/2012   lexiscan; mild-mod perfusion defect in basal inferoseptal, mid inferoseptal apical anterior; no change from previous study, abnormal test   . Hyperlipidemia   . Hypertension   . Myocardial infarction ([place]) 1980s  . PAD (peripheral artery disease) ([place])      aortic stent graft 2008  . PUD (peptic ulcer disease)   . Small bowel obstruction ([place]) 10/2011  . Ulcer    Past Surgical History:  Procedure Laterality Date  . CARDIAC CATHETERIZATION  1987   occluded marginal branch of RCA, treated medically - Dr Melvern Banker  . CORONARY ANGIOPLASTY WITH STENT PLACEMENT  03/2003   2.5x49mm Taxus DES to Cfx  . ESOPHAGOGASTRODUODENOSCOPY N/A 03/14/2014   Procedure: ESOPHAGOGASTRODUODENOSCOPY (EGD) with Balloon Dilation;  Surgeon: Garlan Fair, MD;  Location: [facility];  Service: Endoscopy;  Laterality: N/A;  . EYE SURGERY Right    cataract surgery  . GASTRECTOMY  50 yrs ago - 2 operations   had surgery at the age of 38 for the ulcer  . ILIAC ARTERY ANEURYSM REPAIR     Repaied by Dr. Kellie Simmering april 2009  . JOINT REPLACEMENT Left    hip replacement  . TRANSTHORACIC ECHOCARDIOGRAM  11/2009   EF=>55%, mild conc LVH, normal LV systolic function; mild MR; mild-mod TR with RV systolic pressure elevated at 30-17mmHg; mild-mod AV regurg; mild pulm valve regurg     Current Outpatient Prescriptions  Medication Sig Dispense Refill  . ALPHAGAN P 0.1 % SOLN Place 1 drop into both eyes at bedtime.   0  . brimonidine-timolol (COMBIGAN) 0.2-0.5 % ophthalmic solution Place 1 drop into both eyes daily.    . clopidogrel (PLAVIX) 75 MG tablet Take 75 mg by mouth daily.      . finasteride (PROSCAR) 5 MG tablet Take 5 mg by mouth daily.      Marland Kitchen  furosemide (LASIX) 40 MG tablet Take 40 mg by mouth daily.   1  . latanoprost (XALATAN) 0.005 % ophthalmic solution Place 1 drop into the left eye at bedtime.     Marland Kitchen lisinopril (PRINIVIL,ZESTRIL) 20 MG tablet take 1 tablet by mouth once daily **PLEASE SCHEDULE APPOINTMENT FOR REFILLS** 90 tablet 0  . metFORMIN (GLUCOPHAGE) 500 MG tablet Take 500 mg by mouth 2 (two) times daily.  1  . simvastatin (ZOCOR) 40 MG tablet Take 40 mg by mouth daily.   0   No current facility-administered medications for this visit.     Allergies:   Asa  w/codeine [aspirin-codeine]   Social History:  The patient  reports that he quit smoking about 24 years ago. His smoking use included Cigarettes. He quit after 0.50 years of use. He has never used smokeless tobacco. He reports that he does not drink alcohol or use drugs.   Family History:  The patient's family history includes Diabetes in his sister; Heart disease in his daughter and father; Hypertension in his father; Stroke in his father.    ROS:  Please see the history of present illness.   Otherwise, review of systems is positive for weight change, leg swelling, palpitations, hearing loss, muscle pain, joint swelling, leg pain, chest pressure.   All other systems are reviewed and negative.    PHYSICAL EXAM: VS:  BP 110/64   Pulse 64   Ht 5\' 11"  (1.803 m)   Wt 140 lb 12.8 oz (63.9 kg)   SpO2 98%   BMI 19.64 kg/m  , BMI Body mass index is 19.64 kg/m. GEN: Well nourished, well developed, in no acute distress  HEENT: normal  Neck: no JVD, carotid bruits, or masses Cardiac: RRR; no murmurs, rubs, or gallops,no edema  Respiratory:  clear to auscultation bilaterally, normal work of breathing GI: soft, nontender, nondistended, + BS MS: no deformity or atrophy  Skin: warm and dry Neuro:  Strength and sensation are intact Psych: euthymic mood, full affect  EKG:  EKG is not ordered today. Personal review of the ekg ordered shows sinus rhythm, frequent PVCs  Recent Labs: 09/19/2015: BUN 10; Creatinine, Ser 1.14; Hemoglobin 12.4; Platelets 218; Potassium 4.3; Sodium 138    Lipid Panel     Component Value Date/Time   CHOL 171 10/02/2007 2025   TRIG 72 10/02/2007 2025   HDL 62 10/02/2007 2025   CHOLHDL 2.8 Ratio 10/02/2007 2025   VLDL 14 10/02/2007 2025   LDLCALC 95 10/02/2007 2025     Wt Readings from Last 3 Encounters:  07/14/16 140 lb 12.8 oz (63.9 kg)  07/07/16 140 lb (63.5 kg)  06/23/16 140 lb (63.5 kg)      Other studies Reviewed: Additional studies/ records that  were reviewed today include: TTE 07/12/16  Review of the above records today demonstrates:  - Left ventricle: The cavity size was normal. Systolic function was   normal. The estimated ejection fraction was in the range of 60%   to 65%. Wall motion was normal; there were no regional wall   motion abnormalities. Doppler parameters are consistent with   abnormal left ventricular relaxation (grade 1 diastolic   dysfunction). - Aortic valve: Trileaflet; mildly thickened, mildly calcified   leaflets. Transvalvular velocity was within the normal range.   There was no stenosis. There was moderate regurgitation. - Aortic root: The aortic root was mildly dilated measuring 40 mm. - Ascending aorta: The ascending aorta was mildly dilated measuring   40 mm. -  Mitral valve: Structurally normal valve. There was mild   regurgitation. - Right ventricle: Systolic function was normal. - Right atrium: The atrium was normal in size. - Tricuspid valve: There was mild regurgitation. - Pulmonic valve: There was trivial regurgitation. - Pulmonary arteries: Systolic pressure was moderately increased.   PA peak pressure: 38 mm Hg (S). - Inferior vena cava: The vessel was normal in size. - Pericardium, extracardiac: There was no pericardial effusion.  Spect 07/07/16  The left ventricular ejection fraction is mildly decreased (45-54%).  Nuclear stress EF: 52%. No significant wall motion abnormalities  T wave inversion was noted during stress in the II, III, aVF, V5 and V4 leads.  This is a low risk study. No significant perfusion defects, no ischemia.  ASSESSMENT AND PLAN:  1.  PVCs: Currently he is feeling well and not having PVCs in the office. His exam reveals a regular rhythm. Due to the fact that he is not having PVCs today, will plan to fit him with a 48 hour monitor to get a better determination of his PVC burden. If he does require therapy for his PVCs, which start with likely amiodarone to see if this  will suppress them.  2. Coronary artery disease: Recent low risk perfusion study no current chest pain.  3. Hypertension currently well controlled  4. Hyperlipidemia:On simvastatin   Current medicines are reviewed at length with the patient today.   The patient does not have concerns regarding his medicines.  The following changes were made today:  none  Labs/ tests ordered today include:  Orders Placed This Encounter  Procedures  . Holter monitor - 48 hour     Disposition:   FU with Will Camnitz 6 weeks  Signed, Will Meredith Leeds, MD  07/14/2016 11:33 AM     [address] [place] [id number] [phone] (office) [phone] (fax)

## 2016-07-14 NOTE — Patient Instructions (Addendum)
Medication Instructions:    Your physician recommends that you continue on your current medications as directed. Please refer to the Current Medication list given to you today.  --- If you need a refill on your cardiac medications before your next appointment, please call your pharmacy. ---  Labwork:  None ordered  Testing/Procedures: Your physician has recommended that you wear a 48 hour holter monitor. Holter monitors are medical devices that record the heart's electrical activity. Doctors most often use these monitors to diagnose arrhythmias. Arrhythmias are problems with the speed or rhythm of the heartbeat. The monitor is a small, portable device. You can wear one while you do your normal daily activities. This is usually used to diagnose what is causing palpitations/syncope (passing out).  Follow-Up:  Your physician recommends that you schedule a follow-up appointment in: 6 weeks with Dr. Curt Bears.  Thank you for choosing [facility]!!   Trinidad Curet, RN [phone]    Any Other Special Instructions Will Be Listed Below (If Applicable).  Holter Monitoring A Holter monitor is a small device that is used to detect abnormal heart rhythms. It clips to your clothing and is connected by wires to flat, sticky disks (electrodes) that attach to your chest. It is worn continuously for 24-48 hours. HOME CARE INSTRUCTIONS  Wear your Holter monitor at all times, even while exercising and sleeping, for as long as directed by your health care provider.  Make sure that the Holter monitor is safely clipped to your clothing or close to your body as recommended by your health care provider.  Do not get the monitor or wires wet.  Do not put body lotion or moisturizer on your chest.  Keep your skin clean.  Keep a diary of your daily activities, such as walking and doing chores. If you feel that your heartbeat is abnormal or that your heart is fluttering or skipping a beat:  Record  what you are doing when it happens.  Record what time of day the symptoms occur.  Return your Holter monitor as directed by your health care provider.  Keep all follow-up visits as directed by your health care provider. This is important. SEEK IMMEDIATE MEDICAL CARE IF:  You feel lightheaded or you faint.  You have trouble breathing.  You feel pain in your chest, upper arm, or jaw.  You feel sick to your stomach and your skin is pale, cool, or damp.  You heartbeat feels unusual or abnormal.   This information is not intended to replace advice given to you by your health care provider. Make sure you discuss any questions you have with your health care provider.   Document Released: 07/09/2004 Document Revised: 11/01/2014 Document Reviewed: 05/20/2014 Elsevier Interactive Patient Education [employer].

## 2016-07-16 ENCOUNTER — Encounter: Payer: Self-pay | Admitting: Internal Medicine

## 2016-07-16 ENCOUNTER — Ambulatory Visit (INDEPENDENT_AMBULATORY_CARE_PROVIDER_SITE_OTHER): Payer: Commercial Managed Care - HMO | Admitting: Internal Medicine

## 2016-07-16 VITALS — BP 110/64 | HR 70 | Ht 71.0 in | Wt 140.5 lb

## 2016-07-16 DIAGNOSIS — I2510 Atherosclerotic heart disease of native coronary artery without angina pectoris: Secondary | ICD-10-CM

## 2016-07-16 DIAGNOSIS — R001 Bradycardia, unspecified: Secondary | ICD-10-CM | POA: Diagnosis not present

## 2016-07-16 DIAGNOSIS — I2119 ST elevation (STEMI) myocardial infarction involving other coronary artery of inferior wall: Secondary | ICD-10-CM

## 2016-07-16 DIAGNOSIS — I493 Ventricular premature depolarization: Secondary | ICD-10-CM

## 2016-07-16 DIAGNOSIS — I2583 Coronary atherosclerosis due to lipid rich plaque: Secondary | ICD-10-CM

## 2016-07-16 NOTE — Progress Notes (Signed)
OFFICE NOTE  Chief Complaint:  Follow-up palpitations  Primary Care Physician: Irven Shelling, MD  HPI:  Robert [name] is a 82 year-old gentleman with a history of remote inferior MI in the 26s. Catheterization in 1987 showed an occluded marginal branch of the RCA. He was treated medically at that time, but in 2004, he had worsening coronary disease and intervention in the circumflex with a Taxus stent. Since them, he has shown nonreversible defects on his Myoviews with a perfusion defect in the inferoseptal walls consistent with a prior scar. EF is 51%. He was recently in the hospital with a small-bowel obstruction and a CT angiogram was performed, which showed no pulmonary emboli. He also has a history of aortic stent graft, which was placed in 2008. Recently, he had felt funny, but could not describe it much better. He was found to be hypertensive and Robert [name] increased his lisinopril to 30 mg daily and did not recommend any further treatment. Today he returns with persistently elevated blood pressure, 184/104. The blood pressure on a recheck was 150/80. Weight has been stable at around 162 to 163 pounds. At his last office visit I increased his lisinopril to 40 mg daily and his blood pressure now is much better controlled. He reports being asymptomatic and denies any chest pain worsening shortness of breath. His weight has been stable if not down a few pounds.  Robert [name] returns today for follow-up. He occasionally gets some shortness of breath which she relates to asthma, but does not have an inhaler. He denies any weight gain, in fact she's lost about 20 pounds. Is notable that his blood pressure is borderline low today at 102/68. He scheduled to see his primary care provider later this week for a full physical.  Robert [name] was seen in the office today. Overall he is doing fairly well but continues to lose weight. His weight is now down to 141 pounds with a BMI of 19. He says  he rarely eats breakfast and eats very lightly throughout the day. There is been no concern for malignancy or other reason for his weight loss from his primary care provider. Because of his weight loss, however we've been reducing his medications. At his last office visit I talked about lowering his lisinopril further and this may be necessary as his blood pressure today is below [id number] systolic. Heart rate actually is low as well in the 40s. Reviewing his EKG shows a trend toward bradycardia over the past several years. He reports some occasional dizziness and fatigue and had one episode of chest discomfort at rest which sounded somewhat atypical for angina.  Robert [name] returns today for follow-up of his monitor. He wore this for bradycardia. This demonstrated a sinus bradycardia with a heart rate as low as 39 mostly at night. He does not recall any symptoms related to this. He says some of his dizziness has improved. I asked to see if his eye doctor would stop his [place] eyedrops and he subsequently been switched to another type of eyedrop. Heart rate is somewhat higher today at 55, therefore this medication may have been affecting heart rate. He should certainly avoid any AV nodal blocking agents, but I do not see a clear indication for pacemaker at this time.  06/23/2016  Robert [name] is seen today back in follow-up. His wife reported that he's been having more frequent palpitations and made an appointment. EKG today shows sinus rhythm with frequent PVCs at 88. He's been  off of all beta blockers due to bradycardia with heart rate as low as the 30s in the past. Now these having breakthrough PVCs I'm concerned about either ischemic etiology, or related to prior scar with his MI in the past. He could also have a cardiomyopathy causing his symptoms. He does report some fatigue and chest discomfort as well. Options for management of his PVCs are limited given bradycardia in the past with beta blockers and he may  be only a candidate for either antiarrhythmic therapy or ablation if his PVCs are not ischemically mediated.  07/16/2016  Robert [name] seen today in follow-up. He now reports that he is asymptomatic with PVCs. Although he has some cardiomyopathy was thought that these could be related to PVCs he was not found to be having significant PVCs on EKG when he recently saw Dr. Curt Bears. He ordered a 48 hour Holter monitor which is yet to be placed. Follow-up with Dr. Curt Bears is in November.   PMHx:  Past Medical History:  Diagnosis Date  . Atrial fibrillation ([place])   . CAD (coronary artery disease)    DES to Cfx  . Diabetes mellitus without complication ([place])   . History of nuclear stress test 02/2012   lexiscan; mild-mod perfusion defect in basal inferoseptal, mid inferoseptal apical anterior; no change from previous study, abnormal test   . Hyperlipidemia   . Hypertension   . Myocardial infarction ([place]) 1980s  . PAD (peripheral artery disease) ([place])    aortic stent graft 2008  . PUD (peptic ulcer disease)   . Small bowel obstruction ([place]) 10/2011  . Ulcer     Past Surgical History:  Procedure Laterality Date  . CARDIAC CATHETERIZATION  1987   occluded marginal branch of RCA, treated medically - Dr Melvern Banker  . CORONARY ANGIOPLASTY WITH STENT PLACEMENT  03/2003   2.5x45mm Taxus DES to Cfx  . ESOPHAGOGASTRODUODENOSCOPY N/A 03/14/2014   Procedure: ESOPHAGOGASTRODUODENOSCOPY (EGD) with Balloon Dilation;  Surgeon: Garlan Fair, MD;  Location: [facility];  Service: Endoscopy;  Laterality: N/A;  . EYE SURGERY Right    cataract surgery  . GASTRECTOMY  50 yrs ago - 2 operations   had surgery at the age of 17 for the ulcer  . ILIAC ARTERY ANEURYSM REPAIR     Repaied by Dr. Kellie Simmering april 2009  . JOINT REPLACEMENT Left    hip replacement  . TRANSTHORACIC ECHOCARDIOGRAM  11/2009   EF=>55%, mild conc LVH, normal LV systolic function; mild MR; mild-mod TR with RV systolic pressure elevated at  30-29mmHg; mild-mod AV regurg; mild pulm valve regurg    FAMHx:  Family History  Problem Relation Age of Onset  . Stroke Father   . Heart disease Father   . Hypertension Father   . Heart disease Daughter     Before age 66  . Diabetes Sister     DVT-Blood clots in vein    SOCHx:   reports that he quit smoking about 24 years ago. His smoking use included Cigarettes. He quit after 0.50 years of use. He has never used smokeless tobacco. He reports that he does not drink alcohol or use drugs.  ALLERGIES:  Allergies  Allergen Reactions  . Asa W/Codeine [Aspirin-Codeine] Other (See Comments)    Irritates his water system    ROS: Pertinent items noted in HPI and remainder of comprehensive ROS otherwise negative.  HOME MEDS: Current Outpatient Prescriptions  Medication Sig Dispense Refill  . ALPHAGAN P 0.1 % SOLN Place 1 drop into  both eyes at bedtime.   0  . brimonidine-timolol (COMBIGAN) 0.2-0.5 % ophthalmic solution Place 1 drop into both eyes daily.    . clopidogrel (PLAVIX) 75 MG tablet Take 75 mg by mouth daily.      . finasteride (PROSCAR) 5 MG tablet Take 5 mg by mouth daily.      . furosemide (LASIX) 40 MG tablet Take 40 mg by mouth daily.   1  . latanoprost (XALATAN) 0.005 % ophthalmic solution Place 1 drop into the left eye at bedtime.     Marland Kitchen lisinopril (PRINIVIL,ZESTRIL) 20 MG tablet take 1 tablet by mouth once daily **PLEASE SCHEDULE APPOINTMENT FOR REFILLS** 90 tablet 0  . metFORMIN (GLUCOPHAGE) 500 MG tablet Take 500 mg by mouth 2 (two) times daily.  1  . simvastatin (ZOCOR) 40 MG tablet Take 40 mg by mouth daily.   0   No current facility-administered medications for this visit.     LABS/IMAGING: No results found for this or any previous visit (from the past 48 hour(s)). No results found.  VITALS: BP 110/64 (BP Location: Right Arm, Patient Position: Sitting, Cuff Size: Normal)   Pulse 70   Ht 5\' 11"  (1.803 m)   Wt 140 lb 8 oz (63.7 kg)   SpO2 98%   BMI  19.60 kg/m   EXAM: Deferred  EKG: Deferred  ASSESSMENT: 1. Frequent symptomatic PVCs 2. Chest discomfort and fatigue 3. Coronary disease with a history of remote inferior MI 4. History PCI to circumflex and known occluded right coronary artery 5. Dyslipidemia - followed by Dr. Laurann Montana 6. Hypertension - at goal 7. History of stent graft for AAA 6.   Sinus bradycardia - asymptomatic, improved off b-blockers  PLAN: 1.   Robert [name] now reports it is asymptomatic with his PVCs however in the past he said he was bothered by them. He is not having many at this time apparently. He scheduled for 48 hour monitor to see if they are recurring with any frequency that would be amenable to either antiarrhythmic therapy or ablation. Follow-up with Dr. Curt Bears in November and me in 6 months.  Pixie Casino, MD, [facility] Attending Cardiologist [place] C Blessed Cotham 07/16/2016, 10:00 AM

## 2016-07-16 NOTE — Patient Instructions (Addendum)
Medication Instructions:  Your physician recommends that you continue on your current medications as directed. Please refer to the Current Medication list given to you today.  Labwork: NONE ORDERED  Testing/Procedures: NONE ORDERED  Follow-Up: Your physician wants you to follow-up in: [place]. You will receive a reminder letter in the mail two months in advance. If you don't receive a letter, please call our office to schedule the follow-up appointment.  Any Other Special Instructions Will Be Listed Below (If Applicable).    If you need a refill on your cardiac medications before your next appointment, please call your pharmacy.

## 2016-07-21 ENCOUNTER — Ambulatory Visit (INDEPENDENT_AMBULATORY_CARE_PROVIDER_SITE_OTHER): Payer: Commercial Managed Care - HMO

## 2016-07-21 DIAGNOSIS — I493 Ventricular premature depolarization: Secondary | ICD-10-CM | POA: Diagnosis not present

## 2016-08-11 DIAGNOSIS — H26492 Other secondary cataract, left eye: Secondary | ICD-10-CM | POA: Diagnosis not present

## 2016-08-11 DIAGNOSIS — H25811 Combined forms of age-related cataract, right eye: Secondary | ICD-10-CM | POA: Diagnosis not present

## 2016-08-11 DIAGNOSIS — H401131 Primary open-angle glaucoma, bilateral, mild stage: Secondary | ICD-10-CM | POA: Diagnosis not present

## 2016-08-26 ENCOUNTER — Other Ambulatory Visit: Payer: Self-pay | Admitting: Internal Medicine

## 2016-08-30 ENCOUNTER — Encounter: Payer: Self-pay | Admitting: *Deleted

## 2016-09-01 NOTE — Progress Notes (Deleted)
Electrophysiology Office Note   Date:  09/01/2016   ID:  Robert [name], DOB [birth year], MRN [id number] a PCP:  Irven Shelling, MD  Cardiologist:  Debara Pickett Primary Electrophysiologist:  Ocean Kearley Meredith Leeds, MD    No chief complaint on file.    History of Present Illness: Robert [name] is a 82 y.o. male who presents today for electrophysiology evaluation.   He has a history of remote inferior MI in the 3s. In 2004 he had a circumflex stent placed. He had an aortic stent graft in 2008. He also has atrial fibrillation, diabetes, hypertension, hyperlipidemia. He was seen by Dr. Debara Pickett in August for palpitations. At that time, he was in sinus rhythm with frequent PVCs. He has a history of bradycardia and therefore does not take beta blockers. It was thought that his PVCs could be ischemic in nature, but stress testing was low risk.   Today, he denies symptoms of palpitations, chest pain, shortness of breath, orthopnea, PND, lower extremity edema, claudication, dizziness, presyncope, syncope, bleeding, or neurologic sequela. The patient is tolerating medications without difficulties and is otherwise without complaint today. He says that he gets palpitations maybe once a week. There are no exacerbating or alleviating factors. He has not noted any chest pain, shortness of breath, PND, orthopnea associated with his palpitations. He says that they last up to a few hours when he has them.   Past Medical History:  Diagnosis Date  . Atrial fibrillation ([place])   . CAD (coronary artery disease)    DES to Cfx  . Diabetes mellitus without complication ([place])   . History of nuclear stress test 02/2012   lexiscan; mild-mod perfusion defect in basal inferoseptal, mid inferoseptal apical anterior; no change from previous study, abnormal test   . Hyperlipidemia   . Hypertension   . Myocardial infarction 1980s  . PAD (peripheral artery disease) ([place])    aortic stent graft 2008  . PUD (peptic ulcer  disease)   . Small bowel obstruction 10/2011  . Ulcer [facility])    Past Surgical History:  Procedure Laterality Date  . CARDIAC CATHETERIZATION  1987   occluded marginal branch of RCA, treated medically - Dr Melvern Banker  . CORONARY ANGIOPLASTY WITH STENT PLACEMENT  03/2003   2.5x21mm Taxus DES to Cfx  . ESOPHAGOGASTRODUODENOSCOPY N/A 03/14/2014   Procedure: ESOPHAGOGASTRODUODENOSCOPY (EGD) with Balloon Dilation;  Surgeon: Garlan Fair, MD;  Location: [facility];  Service: Endoscopy;  Laterality: N/A;  . EYE SURGERY Right    cataract surgery  . GASTRECTOMY  50 yrs ago - 2 operations   had surgery at the age of 4 for the ulcer  . ILIAC ARTERY ANEURYSM REPAIR     Repaied by Dr. Kellie Simmering april 2009  . JOINT REPLACEMENT Left    hip replacement  . TRANSTHORACIC ECHOCARDIOGRAM  11/2009   EF=>55%, mild conc LVH, normal LV systolic function; mild MR; mild-mod TR with RV systolic pressure elevated at 30-61mmHg; mild-mod AV regurg; mild pulm valve regurg     Current Outpatient Prescriptions  Medication Sig Dispense Refill  . ALPHAGAN P 0.1 % SOLN Place 1 drop into both eyes at bedtime.   0  . brimonidine-timolol (COMBIGAN) 0.2-0.5 % ophthalmic solution Place 1 drop into both eyes daily.    . clopidogrel (PLAVIX) 75 MG tablet Take 75 mg by mouth daily.      . finasteride (PROSCAR) 5 MG tablet Take 5 mg by mouth daily.      . furosemide (LASIX) 40 MG tablet Take  40 mg by mouth daily.   1  . latanoprost (XALATAN) 0.005 % ophthalmic solution Place 1 drop into the left eye at bedtime.     Marland Kitchen lisinopril (PRINIVIL,ZESTRIL) 20 MG tablet take 1 tablet by mouth once daily ***PLEASE SCHEDULE APPOINTMENT FOR REFILLS** 90 tablet 2  . metFORMIN (GLUCOPHAGE) 500 MG tablet Take 500 mg by mouth 2 (two) times daily.  1  . simvastatin (ZOCOR) 40 MG tablet Take 40 mg by mouth daily.   0   No current facility-administered medications for this visit.     Allergies:   Asa w/codeine [aspirin-codeine]   Social History:   The patient  reports that he quit smoking about 24 years ago. His smoking use included Cigarettes. He quit after 0.50 years of use. He has never used smokeless tobacco. He reports that he does not drink alcohol or use drugs.   Family History:  The patient's family history includes Diabetes in his sister; Heart disease in his daughter and father; Hypertension in his father; Stroke in his father.    ROS:  Please see the history of present illness.   Otherwise, review of systems is positive for ***.   All other systems are reviewed and negative.    PHYSICAL EXAM: VS:  There were no vitals taken for this visit. , BMI There is no height or weight on file to calculate BMI. GEN: Well nourished, well developed, in no acute distress  HEENT: normal  Neck: no JVD, carotid bruits, or masses Cardiac: ***RRR; no murmurs, rubs, or gallops,no edema  Respiratory:  clear to auscultation bilaterally, normal work of breathing GI: soft, nontender, nondistended, + BS MS: no deformity or atrophy  Skin: warm and dry Neuro:  Strength and sensation are intact Psych: euthymic mood, full affect  EKG:  EKG is not ordered today. Personal review of the ekg ordered shows sinus rhythm, frequent PVCs***  Recent Labs: 09/19/2015: BUN 10; Creatinine, Ser 1.14; Hemoglobin 12.4; Platelets 218; Potassium 4.3; Sodium 138    Lipid Panel     Component Value Date/Time   CHOL 171 10/02/2007 2025   TRIG 72 10/02/2007 2025   HDL 62 10/02/2007 2025   CHOLHDL 2.8 Ratio 10/02/2007 2025   VLDL 14 10/02/2007 2025   LDLCALC 95 10/02/2007 2025     Wt Readings from Last 3 Encounters:  07/16/16 140 lb 8 oz (63.7 kg)  07/14/16 140 lb 12.8 oz (63.9 kg)  07/07/16 140 lb (63.5 kg)      Other studies Reviewed: Additional studies/ records that were reviewed today include: TTE 07/12/16  Review of the above records today demonstrates:  - Left ventricle: The cavity size was normal. Systolic function was   normal. The estimated  ejection fraction was in the range of 60%   to 65%. Wall motion was normal; there were no regional wall   motion abnormalities. Doppler parameters are consistent with   abnormal left ventricular relaxation (grade 1 diastolic   dysfunction). - Aortic valve: Trileaflet; mildly thickened, mildly calcified   leaflets. Transvalvular velocity was within the normal range.   There was no stenosis. There was moderate regurgitation. - Aortic root: The aortic root was mildly dilated measuring 40 mm. - Ascending aorta: The ascending aorta was mildly dilated measuring   40 mm. - Mitral valve: Structurally normal valve. There was mild   regurgitation. - Right ventricle: Systolic function was normal. - Right atrium: The atrium was normal in size. - Tricuspid valve: There was mild regurgitation. - Pulmonic valve:  There was trivial regurgitation. - Pulmonary arteries: Systolic pressure was moderately increased.   PA peak pressure: 38 mm Hg (S). - Inferior vena cava: The vessel was normal in size. - Pericardium, extracardiac: There was no pericardial effusion.  Spect 07/07/16  The left ventricular ejection fraction is mildly decreased (45-54%).  Nuclear stress EF: 52%. No significant wall motion abnormalities  T wave inversion was noted during stress in the II, III, aVF, V5 and V4 leads.  This is a low risk study. No significant perfusion defects, no ischemia. Holter 07/21/16 Average HR: 73 Maximum HR: 142 Minimum HR: 43 Sinus rhythm with sinus tachycardia Nonsustained atrial runs No evidence of atrial fibrillation  ASSESSMENT AND PLAN:  1.  PVCs: Currently he is feeling well and not having PVCs in the office. His exam reveals a regular rhythm. Due to the fact that he is not having PVCs today, Artavius Stearns plan to fit him with a 48 hour monitor to get a better determination of his PVC burden. If he does require therapy for his PVCs, which start with likely amiodarone to see if this Janesha Brissette suppress  them.  2. Coronary artery disease: Recent low risk perfusion study no current chest pain.  3. Hypertension currently well controlled  4. Hyperlipidemia:On simvastatin   Current medicines are reviewed at length with the patient today.   The patient does not have concerns regarding his medicines.  The following changes were made today:  none  Labs/ tests ordered today include:  No orders of the defined types were placed in this encounter.    Disposition:   FU with Rielynn Trulson 6 weeks  Signed, Noha Milberger Meredith Leeds, MD  09/01/2016 8:49 PM     [address] [place] [address] [phone] (office) [phone] (fax)

## 2016-09-02 ENCOUNTER — Ambulatory Visit: Payer: Commercial Managed Care - HMO | Admitting: Cardiology

## 2016-09-03 ENCOUNTER — Encounter: Payer: Self-pay | Admitting: Cardiology

## 2016-09-14 ENCOUNTER — Other Ambulatory Visit: Payer: Self-pay | Admitting: *Deleted

## 2016-09-14 MED ORDER — LISINOPRIL 20 MG PO TABS: ORAL_TABLET | ORAL | 2 refills | Status: DC

## 2016-09-23 NOTE — Addendum Note (Signed)
Addended by: Lianne Cure A on: 09/23/2016 12:45 PM   Modules accepted: Orders

## 2016-09-24 DIAGNOSIS — E1151 Type 2 diabetes mellitus with diabetic peripheral angiopathy without gangrene: Secondary | ICD-10-CM | POA: Diagnosis not present

## 2016-09-24 DIAGNOSIS — I739 Peripheral vascular disease, unspecified: Secondary | ICD-10-CM | POA: Diagnosis not present

## 2016-09-24 DIAGNOSIS — I10 Essential (primary) hypertension: Secondary | ICD-10-CM | POA: Diagnosis not present

## 2016-09-24 DIAGNOSIS — M79605 Pain in left leg: Secondary | ICD-10-CM | POA: Diagnosis not present

## 2016-12-31 DIAGNOSIS — L299 Pruritus, unspecified: Secondary | ICD-10-CM | POA: Diagnosis not present

## 2017-01-13 ENCOUNTER — Ambulatory Visit: Payer: Medicare HMO | Admitting: Internal Medicine

## 2017-01-13 DIAGNOSIS — L298 Other pruritus: Secondary | ICD-10-CM | POA: Diagnosis not present

## 2017-01-14 ENCOUNTER — Encounter: Payer: Self-pay | Admitting: *Deleted

## 2017-01-21 ENCOUNTER — Encounter: Payer: Self-pay | Admitting: Internal Medicine

## 2017-01-21 ENCOUNTER — Ambulatory Visit (INDEPENDENT_AMBULATORY_CARE_PROVIDER_SITE_OTHER): Payer: Medicare HMO | Admitting: Internal Medicine

## 2017-01-21 VITALS — BP 126/66 | HR 58 | Ht 71.0 in | Wt 149.0 lb

## 2017-01-21 DIAGNOSIS — I493 Ventricular premature depolarization: Secondary | ICD-10-CM | POA: Diagnosis not present

## 2017-01-21 DIAGNOSIS — I2583 Coronary atherosclerosis due to lipid rich plaque: Secondary | ICD-10-CM | POA: Diagnosis not present

## 2017-01-21 DIAGNOSIS — R5383 Other fatigue: Secondary | ICD-10-CM

## 2017-01-21 DIAGNOSIS — I2510 Atherosclerotic heart disease of native coronary artery without angina pectoris: Secondary | ICD-10-CM

## 2017-01-21 DIAGNOSIS — I10 Essential (primary) hypertension: Secondary | ICD-10-CM | POA: Diagnosis not present

## 2017-01-21 DIAGNOSIS — I2119 ST elevation (STEMI) myocardial infarction involving other coronary artery of inferior wall: Secondary | ICD-10-CM | POA: Diagnosis not present

## 2017-01-21 NOTE — Patient Instructions (Signed)
Your physician recommends that you continue on your current medications as directed. Please refer to the Current Medication list given to you today.  Your physician wants you to follow-up in: 12 months with Dr. Debara Pickett. You will receive a reminder letter in the mail two months in advance. If you don't receive a letter, please call our office to schedule the follow-up appointment.

## 2017-01-24 NOTE — Progress Notes (Signed)
OFFICE NOTE  Chief Complaint:  Fatigue, occasional palpitations  Primary Care Physician: Irven Shelling, MD  HPI:  Robert [name] is a 82 year-old gentleman with a history of remote inferior MI in the 34s. Catheterization in 1987 showed an occluded marginal branch of the RCA. He was treated medically at that time, but in 2004, he had worsening coronary disease and intervention in the circumflex with a Taxus stent. Since them, he has shown nonreversible defects on his Myoviews with a perfusion defect in the inferoseptal walls consistent with a prior scar. EF is 51%. He was recently in the hospital with a small-bowel obstruction and a CT angiogram was performed, which showed no pulmonary emboli. He also has a history of aortic stent graft, which was placed in 2008. Recently, he had felt funny, but could not describe it much better. He was found to be hypertensive and Robert [name] increased his lisinopril to 30 mg daily and did not recommend any further treatment. Today he returns with persistently elevated blood pressure, 184/104. The blood pressure on a recheck was 150/80. Weight has been stable at around 162 to 163 pounds. At his last office visit I increased his lisinopril to 40 mg daily and his blood pressure now is much better controlled. He reports being asymptomatic and denies any chest pain worsening shortness of breath. His weight has been stable if not down a few pounds.  Robert [name] returns today for follow-up. He occasionally gets some shortness of breath which she relates to asthma, but does not have an inhaler. He denies any weight gain, in fact she's lost about 20 pounds. Is notable that his blood pressure is borderline low today at 102/68. He scheduled to see his primary care provider later this week for a full physical.  Robert [name] was seen in the office today. Overall he is doing fairly well but continues to lose weight. His weight is now down to 141 pounds with a BMI of 19.  He says he rarely eats breakfast and eats very lightly throughout the day. There is been no concern for malignancy or other reason for his weight loss from his primary care provider. Because of his weight loss, however we've been reducing his medications. At his last office visit I talked about lowering his lisinopril further and this may be necessary as his blood pressure today is below 784 systolic. Heart rate actually is low as well in the 40s. Reviewing his EKG shows a trend toward bradycardia over the past several years. He reports some occasional dizziness and fatigue and had one episode of chest discomfort at rest which sounded somewhat atypical for angina.  Robert [name] returns today for follow-up of his monitor. He wore this for bradycardia. This demonstrated a sinus bradycardia with a heart rate as low as 39 mostly at night. He does not recall any symptoms related to this. He says some of his dizziness has improved. I asked to see if his eye doctor would stop his [place] eyedrops and he subsequently been switched to another type of eyedrop. Heart rate is somewhat higher today at 55, therefore this medication may have been affecting heart rate. He should certainly avoid any AV nodal blocking agents, but I do not see a clear indication for pacemaker at this time.  06/23/2016  Robert [name] is seen today back in follow-up. His wife reported that he's been having more frequent palpitations and made an appointment. EKG today shows sinus rhythm with frequent PVCs at 88. He's  been off of all beta blockers due to bradycardia with heart rate as low as the 30s in the past. Now these having breakthrough PVCs I'm concerned about either ischemic etiology, or related to prior scar with his MI in the past. He could also have a cardiomyopathy causing his symptoms. He does report some fatigue and chest discomfort as well. Options for management of his PVCs are limited given bradycardia in the past with beta blockers and  he may be only a candidate for either antiarrhythmic therapy or ablation if his PVCs are not ischemically mediated.  07/16/2016  Robert [name] seen today in follow-up. He now reports that he is asymptomatic with PVCs. Although he has some cardiomyopathy was thought that these could be related to PVCs he was not found to be having significant PVCs on EKG when he recently saw Dr. Curt Bears. He ordered a 48 hour Holter monitor which is yet to be placed. Follow-up with Dr. Curt Bears is in November.   01/21/2017  Robert [name] is reporting some fatigue and occasional palpitations. He's been seeing  Dr. Curt Bears who did monitoring and felt that he had an insignificant number of PVCs. Based on this he has not been started on antiarrhythmic therapy. At times though he continues to have some palpitations which he feels fatigued with. Blood pressure is normal 126/66 today. He is on lisinopril but not on any AV nodal blocking medications. Heart rate at rest is 58.  PMHx:  Past Medical History:  Diagnosis Date  . Atrial fibrillation ([place])   . CAD (coronary artery disease)    DES to Cfx  . Diabetes mellitus without complication ([place])   . History of nuclear stress test 02/2012   lexiscan; mild-mod perfusion defect in basal inferoseptal, mid inferoseptal apical anterior; no change from previous study, abnormal test   . Hyperlipidemia   . Hypertension   . Myocardial infarction 1980s  . PAD (peripheral artery disease) ([place])    aortic stent graft 2008  . PUD (peptic ulcer disease)   . Small bowel obstruction 10/2011  . Ulcer [facility])     Past Surgical History:  Procedure Laterality Date  . CARDIAC CATHETERIZATION  1987   occluded marginal branch of RCA, treated medically - Dr Melvern Banker  . CORONARY ANGIOPLASTY WITH STENT PLACEMENT  03/2003   2.5x3mm Taxus DES to Cfx  . ESOPHAGOGASTRODUODENOSCOPY N/A 03/14/2014   Procedure: ESOPHAGOGASTRODUODENOSCOPY (EGD) with Balloon Dilation;  Surgeon: Garlan Fair, MD;   Location: [facility] ENDOSCOPY;  Service: Endoscopy;  Laterality: N/A;  . EYE SURGERY Right    cataract surgery  . GASTRECTOMY  50 yrs ago - 2 operations   had surgery at the age of 55 for the ulcer  . ILIAC ARTERY ANEURYSM REPAIR     Repaied by Dr. Kellie Simmering april 2009  . JOINT REPLACEMENT Left    hip replacement  . TRANSTHORACIC ECHOCARDIOGRAM  11/2009   EF=>55%, mild conc LVH, normal LV systolic function; mild MR; mild-mod TR with RV systolic pressure elevated at 30-61mmHg; mild-mod AV regurg; mild pulm valve regurg    FAMHx:  Family History  Problem Relation Age of Onset  . Stroke Father   . Heart disease Father   . Hypertension Father   . Heart disease Daughter     Before age 69  . Diabetes Sister     DVT-Blood clots in vein    SOCHx:   reports that he quit smoking about 24 years ago. His smoking use included Cigarettes. He quit after 0.50  years of use. He has never used smokeless tobacco. He reports that he does not drink alcohol or use drugs.  ALLERGIES:  Allergies  Allergen Reactions  . Asa W/Codeine [Aspirin-Codeine] Other (See Comments)    Irritates his water system    ROS: Pertinent items noted in HPI and remainder of comprehensive ROS otherwise negative.  HOME MEDS: Current Outpatient Prescriptions  Medication Sig Dispense Refill  . ALPHAGAN P 0.1 % SOLN Place 1 drop into both eyes at bedtime.   0  . brimonidine-timolol (COMBIGAN) 0.2-0.5 % ophthalmic solution Place 1 drop into both eyes daily.    . clopidogrel (PLAVIX) 75 MG tablet Take 75 mg by mouth daily.      . finasteride (PROSCAR) 5 MG tablet Take 5 mg by mouth daily.      . furosemide (LASIX) 40 MG tablet Take 40 mg by mouth daily.   1  . latanoprost (XALATAN) 0.005 % ophthalmic solution Place 1 drop into the left eye at bedtime.     Marland Kitchen lisinopril (PRINIVIL,ZESTRIL) 20 MG tablet take 1 tablet by mouth once daily 90 tablet 2  . metFORMIN (GLUCOPHAGE) 500 MG tablet Take 500 mg by mouth 2 (two) times daily.  1  .  simvastatin (ZOCOR) 40 MG tablet Take 40 mg by mouth daily.   0   No current facility-administered medications for this visit.     LABS/IMAGING: No results found for this or any previous visit (from the past 48 hour(s)). No results found.  VITALS: BP 126/66   Pulse (!) 58   Ht 5\' 11"  (1.803 m)   Wt 149 lb (67.6 kg)   BMI 20.78 kg/m   EXAM: General appearance: alert and no distress Lungs: clear to auscultation bilaterally Heart: regular rate and rhythm Extremities: extremities normal, atraumatic, no cyanosis or edema Skin: Skin color, texture, turgor normal. No rashes or lesions Neurologic: Grossly normal  EKG: Sinus bradycardia 58, LVH with  ASSESSMENT: 1. Symptomatic PVCs 2. Fatigue 3. Coronary disease with a history of remote inferior MI 4. History PCI to circumflex and known occluded right coronary artery 5. Dyslipidemia - followed by Dr. Laurann Montana 6. Hypertension - at goal 7. History of stent graft for AAA 6.   Sinus bradycardia - asymptomatic, improved off b-blockers  PLAN: 1.   Robert [name] continues to have fatigue on and off and probably symptomatic PVCs. Was not felt that there were enough in frequency to treat with antiarrhythmic therapy and treatment may be difficult given his baseline bradycardia. Is not currently on any AV nodal blocking medicines. I advised him if his symptoms worsen that we should schedule him back to see Dr. Curt Bears for consideration of antiarrhythmic therapy.  Pixie Casino, MD, [facility] Attending Cardiologist [place] 01/24/2017, 1:37 PM

## 2017-01-27 DIAGNOSIS — Z96642 Presence of left artificial hip joint: Secondary | ICD-10-CM | POA: Diagnosis not present

## 2017-01-27 DIAGNOSIS — Z471 Aftercare following joint replacement surgery: Secondary | ICD-10-CM | POA: Diagnosis not present

## 2017-01-31 ENCOUNTER — Other Ambulatory Visit (HOSPITAL_COMMUNITY): Payer: Self-pay | Admitting: Orthopedic Surgery

## 2017-01-31 DIAGNOSIS — M25552 Pain in left hip: Secondary | ICD-10-CM

## 2017-02-04 ENCOUNTER — Encounter (HOSPITAL_COMMUNITY)
Admission: RE | Admit: 2017-02-04 | Discharge: 2017-02-04 | Disposition: A | Discharge status: Home / Self Care | Payer: Medicare HMO | Source: Ambulatory Visit | Attending: Orthopedic Surgery | Admitting: Orthopedic Surgery

## 2017-02-04 DIAGNOSIS — Z96642 Presence of left artificial hip joint: Secondary | ICD-10-CM | POA: Diagnosis not present

## 2017-02-04 DIAGNOSIS — M25552 Pain in left hip: Secondary | ICD-10-CM | POA: Insufficient documentation

## 2017-02-04 MED ORDER — TECHNETIUM TC 99M MEDRONATE IV KIT
25.0000 | PACK | Freq: Once | INTRAVENOUS | Status: AC | PRN
  Administered 2017-02-04: 25 via INTRAVENOUS

## 2017-02-14 DIAGNOSIS — M5136 Other intervertebral disc degeneration, lumbar region: Secondary | ICD-10-CM | POA: Diagnosis not present

## 2017-02-14 DIAGNOSIS — M5416 Radiculopathy, lumbar region: Secondary | ICD-10-CM | POA: Diagnosis not present

## 2017-02-17 DIAGNOSIS — M79605 Pain in left leg: Secondary | ICD-10-CM | POA: Diagnosis not present

## 2017-02-17 DIAGNOSIS — E1165 Type 2 diabetes mellitus with hyperglycemia: Secondary | ICD-10-CM | POA: Diagnosis not present

## 2017-02-17 DIAGNOSIS — I10 Essential (primary) hypertension: Secondary | ICD-10-CM | POA: Diagnosis not present

## 2017-02-17 DIAGNOSIS — E119 Type 2 diabetes mellitus without complications: Secondary | ICD-10-CM | POA: Diagnosis not present

## 2017-02-17 DIAGNOSIS — Z7984 Long term (current) use of oral hypoglycemic drugs: Secondary | ICD-10-CM | POA: Diagnosis not present

## 2017-02-22 DIAGNOSIS — M5416 Radiculopathy, lumbar region: Secondary | ICD-10-CM | POA: Diagnosis not present

## 2017-03-03 DIAGNOSIS — M5416 Radiculopathy, lumbar region: Secondary | ICD-10-CM | POA: Diagnosis not present

## 2017-03-03 DIAGNOSIS — M5136 Other intervertebral disc degeneration, lumbar region: Secondary | ICD-10-CM | POA: Diagnosis not present

## 2017-03-29 DIAGNOSIS — M5416 Radiculopathy, lumbar region: Secondary | ICD-10-CM | POA: Diagnosis not present

## 2017-04-11 ENCOUNTER — Encounter (HOSPITAL_COMMUNITY): Payer: Self-pay

## 2017-04-11 ENCOUNTER — Emergency Department (HOSPITAL_COMMUNITY): Payer: Medicare HMO

## 2017-04-11 ENCOUNTER — Emergency Department (HOSPITAL_COMMUNITY)
Admission: EM | Admit: 2017-04-11 | Discharge: 2017-04-11 | Disposition: A | Discharge status: Home / Self Care | Payer: Medicare HMO | Attending: Emergency Medicine | Admitting: Emergency Medicine

## 2017-04-11 DIAGNOSIS — M25552 Pain in left hip: Secondary | ICD-10-CM | POA: Insufficient documentation

## 2017-04-11 DIAGNOSIS — Z7984 Long term (current) use of oral hypoglycemic drugs: Secondary | ICD-10-CM | POA: Diagnosis not present

## 2017-04-11 DIAGNOSIS — I252 Old myocardial infarction: Secondary | ICD-10-CM | POA: Insufficient documentation

## 2017-04-11 DIAGNOSIS — Z955 Presence of coronary angioplasty implant and graft: Secondary | ICD-10-CM | POA: Insufficient documentation

## 2017-04-11 DIAGNOSIS — Z96642 Presence of left artificial hip joint: Secondary | ICD-10-CM | POA: Insufficient documentation

## 2017-04-11 DIAGNOSIS — I2510 Atherosclerotic heart disease of native coronary artery without angina pectoris: Secondary | ICD-10-CM | POA: Diagnosis not present

## 2017-04-11 DIAGNOSIS — E119 Type 2 diabetes mellitus without complications: Secondary | ICD-10-CM | POA: Insufficient documentation

## 2017-04-11 DIAGNOSIS — Z87891 Personal history of nicotine dependence: Secondary | ICD-10-CM | POA: Diagnosis not present

## 2017-04-11 DIAGNOSIS — Z471 Aftercare following joint replacement surgery: Secondary | ICD-10-CM | POA: Diagnosis not present

## 2017-04-11 MED ORDER — HYDROCODONE-ACETAMINOPHEN 5-325 MG PO TABS: 1.0000 | ORAL_TABLET | ORAL | 0 refills | Status: DC | PRN

## 2017-04-11 MED ORDER — HYDROCODONE-ACETAMINOPHEN 5-325 MG PO TABS
2.0000 | ORAL_TABLET | Freq: Once | ORAL | Status: AC
  Administered 2017-04-11: 2 via ORAL
  Filled 2017-04-11: qty 2

## 2017-04-11 NOTE — ED Provider Notes (Signed)
[facility] DEPT Provider Note   CSN: [id number] Arrival date & time: 04/11/17  1410  By signing my name below, I, Robert [name], attest that this documentation has been prepared under the direction and in the presence of Malvin Johns, MD. Electronically Signed: Jeanell [name], Scribe. 04/11/2017. 6:36 PM.  History   Chief Complaint Chief Complaint  Patient presents with  . Hip Pain   The history is provided by the patient. No language interpreter was used.   HPI Comments: Robert [name] is a 83 y.o. male who presents to the Emergency Department complaining of constant moderate left hip pain that started several months ago. His pain has been present since his hip surgery and has been worsening in the past few months. He had 2 hip injections on 03/29/17 from his orthopedist without relief.  He has been taking Tramadol with minimal relief. He also has gout in his left foot. Denies any numbness or other complaints at this time.  Past Medical History:  Diagnosis Date  . Atrial fibrillation ([place])   . CAD (coronary artery disease)    DES to Cfx  . Diabetes mellitus without complication ([place])   . History of nuclear stress test 02/2012   lexiscan; mild-mod perfusion defect in basal inferoseptal, mid inferoseptal apical anterior; no change from previous study, abnormal test   . Hyperlipidemia   . Hypertension   . Myocardial infarction ([place]) 1980s  . PAD (peripheral artery disease) ([place])    aortic stent graft 2008  . PUD (peptic ulcer disease)   . Small bowel obstruction ([place]) 10/2011  . Ulcer     Patient Active Problem List   Diagnosis Date Noted  . Other fatigue 06/23/2016  . Frequent PVCs 06/23/2016  . Symptomatic bradycardia 05/20/2015  . Pain of left lower extremity-Left Hip/Leg 06/04/2014  . Numbness-Bilat Leg 06/04/2014  . Weakness of both legs 06/04/2014  . Aftercare following surgery of the circulatory system, [place] 10/02/2013  . AAA (abdominal aortic aneurysm) ([place])  07/18/2012  . 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 Renal mass, left 11/07/2011  . SBO (small bowel obstruction) ([place]) 11/07/2011  . Hyperkalemia 11/07/2011  . CAD (coronary artery disease)   . Inferior MI ([place])   . Hyperlipidemia   . Hypertension   . Ulcer-s/p Gastrectomy 40 yr ago     Past Surgical History:  Procedure Laterality Date  . CARDIAC CATHETERIZATION  1987   occluded marginal branch of RCA, treated medically - Dr Melvern Banker  . CORONARY ANGIOPLASTY WITH STENT PLACEMENT  03/2003   2.5x65mm Taxus DES to Cfx  . ESOPHAGOGASTRODUODENOSCOPY N/A 03/14/2014   Procedure: ESOPHAGOGASTRODUODENOSCOPY (EGD) with Balloon Dilation;  Surgeon: Garlan Fair, MD;  Location: [facility];  Service: Endoscopy;  Laterality: N/A;  . EYE SURGERY Right    cataract surgery  . GASTRECTOMY  50 yrs ago - 2 operations   had surgery at the age of 89 for the ulcer  . ILIAC ARTERY ANEURYSM REPAIR     Repaied by Dr. Kellie Simmering april 2009  . JOINT REPLACEMENT Left    hip replacement  . TRANSTHORACIC ECHOCARDIOGRAM  11/2009   EF=>55%, mild conc LVH, normal LV systolic function; mild MR; mild-mod TR with RV systolic pressure elevated at 30-15mmHg; mild-mod AV regurg; mild pulm valve regurg       Home Medications    Prior to Admission medications   Medication Sig Start Date End Date Taking? Authorizing Provider  ALPHAGAN P 0.1 % SOLN Place 1 drop into both eyes at  bedtime.  09/16/15   [provider]  brimonidine-timolol (COMBIGAN) 0.2-0.5 % ophthalmic solution Place 1 drop into both eyes daily.    [provider]  clopidogrel (PLAVIX) 75 MG tablet Take 75 mg by mouth daily.      [provider]  finasteride (PROSCAR) 5 MG tablet Take 5 mg by mouth daily.      [provider]  furosemide (LASIX) 40 MG tablet Take 40 mg by mouth daily.  08/29/14   [provider]  HYDROcodone-acetaminophen (NORCO/VICODIN) 5-325 MG tablet Take 1-2 tablets by mouth every 4  (four) hours as needed. 04/11/17   Malvin Johns, MD  latanoprost (XALATAN) 0.005 % ophthalmic solution Place 1 drop into the left eye at bedtime.     [provider]  lisinopril (PRINIVIL,ZESTRIL) 20 MG tablet take 1 tablet by mouth once daily 09/14/16   Hilty, Nadean Corwin, MD  metFORMIN (GLUCOPHAGE) 500 MG tablet Take 500 mg by mouth 2 (two) times daily. 09/04/15   [provider]  simvastatin (ZOCOR) 40 MG tablet Take 40 mg by mouth daily.  08/29/14   [provider]    Family History Family History  Problem Relation Age of Onset  . Stroke Father   . Heart disease Father   . Hypertension Father   . Heart disease Daughter        Before age 26  . Diabetes Sister        DVT-Blood clots in vein    Social History Social History  Substance Use Topics  . Smoking status: Former Smoker    Years: 0.50    Types: Cigarettes    Quit date: 07/18/1992  . Smokeless tobacco: Never Used     Comment: quit around HA time  . Alcohol use No     Allergies   Asa w/codeine [aspirin-codeine]   Review of Systems Review of Systems  Constitutional: Negative for chills, diaphoresis, fatigue and fever.  HENT: Negative for congestion, rhinorrhea and sneezing.   Eyes: Negative.   Respiratory: Negative for cough, chest tightness and shortness of breath.   Cardiovascular: Negative for chest pain and leg swelling.  Gastrointestinal: Negative for abdominal pain, blood in stool, diarrhea, nausea and vomiting.  Genitourinary: Negative for difficulty urinating, flank pain, frequency and hematuria.  Musculoskeletal: Positive for arthralgias and myalgias (hip). Negative for back pain.  Skin: Negative for rash.  Neurological: Negative for dizziness, speech difficulty, weakness, numbness and headaches.     Physical Exam Updated Vital Signs BP (!) 155/83   Pulse 69   Temp 98.7 F (37.1 C) (Oral)   Resp 14   SpO2 100%   Physical Exam  Constitutional: He is oriented to person,  place, and time. He appears well-developed and well-nourished.  HENT:  Head: Normocephalic and atraumatic.  Eyes: Pupils are equal, round, and reactive to light.  Neck: Normal range of motion. Neck supple.  Cardiovascular: Normal rate, regular rhythm and normal heart sounds.   Pulmonary/Chest: Effort normal and breath sounds normal. No respiratory distress. He has no wheezes. He has no rales. He exhibits no tenderness.  Abdominal: Soft. Bowel sounds are normal. There is no tenderness. There is no rebound and no guarding.  Musculoskeletal: Normal range of motion. He exhibits no edema.  Mild pain on ROM of the left hip. No swelling. No overlying rash. No pain to the knee. No pain along the spine. Pedal pulses are itnact. Normal motor function in the leg.   Lymphadenopathy:    He has  no cervical adenopathy.  Neurological: He is alert and oriented to person, place, and time.  Skin: Skin is warm and dry. No rash noted.  Psychiatric: He has a normal mood and affect.     ED Treatments / Results  DIAGNOSTIC STUDIES: Oxygen Saturation is 100% on RA, normal by my interpretation.    COORDINATION OF CARE: 6:40 PM- Pt advised of plan for treatment and pt agrees.  Labs (all labs ordered are listed, but only abnormal results are displayed) Labs Reviewed - No data to display  EKG  EKG Interpretation None       Radiology Dg Hip Unilat With Pelvis 2-3 Views Left  Result Date: 04/11/2017 CLINICAL DATA:  83 year old with 1 month history of left lateral hip pain and left buttock pain. No recent injuries. Prior left hip arthroplasty. EXAM: DG HIP (WITH OR WITHOUT PELVIS) 2-3V LEFT COMPARISON:  Nuclear medicine three-phase bone scan of the left hip 02/04/2017. Bone window images from CT abdomen and pelvis 06/04/2014. Left hip x-rays 03/26/2013. FINDINGS: Anatomic alignment of the left hip prosthesis without acute complicating features. Remote healed fracture involving the proximal left femoral  metaphysis. The distal tips of 2 screws remain in the medial cortex related to a remote ORIF. No evidence of acute fracture. Mild remodeling of the left acetabulum as noted previously. Included AP pelvis demonstrates moderate narrowing of the joint space of the contralateral right hip. Sacroiliac joints and symphysis pubis intact. Prior aortobiiliac stent graft with coil occlusion of the right hypogastric artery. Native iliofemoral artery atherosclerosis. IMPRESSION: 1. No acute osseous abnormalities. 2. Left hip arthroplasty with anatomic alignment and no complicating features. 3. Remote healed fracture involving the proximal left femoral metaphysis. Electronically Signed   By: Evangeline Dakin M.D.   On: 04/11/2017 15:29    Procedures Procedures (including critical care time)  Medications Ordered in ED Medications  HYDROcodone-acetaminophen (NORCO/VICODIN) 5-325 MG per tablet 2 tablet (not administered)     Initial Impression / Assessment and Plan / ED Course  I have reviewed the triage vital signs and the nursing notes.  Pertinent labs & imaging results that were available during my care of the patient were reviewed by me and considered in my medical decision making (see chart for details).     Patient pain the left hip. It's reproducible on palpation and range of motion. There is no suggestions of infection. It's likely chronic pain related to his prior surgeries. He is neurologically intact. No spinal pain. I will give him a short course of hydrocodone which has worked for him in the past. He is going to follow-up with his PCP for ongoing pain management.  Final Clinical Impressions(s) / ED Diagnoses   Final diagnoses:  Left hip pain    New Prescriptions New Prescriptions   HYDROCODONE-ACETAMINOPHEN (NORCO/VICODIN) 5-325 MG TABLET    Take 1-2 tablets by mouth every 4 (four) hours as needed.   I personally performed the services described in this documentation, which was scribed in  my presence.  The recorded information has been reviewed and considered.     Malvin Johns, MD 04/11/17 [id number]

## 2017-04-11 NOTE — ED Triage Notes (Signed)
Pt reports left hip pain. Hx of hip replacement 10 years ago and for the past few months he has been having this left hip pain. He walks at home with a walker. Dr. Nelva Bush gave him "an injection in his hip" on June 5th and is not helping

## 2017-06-22 ENCOUNTER — Encounter: Payer: Self-pay | Admitting: Vascular Surgery

## 2017-06-29 ENCOUNTER — Ambulatory Visit (HOSPITAL_COMMUNITY)
Admission: RE | Admit: 2017-06-29 | Discharge: 2017-06-29 | Disposition: A | Discharge status: Home / Self Care | Payer: Medicare HMO | Source: Ambulatory Visit | Attending: Vascular Surgery | Admitting: Vascular Surgery

## 2017-06-29 ENCOUNTER — Encounter: Payer: Self-pay | Admitting: Vascular Surgery

## 2017-06-29 ENCOUNTER — Ambulatory Visit (INDEPENDENT_AMBULATORY_CARE_PROVIDER_SITE_OTHER): Payer: Medicare HMO | Admitting: Vascular Surgery

## 2017-06-29 VITALS — BP 131/79 | HR 52 | Temp 97.3°F | Ht 71.0 in | Wt 133.1 lb

## 2017-06-29 DIAGNOSIS — I723 Aneurysm of iliac artery: Secondary | ICD-10-CM | POA: Diagnosis not present

## 2017-06-29 DIAGNOSIS — I739 Peripheral vascular disease, unspecified: Secondary | ICD-10-CM

## 2017-06-29 DIAGNOSIS — Z95828 Presence of other vascular implants and grafts: Secondary | ICD-10-CM | POA: Insufficient documentation

## 2017-06-29 NOTE — Progress Notes (Signed)
Patient name: Robert [name] MRN: [id number] DOB: 1931/11/16 Sex: male  REASON FOR VISIT:    Follow up after repair of right common iliac artery aneurysm.  HPI:   Robert [name] is a pleasant 83 y.o. male who underwent stent graft repair of a right common iliac artery aneurysm by Dr. Kellie Simmering with a bifurcated stent graft. The repair was in 2009 and I cannot locate this op note. I last saw the patient on 06/16/2016. Duplex at that time showed that the abdominal aorta measured 2.5 cm in maximum diameter. The right common iliac artery measured 2.8 cm in the left common iliac 1.8 cm. I recommended a follow up duplex scan in 1 year.  Since I saw him last, he denies any abdominal pain or back pain. He does complain of some hip pain but has had surgery on both hips. I do not get any history of claudication, rest pain, or nonhealing ulcers.  He has had some palpitations recently. He denies any significant chest pressure.  Past Medical History:  Diagnosis Date  . Atrial fibrillation ([place])   . CAD (coronary artery disease)    DES to Cfx  . Diabetes mellitus without complication ([place])   . History of nuclear stress test 02/2012   lexiscan; mild-mod perfusion defect in basal inferoseptal, mid inferoseptal apical anterior; no change from previous study, abnormal test   . Hyperlipidemia   . Hypertension   . Myocardial infarction ([place]) 1980s  . PAD (peripheral artery disease) ([place])    aortic stent graft 2008  . PUD (peptic ulcer disease)   . Small bowel obstruction ([place]) 10/2011  . Ulcer     Family History  Problem Relation Age of Onset  . Stroke Father   . Heart disease Father   . Hypertension Father   . Heart disease Daughter        Before age 34  . Diabetes Sister        DVT-Blood clots in vein    SOCIAL HISTORY: Social History  Substance Use Topics  . Smoking status: Former Smoker    Years: 0.50    Types: Cigarettes    Quit date: 07/18/1992  . Smokeless tobacco: Never Used   Comment: quit around HA time  . Alcohol use No    Allergies  Allergen Reactions  . Diona Fanti W/Codeine [Aspirin-Codeine] Other (See Comments)    Irritates his water system    Current Outpatient Prescriptions  Medication Sig Dispense Refill  . ALPHAGAN P 0.1 % SOLN Place 1 drop into both eyes at bedtime.   0  . brimonidine-timolol (COMBIGAN) 0.2-0.5 % ophthalmic solution Place 1 drop into both eyes daily.    . clopidogrel (PLAVIX) 75 MG tablet Take 75 mg by mouth daily.      . finasteride (PROSCAR) 5 MG tablet Take 5 mg by mouth daily.      . furosemide (LASIX) 40 MG tablet Take 40 mg by mouth daily.   1  . latanoprost (XALATAN) 0.005 % ophthalmic solution Place 1 drop into the left eye at bedtime.     Marland Kitchen lisinopril (PRINIVIL,ZESTRIL) 20 MG tablet take 1 tablet by mouth once daily 90 tablet 2  . metFORMIN (GLUCOPHAGE) 500 MG tablet Take 500 mg by mouth 2 (two) times daily.  1  . simvastatin (ZOCOR) 40 MG tablet Take 40 mg by mouth daily.   0  . HYDROcodone-acetaminophen (NORCO/VICODIN) 5-325 MG tablet Take 1-2 tablets by mouth every 4 (four) hours as needed. (Patient not taking: Reported  on 06/29/2017) 15 tablet 0   No current facility-administered medications for this visit.     REVIEW OF SYSTEMS:  [X]  denotes positive finding, [ ]  denotes negative finding Cardiac  Comments:  Chest pain or chest pressure:    Shortness of breath upon exertion:    Short of breath when lying flat:    Irregular heart rhythm: X Palpitations       Vascular    Pain in calf, thigh, or hip brought on by ambulation:    Pain in feet at night that wakes you up from your sleep:     Blood clot in your veins:    Leg swelling:         Pulmonary    Oxygen at home:    Productive cough:     Wheezing:         Neurologic    Sudden weakness in arms or legs:     Sudden numbness in arms or legs:     Sudden onset of difficulty speaking or slurred speech:    Temporary loss of vision in one eye:     Problems with  dizziness:         Gastrointestinal    Blood in stool:     Vomited blood:         Genitourinary    Burning when urinating:     Blood in urine:        Psychiatric    Major depression:         Hematologic    Bleeding problems: X Nosebleeds   Problems with blood clotting too easily:        Skin    Rashes or ulcers:        Constitutional    Fever or chills:     PHYSICAL EXAM:   Vitals:   06/29/17 0946  BP: 131/79  Pulse: (!) 52  Temp: (!) 97.3 F (36.3 C)  TempSrc: Oral  SpO2: 98%  Weight: 133 lb 1.6 oz (60.4 kg)  Height: 5\' 11"  (1.803 m)    GENERAL: The patient is a well-nourished male, in no acute distress. The vital signs are documented above. CARDIAC: There is a regular rate and rhythm.  VASCULAR: I do not detect carotid bruits. He has palpable femoral and popliteal pulses bilaterally. I cannot palpate pedal pulses. He has no significant lower extremity swelling. PULMONARY: There is good air exchange bilaterally without wheezing or rales. ABDOMEN: Soft and non-tender with normal pitched bowel sounds. I do not palpate an abdominal aortic aneurysm. MUSCULOSKELETAL: There are no major deformities or cyanosis. NEUROLOGIC: No focal weakness or paresthesias are detected. SKIN: There are no ulcers or rashes noted. PSYCHIATRIC: The patient has a normal affect.  DATA:    DUPLEX ABDOMINAL AORTA: I have independently interpreted his duplex of the abdominal aorta today. The maximum diameter of the infrarenal aorta is 2.4 cm. This is stable compared to one year ago. The maximum diameter of the right common iliac artery is 1.8 cm which is down from 2.8 cm a year ago. The maximum diameter of the left common iliac artery is 2 cm which is unchanged compared to one year ago.  MEDICAL ISSUES:   STATUS POST REPAIR OF RIGHT COMMON ILIAC ARTERY ANEURYSM: The patient is doing well status post endovascular repair of his right common iliac artery aneurysm. The aneurysm in the right  common iliac artery has decreased in size. I have ordered a follow up duplex scan in 1 year and  I'll see him back at that time. Fortunately, he is not a smoker.  TIBIAL ARTERY OCCLUSIVE DISEASE: Based on his exam, he does have evidence of tibial artery occlusive disease bilaterally. However he is asymptomatic. I would not recommend an aggressive workup for this. I have encouraged him to stay as active as possible. He is not a smoker. He is on a statin and is on Plavix.  PALPITATIONS: He has had some palpitations recently and plans on calling his cardiologist Dr. Debara Pickett today to arrange a visit.  Deitra Mayo [facility] [phone]

## 2017-06-30 NOTE — Addendum Note (Signed)
Addended by: Lianne Cure A on: 06/30/2017 11:57 AM   Modules accepted: Orders

## 2017-07-19 ENCOUNTER — Other Ambulatory Visit: Payer: Self-pay | Admitting: Pharmacy Technician

## 2017-07-19 ENCOUNTER — Encounter: Payer: Self-pay | Admitting: Pharmacy Technician

## 2017-07-19 NOTE — Patient Outreach (Signed)
[name]) Care Management  07/19/2017  Robert [name] 06-02-1932 [id number]  Spoke to patients friend Robert [name] in regards to patient Metformin medication adherence. Robert [name] verified that patient takes 2 tablets daily with no barriers. She also stated that Robert [name] prefers getting his medications locally. I informed Robert [name] that she can request the pharmacy contact provider for a 3 month script to receive locally. I also verified if it would be alright if I mailed out an Outreach letter for herself and Robert [name] daughter to review to see if there is anything else [facility] can assist them with regarding his health. She confirmed that would be great.  [name] [place], [place] Management [phone]

## 2017-07-19 NOTE — Patient Outreach (Signed)
[facility]) Care Management  07/19/2017  Robert [name] 1932/03/09 [id number]  Called patient in regards to Metformin medication adherence. Patients daughter answered and stated [name] (family friend) goes to Dr. Visits and also helps patient with medications. I left a message with patients daughter to have [name] contact me at her earliest convenience.   [name] [address], [place] Management [phone]

## 2017-08-03 DIAGNOSIS — H43811 Vitreous degeneration, right eye: Secondary | ICD-10-CM | POA: Diagnosis not present

## 2017-08-03 DIAGNOSIS — H35372 Puckering of macula, left eye: Secondary | ICD-10-CM | POA: Diagnosis not present

## 2017-08-03 DIAGNOSIS — H524 Presbyopia: Secondary | ICD-10-CM | POA: Diagnosis not present

## 2017-08-03 DIAGNOSIS — H401131 Primary open-angle glaucoma, bilateral, mild stage: Secondary | ICD-10-CM | POA: Diagnosis not present

## 2017-08-03 DIAGNOSIS — Z961 Presence of intraocular lens: Secondary | ICD-10-CM | POA: Diagnosis not present

## 2017-08-03 DIAGNOSIS — H25811 Combined forms of age-related cataract, right eye: Secondary | ICD-10-CM | POA: Diagnosis not present

## 2017-08-31 ENCOUNTER — Emergency Department (HOSPITAL_COMMUNITY): Payer: Medicare HMO

## 2017-08-31 ENCOUNTER — Observation Stay (HOSPITAL_COMMUNITY)
Admission: EM | Admit: 2017-08-31 | Discharge: 2017-09-01 | Disposition: A | Discharge status: Home / Self Care | Payer: Medicare HMO | Attending: Cardiovascular Disease | Admitting: Cardiovascular Disease

## 2017-08-31 ENCOUNTER — Other Ambulatory Visit: Payer: Self-pay

## 2017-08-31 ENCOUNTER — Encounter (HOSPITAL_COMMUNITY): Payer: Self-pay | Admitting: *Deleted

## 2017-08-31 DIAGNOSIS — Z7902 Long term (current) use of antithrombotics/antiplatelets: Secondary | ICD-10-CM | POA: Insufficient documentation

## 2017-08-31 DIAGNOSIS — I2510 Atherosclerotic heart disease of native coronary artery without angina pectoris: Secondary | ICD-10-CM | POA: Diagnosis not present

## 2017-08-31 DIAGNOSIS — I4891 Unspecified atrial fibrillation: Secondary | ICD-10-CM | POA: Insufficient documentation

## 2017-08-31 DIAGNOSIS — I493 Ventricular premature depolarization: Secondary | ICD-10-CM | POA: Diagnosis not present

## 2017-08-31 DIAGNOSIS — K56609 Unspecified intestinal obstruction, unspecified as to partial versus complete obstruction: Secondary | ICD-10-CM | POA: Diagnosis not present

## 2017-08-31 DIAGNOSIS — Z903 Acquired absence of stomach [part of]: Secondary | ICD-10-CM | POA: Insufficient documentation

## 2017-08-31 DIAGNOSIS — I471 Supraventricular tachycardia: Secondary | ICD-10-CM | POA: Diagnosis not present

## 2017-08-31 DIAGNOSIS — E119 Type 2 diabetes mellitus without complications: Secondary | ICD-10-CM | POA: Diagnosis not present

## 2017-08-31 DIAGNOSIS — Z87891 Personal history of nicotine dependence: Secondary | ICD-10-CM | POA: Insufficient documentation

## 2017-08-31 DIAGNOSIS — I714 Abdominal aortic aneurysm, without rupture, unspecified: Secondary | ICD-10-CM | POA: Diagnosis present

## 2017-08-31 DIAGNOSIS — R9431 Abnormal electrocardiogram [ECG] [EKG]: Secondary | ICD-10-CM | POA: Diagnosis not present

## 2017-08-31 DIAGNOSIS — Z96642 Presence of left artificial hip joint: Secondary | ICD-10-CM | POA: Insufficient documentation

## 2017-08-31 DIAGNOSIS — Z7984 Long term (current) use of oral hypoglycemic drugs: Secondary | ICD-10-CM | POA: Insufficient documentation

## 2017-08-31 DIAGNOSIS — I252 Old myocardial infarction: Secondary | ICD-10-CM | POA: Insufficient documentation

## 2017-08-31 DIAGNOSIS — M79662 Pain in left lower leg: Secondary | ICD-10-CM | POA: Insufficient documentation

## 2017-08-31 DIAGNOSIS — R0789 Other chest pain: Principal | ICD-10-CM | POA: Insufficient documentation

## 2017-08-31 DIAGNOSIS — Z955 Presence of coronary angioplasty implant and graft: Secondary | ICD-10-CM | POA: Diagnosis not present

## 2017-08-31 DIAGNOSIS — E875 Hyperkalemia: Secondary | ICD-10-CM | POA: Insufficient documentation

## 2017-08-31 DIAGNOSIS — Z8711 Personal history of peptic ulcer disease: Secondary | ICD-10-CM | POA: Diagnosis not present

## 2017-08-31 DIAGNOSIS — Z833 Family history of diabetes mellitus: Secondary | ICD-10-CM | POA: Insufficient documentation

## 2017-08-31 DIAGNOSIS — I110 Hypertensive heart disease with heart failure: Secondary | ICD-10-CM | POA: Insufficient documentation

## 2017-08-31 DIAGNOSIS — Z886 Allergy status to analgesic agent status: Secondary | ICD-10-CM | POA: Insufficient documentation

## 2017-08-31 DIAGNOSIS — E785 Hyperlipidemia, unspecified: Secondary | ICD-10-CM | POA: Diagnosis not present

## 2017-08-31 DIAGNOSIS — R079 Chest pain, unspecified: Secondary | ICD-10-CM | POA: Diagnosis not present

## 2017-08-31 DIAGNOSIS — I5032 Chronic diastolic (congestive) heart failure: Secondary | ICD-10-CM | POA: Insufficient documentation

## 2017-08-31 DIAGNOSIS — Z823 Family history of stroke: Secondary | ICD-10-CM | POA: Insufficient documentation

## 2017-08-31 DIAGNOSIS — Z885 Allergy status to narcotic agent status: Secondary | ICD-10-CM | POA: Insufficient documentation

## 2017-08-31 DIAGNOSIS — R200 Anesthesia of skin: Secondary | ICD-10-CM | POA: Insufficient documentation

## 2017-08-31 DIAGNOSIS — I351 Nonrheumatic aortic (valve) insufficiency: Secondary | ICD-10-CM | POA: Insufficient documentation

## 2017-08-31 DIAGNOSIS — Z8249 Family history of ischemic heart disease and other diseases of the circulatory system: Secondary | ICD-10-CM | POA: Insufficient documentation

## 2017-08-31 DIAGNOSIS — Z79899 Other long term (current) drug therapy: Secondary | ICD-10-CM | POA: Insufficient documentation

## 2017-08-31 DIAGNOSIS — R002 Palpitations: Secondary | ICD-10-CM | POA: Insufficient documentation

## 2017-08-31 DIAGNOSIS — Z7982 Long term (current) use of aspirin: Secondary | ICD-10-CM | POA: Insufficient documentation

## 2017-08-31 DIAGNOSIS — I10 Essential (primary) hypertension: Secondary | ICD-10-CM | POA: Diagnosis present

## 2017-08-31 LAB — I-STAT CHEM 8, ED
BUN: 10 mg/dL (ref 6–20)
CREATININE: 0.9 mg/dL (ref 0.61–1.24)
Calcium, Ion: 1.11 mmol/L — ABNORMAL LOW (ref 1.15–1.40)
Chloride: 105 mmol/L (ref 101–111)
GLUCOSE: 154 mg/dL — AB (ref 65–99)
HCT: 39 % (ref 39.0–52.0)
HEMOGLOBIN: 13.3 g/dL (ref 13.0–17.0)
POTASSIUM: 4 mmol/L (ref 3.5–5.1)
Sodium: 141 mmol/L (ref 135–145)
TCO2: 27 mmol/L (ref 22–32)

## 2017-08-31 LAB — CBC
HCT: 37.5 % — ABNORMAL LOW (ref 39.0–52.0)
HEMOGLOBIN: 12 g/dL — AB (ref 13.0–17.0)
MCH: 26.7 pg (ref 26.0–34.0)
MCHC: 32 g/dL (ref 30.0–36.0)
MCV: 83.5 fL (ref 78.0–100.0)
PLATELETS: 275 10*3/uL (ref 150–400)
RBC: 4.49 MIL/uL (ref 4.22–5.81)
RDW: 14.7 % (ref 11.5–15.5)
WBC: 4.8 10*3/uL (ref 4.0–10.5)

## 2017-08-31 LAB — BASIC METABOLIC PANEL
ANION GAP: 9 (ref 5–15)
BUN: 9 mg/dL (ref 6–20)
CALCIUM: 9.5 mg/dL (ref 8.9–10.3)
CO2: 24 mmol/L (ref 22–32)
CREATININE: 0.96 mg/dL (ref 0.61–1.24)
Chloride: 106 mmol/L (ref 101–111)
GFR calc Af Amer: >60 mL/min (ref >60)
GLUCOSE: 160 mg/dL — AB (ref 65–99)
Potassium: 3.5 mmol/L (ref 3.5–5.1)
Sodium: 139 mmol/L (ref 135–145)

## 2017-08-31 LAB — I-STAT TROPONIN, ED
TROPONIN I, POC: 0 ng/mL (ref 0.00–0.08)
TROPONIN I, POC: 0 ng/mL (ref 0.00–0.08)

## 2017-08-31 MED ORDER — BRIMONIDINE TARTRATE 0.15 % OP SOLN
1.0000 [drp] | Freq: Every day | OPHTHALMIC | Status: DC
Start: 2017-08-31 — End: 2017-08-31

## 2017-08-31 MED ORDER — BRIMONIDINE TARTRATE-TIMOLOL 0.2-0.5 % OP SOLN
1.0000 [drp] | Freq: Every day | OPHTHALMIC | Status: DC
  Filled 2017-08-31: qty 5

## 2017-08-31 MED ORDER — METFORMIN HCL 500 MG PO TABS: 500.0000 mg | ORAL_TABLET | Freq: Two times a day (BID) | ORAL | Status: DC

## 2017-08-31 MED ORDER — ACETAMINOPHEN 325 MG PO TABS
650.0000 mg | ORAL_TABLET | ORAL | Status: DC | PRN
  Administered 2017-09-01: 650 mg via ORAL
  Filled 2017-08-31: qty 2

## 2017-08-31 MED ORDER — HEPARIN SODIUM (PORCINE) 5000 UNIT/ML IJ SOLN
5000.0000 [IU] | Freq: Three times a day (TID) | INTRAMUSCULAR | Status: DC
  Administered 2017-09-01: 5000 [IU] via SUBCUTANEOUS
  Filled 2017-08-31: qty 1

## 2017-08-31 MED ORDER — NITROGLYCERIN 0.4 MG SL SUBL: 0.4000 mg | SUBLINGUAL_TABLET | SUBLINGUAL | Status: DC | PRN

## 2017-08-31 MED ORDER — ASPIRIN 300 MG RE SUPP: 300.0000 mg | RECTAL | Status: DC

## 2017-08-31 MED ORDER — CLOPIDOGREL BISULFATE 75 MG PO TABS
75.0000 mg | ORAL_TABLET | Freq: Every day | ORAL | Status: DC
  Administered 2017-09-01: 75 mg via ORAL
  Filled 2017-08-31: qty 1

## 2017-08-31 MED ORDER — ONDANSETRON HCL 4 MG/2ML IJ SOLN: 4.0000 mg | Freq: Four times a day (QID) | INTRAMUSCULAR | Status: DC | PRN

## 2017-08-31 MED ORDER — FINASTERIDE 5 MG PO TABS
5.0000 mg | ORAL_TABLET | Freq: Every day | ORAL | Status: DC
  Filled 2017-08-31 (×2): qty 1

## 2017-08-31 MED ORDER — SIMVASTATIN 40 MG PO TABS
40.0000 mg | ORAL_TABLET | Freq: Every day | ORAL | Status: DC
  Filled 2017-08-31: qty 1

## 2017-08-31 MED ORDER — ASPIRIN EC 81 MG PO TBEC
81.0000 mg | DELAYED_RELEASE_TABLET | Freq: Every day | ORAL | Status: DC
  Administered 2017-09-01: 81 mg via ORAL
  Filled 2017-08-31: qty 1

## 2017-08-31 MED ORDER — ASPIRIN 81 MG PO CHEW: 324.0000 mg | CHEWABLE_TABLET | ORAL | Status: DC

## 2017-08-31 MED ORDER — LISINOPRIL 10 MG PO TABS
20.0000 mg | ORAL_TABLET | Freq: Every day | ORAL | Status: DC
  Administered 2017-09-01: 20 mg via ORAL
  Filled 2017-08-31: qty 2

## 2017-08-31 MED ORDER — LATANOPROST 0.005 % OP SOLN
1.0000 [drp] | Freq: Every day | OPHTHALMIC | Status: DC
  Filled 2017-08-31: qty 2.5

## 2017-08-31 NOTE — ED Notes (Signed)
Family asking when Cardiology will arrive.

## 2017-08-31 NOTE — ED Provider Notes (Signed)
[facility] EMERGENCY DEPARTMENT Provider Note   CSN: [id number] Arrival date & time: 08/31/17  1245     History   Chief Complaint Chief Complaint  Patient presents with  . Chest Pain    HPI Robert [name] is a 83 y.o. male.  The history is provided by the patient and the spouse. No language interpreter was used.  Chest Pain     Robert [name] is a 83 y.o. male who presents to the Emergency Department complaining of chest pain.  He presents for evaluation of left-sided chest pain that started about 1 hour prior to ED arrival.  History is provided by the patient and his wife.  His wife reports that he has been mildly confused for months.  No associated fevers, cough, shortness of breath, diaphoresis, nausea, vomiting, abdominal pain.  No prior similar symptoms. Past Medical History:  Diagnosis Date  . Atrial fibrillation ([place])   . CAD (coronary artery disease)    DES to Cfx  . Diabetes mellitus without complication ([place])   . History of nuclear stress test 02/2012   lexiscan; mild-mod perfusion defect in basal inferoseptal, mid inferoseptal apical anterior; no change from previous study, abnormal test   . Hyperlipidemia   . Hypertension   . Myocardial infarction ([place]) 1980s  . PAD (peripheral artery disease) ([place])    aortic stent graft 2008  . PUD (peptic ulcer disease)   . Small bowel obstruction ([place]) 10/2011  . Ulcer     Patient Active Problem List   Diagnosis Date Noted  . Other fatigue 06/23/2016  . Frequent PVCs 06/23/2016  . Symptomatic bradycardia 05/20/2015  . Pain of left lower extremity-Left Hip/Leg 06/04/2014  . Numbness-Bilat Leg 06/04/2014  . Weakness of both legs 06/04/2014  . Aftercare following surgery of the circulatory system, [place] 10/02/2013  . AAA (abdominal aortic aneurysm) ([place]) 07/18/2012  . Aneurysm of iliac artery ([place]) 07/18/2012  . Renal mass, left 11/07/2011  . SBO (small bowel obstruction) ([place]) 11/07/2011  .  Hyperkalemia 11/07/2011  . CAD (coronary artery disease)   . Inferior MI ([place])   . Hyperlipidemia   . Hypertension   . Ulcer-s/p Gastrectomy 40 yr ago     Past Surgical History:  Procedure Laterality Date  . CARDIAC CATHETERIZATION  1987   occluded marginal branch of RCA, treated medically - Dr Melvern Banker  . CORONARY ANGIOPLASTY WITH STENT PLACEMENT  03/2003   2.5x26mm Taxus DES to Cfx  . EYE SURGERY Right    cataract surgery  . GASTRECTOMY  50 yrs ago - 2 operations   had surgery at the age of 30 for the ulcer  . ILIAC ARTERY ANEURYSM REPAIR     Repaied by Dr. Kellie Simmering april 2009  . JOINT REPLACEMENT Left    hip replacement  . TRANSTHORACIC ECHOCARDIOGRAM  11/2009   EF=>55%, mild conc LVH, normal LV systolic function; mild MR; mild-mod TR with RV systolic pressure elevated at 30-53mmHg; mild-mod AV regurg; mild pulm valve regurg       Home Medications    Prior to Admission medications   Medication Sig Start Date End Date Taking? Authorizing Provider  ALPHAGAN P 0.1 % SOLN Place 1 drop into both eyes at bedtime.  09/16/15   [provider]  brimonidine-timolol (COMBIGAN) 0.2-0.5 % ophthalmic solution Place 1 drop into both eyes daily.    [provider]  clopidogrel (PLAVIX) 75 MG tablet Take 75 mg by mouth daily.      [provider]  finasteride (PROSCAR) 5 MG tablet Take 5 mg by mouth daily.      [provider]  furosemide (LASIX) 40 MG tablet Take 40 mg by mouth daily.  08/29/14   [provider]  HYDROcodone-acetaminophen (NORCO/VICODIN) 5-325 MG tablet Take 1-2 tablets by mouth every 4 (four) hours as needed. Patient not taking: Reported on 06/29/2017 04/11/17   Malvin Johns, MD  latanoprost (XALATAN) 0.005 % ophthalmic solution Place 1 drop into the left eye at bedtime.     [provider]  lisinopril (PRINIVIL,ZESTRIL) 20 MG tablet take 1 tablet by mouth once daily 09/14/16   Hilty, Nadean Corwin, MD  metFORMIN (GLUCOPHAGE)  500 MG tablet Take 500 mg by mouth 2 (two) times daily. 09/04/15   [provider]  simvastatin (ZOCOR) 40 MG tablet Take 40 mg by mouth daily.  08/29/14   [provider]    Family History Family History  Problem Relation Age of Onset  . Stroke Father   . Heart disease Father   . Hypertension Father   . Heart disease Daughter        Before age 74  . Diabetes Sister        DVT-Blood clots in vein    Social History Social History   Tobacco Use  . Smoking status: Former Smoker    Years: 0.50    Types: Cigarettes    Last attempt to quit: 07/18/1992    Years since quitting: 25.1  . Smokeless tobacco: Never Used  . Tobacco comment: quit around HA time  Substance Use Topics  . Alcohol use: No  . Drug use: No     Allergies   Asa w/codeine [aspirin-codeine]   Review of Systems Review of Systems  Cardiovascular: Positive for chest pain.  All other systems reviewed and are negative.    Physical Exam Updated Vital Signs BP 133/65   Pulse (!) 102   Temp 98.3 F (36.8 C) (Oral)   Resp 19   Ht 5\' 11"  (1.803 m)   Wt 63.5 kg (140 lb)   SpO2 99%   BMI 19.53 kg/m   Physical Exam  Constitutional: He appears well-developed and well-nourished.  HENT:  Head: Normocephalic and atraumatic.  Cardiovascular: Normal rate and normal pulses.  No murmur heard. tachycardic  Pulmonary/Chest: Effort normal and breath sounds normal. No respiratory distress.  Abdominal: Soft. There is no tenderness. There is no rebound and no guarding.  Musculoskeletal: He exhibits no edema or tenderness.  Neurological: He is alert.  Disoriented to time.  Mildly confused and slow to answer questions.  5/5 strength in all four extremities.    Skin: Skin is warm and dry.  Psychiatric: He has a normal mood and affect. His behavior is normal.  Nursing note and vitals reviewed.    ED Treatments / Results  Labs (all labs ordered are listed, but only abnormal results are  displayed) Labs Reviewed  CBC - Abnormal; Notable for the following components:      Result Value   Hemoglobin 12.0 (*)    HCT 37.5 (*)    All other components within normal limits  BASIC METABOLIC PANEL  I-STAT TROPONIN, ED  I-STAT CHEM 8, ED    EKG  EKG Interpretation  Date/Time:  Wednesday August 31 2017 12:56:35 EST Ventricular Rate:  109 PR Interval:  204 QRS Duration: 84 QT Interval:  330 QTC Calculation: 444 R Axis:   38 Text Interpretation:  Sinus tachycardia Anteroseptal infarct , age undetermined Marked ST  abnormality, possible inferior subendocardial injury Abnormal ECG Confirmed by Quintella Reichert [id number]) on 08/31/2017 1:43:08 PM       Radiology No results found.  Procedures Procedures (including critical care time)  Medications Ordered in ED Medications - No data to display   Initial Impression / Assessment and Plan / ED Course  I have reviewed the triage vital signs and the nursing notes.  Pertinent labs & imaging results that were available during my care of the patient were reviewed by me and considered in my medical decision making (see chart for details).    Patient with history of atrial fibrillation, coronary artery disease, diabetes, iliac artery aneurysm here for episode of chest pain.  Chest pain has resolved on ED arrival and patient is unable to elaborate on his symptoms.  EKG does have inferior lateral ST depressions, similar compared to priors.  Initial labs are within normal limits, cardiology recommendations appreciated.  Patient did have an episode of heart rate in the 170s on the monitor, appeared to be SVT versus A. fib with RVR.  Arrhythmia spontaneously terminated prior to being able to capture the rhythm.  He was not complaining of chest pain during arrhythmia.  Final Clinical Impressions(s) / ED Diagnoses   Final diagnoses:  None    ED Discharge Orders    None       Quintella Reichert, MD 08/31/17 [phone]

## 2017-08-31 NOTE — H&P (Signed)
CARDIOLOGY H&P  HPI: Robert [name] is a 83 y.o. male w/ history of CAD (IMI with RCA CTO on cath in 1980's, LCx stent placed 2004), AF (not on anticoagulation), DM, AAA s/p EVAR 2008, HTN, HLD presenting with chest pain.  Patient developed chest pain today while driving lasting approximately 1 to 2 hours. He noticed some palpitations at the time as well. In the past he has had occasional fleeting chest pain lasting less than 1 minute and resolving spontaneously. This however happens less than once per month. He has never had an episode as severe and as long lasting as the episode that occurred today. He went to his daughter's house and told her to take him to the ED.  Notably the patient lives with his daughter and has occasional episodes of confusion but is independent of ADL's and IADL's per the daughter's report. He does not do any formal exercise but he does get up and around frequently and keeps himself busy. He does not have any chest pain or pressure with exertion. He has no DOE, PND, orthopnea. He endorses a 20 lb weight loss over the last 6 months, attributing this to "not eating well."   In the ED the patient was found to have some SVT, however this was not documented by ECG. His symptoms did not seem to be related to his SVT per ED providers. An initial ECG did however reveal sinus tachycardia with diffuse ST depressions and anteroseptal infarct pattern. Notably his chest pain had resolved by this time. Subsequent ECG revealed improvement in heart rate with sinus rhythm in the 80's and improvement in his ST segments with only residual nonspecific T wave changes.   Review of Systems:     Cardiac Review of Systems: {Y] = yes [ ]  = no  Chest Pain [  Y  ]  Resting SOB [   ] Exertional SOB  [  ]  Orthopnea [  ]   Pedal Edema [   ]    Palpitations [ Y ] Syncope  [  ]   Presyncope [   ]  General Review of Systems: [Y] = yes [  ]=no Constitional: recent weight change [ Y ]; anorexia [ Y ];  fatigue [ Y ]; nausea [ Y ]; night sweats [  ]; fever [  ]; or chills [  ];                                                                     Dental: poor dentition[  ];   Eye : blurred vision [  ]; diplopia [   ]; vision changes [  ];  Amaurosis fugax[  ]; Resp: cough [  ];  wheezing[  ];  hemoptysis[  ]; shortness of breath[  ]; paroxysmal nocturnal dyspnea[  ]; dyspnea on exertion[  ]; or orthopnea[  ];  GI:  gallstones[  ], vomiting[  ];  dysphagia[  ]; melena[  ];  hematochezia [  ]; heartburn[  ];   GU: kidney stones [  ]; hematuria[  ];   dysuria [  ];  nocturia[  ];               Skin: rash [  ],  swelling[  ];, hair loss[  ];  peripheral edema[  ];  or itching[  ]; Musculosketetal: myalgias[  ];  joint swelling[  ];  joint erythema[  ];  joint pain[  ];  back pain[  ];  Heme/Lymph: bruising[  ];  bleeding[  ];  anemia[  ];  Neuro: TIA[  ];  headaches[  ];  stroke[  ];  vertigo[  ];  seizures[  ];   paresthesias[  ];  difficulty walking[  ];  Psych:depression[  ]; anxiety[  ];  Endocrine: diabetes[  ];  thyroid dysfunction[  ];  Other:  Past Medical History:  Diagnosis Date  . Atrial fibrillation ([place])   . CAD (coronary artery disease)    DES to Cfx  . Diabetes mellitus without complication ([place])   . History of nuclear stress test 02/2012   lexiscan; mild-mod perfusion defect in basal inferoseptal, mid inferoseptal apical anterior; no change from previous study, abnormal test   . Hyperlipidemia   . Hypertension   . Myocardial infarction ([place]) 1980s  . PAD (peripheral artery disease) ([place])    aortic stent graft 2008  . PUD (peptic ulcer disease)   . Small bowel obstruction ([place] [facility]) 10/2011  . Ulcer     Prior to Admission medications   Medication Sig Start Date End Date Taking? Authorizing Provider  ALPHAGAN P 0.1 % SOLN Place 1 drop into both eyes at bedtime.  09/16/15  Yes [provider]  clopidogrel (PLAVIX) 75 MG tablet Take 75 mg by mouth daily.     Yes [provider]  finasteride (PROSCAR) 5 MG tablet Take 5 mg by mouth daily.     Yes [provider]  furosemide (LASIX) 40 MG tablet Take 40 mg by mouth daily.  08/29/14  Yes [provider]  latanoprost (XALATAN) 0.005 % ophthalmic solution Place 1 drop into the left eye at bedtime.    Yes [provider]  lisinopril (PRINIVIL,ZESTRIL) 20 MG tablet take 1 tablet by mouth once daily 09/14/16  Yes Hilty, Nadean Corwin, MD  metFORMIN (GLUCOPHAGE) 500 MG tablet Take 500 mg by mouth 2 (two) times daily. 09/04/15  Yes [provider]  simvastatin (ZOCOR) 40 MG tablet Take 40 mg by mouth daily.  08/29/14  Yes [provider]  brimonidine-timolol (COMBIGAN) 0.2-0.5 % ophthalmic solution Place 1 drop into both eyes daily.    [provider]  HYDROcodone-acetaminophen (NORCO/VICODIN) 5-325 MG tablet Take 1-2 tablets by mouth every 4 (four) hours as needed. Patient not taking: Reported on 06/29/2017 04/11/17   Malvin Johns, MD    Allergies  Allergen Reactions  . Diona Fanti W/Codeine [Aspirin-Codeine] Other (See Comments)    Irritates his water system    Social History   Socioeconomic History  . Marital status: Widowed    Spouse name: Not on file  . Number of children: Not on file  . Years of education: Not on file  . Highest education level: Not on file  Social Needs  . Financial resource strain: Not on file  . Food insecurity - worry: Not on file  . Food insecurity - inability: Not on file  . Transportation needs - medical: Not on file  . Transportation needs - non-medical: Not on file  Occupational History  . Not on file  Tobacco Use  . Smoking status: Former Smoker    Years: 0.50    Types: Cigarettes    Last attempt to quit: 07/18/1992    Years since quitting: 25.1  .  Smokeless tobacco: Never Used  . Tobacco comment: quit around HA time  Substance and Sexual Activity  . Alcohol use: No  . Drug use: No  . Sexual activity: Not on file  Other  Topics Concern  . Not on file  Social History Narrative   Used to be a Psychologist, sport and exercise and Architect. From [facility] [place] originally.    Family History  Problem Relation Age of Onset  . Stroke Father   . Heart disease Father   . Hypertension Father   . Heart disease Daughter        Before age 89  . Diabetes Sister        DVT-Blood clots in vein    PHYSICAL EXAM: Vitals:   08/31/17 2100 08/31/17 2115  BP: 124/78 122/72  Pulse: 60 (!) 59  Resp: 17 13  Temp:    SpO2: 98% 96%   General:  Elderly. NAD. HEENT: normal Neck: supple. no JVD. Carotids 2+ bilat; no bruits. No lymphadenopathy or thryomegaly appreciated. Cor: PMI nondisplaced. Very distant heart sounds. Regular rate & rhythm. No rubs, gallops or murmurs. Lungs: clear Abdomen: soft, nontender, nondistended. No hepatosplenomegaly. No bruits or masses. Good bowel sounds. Extremities: no cyanosis, clubbing, rash, edema Neuro: alert & oriented x 3, cranial nerves grossly intact. moves all 4 extremities w/o difficulty. Affect pleasant.  ECG: Diffuse ST depressions while sinus tachycardia earlier in ED course, ST segments improving with decreased heart rates  Results for orders placed or performed during the hospital encounter of 08/31/17 (from the past 24 hour(s))  Basic metabolic panel     Status: Abnormal   Collection Time: 08/31/17  1:06 PM  Result Value Ref Range   Sodium 139 135 - 145 mmol/L   Potassium 3.5 3.5 - 5.1 mmol/L   Chloride 106 101 - 111 mmol/L   CO2 24 22 - 32 mmol/L   Glucose, Bld 160 (H) 65 - 99 mg/dL   BUN 9 6 - 20 mg/dL   Creatinine, Ser 0.96 0.61 - 1.24 mg/dL   Calcium 9.5 8.9 - 10.3 mg/dL   GFR calc non Af Amer >60 >60 mL/min   GFR calc Af Amer >60 >60 mL/min   Anion gap 9 5 - 15  CBC     Status: Abnormal   Collection Time: 08/31/17  1:06 PM  Result Value Ref Range   WBC 4.8 4.0 - 10.5 K/uL   RBC 4.49 4.22 - 5.81 MIL/uL   Hemoglobin 12.0 (L) 13.0 - 17.0 g/dL   HCT 37.5 (L) 39.0 - 52.0 %   MCV 83.5  78.0 - 100.0 fL   MCH 26.7 26.0 - 34.0 pg   MCHC 32.0 30.0 - 36.0 g/dL   RDW 14.7 11.5 - 15.5 %   Platelets 275 150 - 400 K/uL  I-stat troponin, ED     Status: None   Collection Time: 08/31/17  1:37 PM  Result Value Ref Range   Troponin i, poc 0.00 0.00 - 0.08 ng/mL   Comment 3          I-stat Chem 8, ED     Status: Abnormal   Collection Time: 08/31/17  2:32 PM  Result Value Ref Range   Sodium 141 135 - 145 mmol/L   Potassium 4.0 3.5 - 5.1 mmol/L   Chloride 105 101 - 111 mmol/L   BUN 10 6 - 20 mg/dL   Creatinine, Ser 0.90 0.61 - 1.24 mg/dL   Glucose, Bld 154 (H) 65 - 99 mg/dL  Calcium, Ion 1.11 (L) 1.15 - 1.40 mmol/L   TCO2 27 22 - 32 mmol/L   Hemoglobin 13.3 13.0 - 17.0 g/dL   HCT 39.0 39.0 - 52.0 %  I-stat troponin, ED     Status: None   Collection Time: 08/31/17  4:30 PM  Result Value Ref Range   Troponin i, poc 0.00 0.00 - 0.08 ng/mL   Comment 3           Dg Chest 1 View  Result Date: 08/31/2017 CLINICAL DATA:  Chest pain. EXAM: CHEST 1 VIEW COMPARISON:  09/19/2015 FINDINGS: Numerous leads and wires project over the chest. Midline trachea. Normal heart size. Tortuous thoracic aorta. No pleural effusion or pneumothorax. Bibasilar scarring or subsegmental atelectasis. Hyperinflation. Surgical clips at the gastroesophageal junction. IMPRESSION: Hyperinflation, without acute disease. Electronically Signed   By: Abigail Miyamoto M.D.   On: 08/31/2017 14:07    ASSESSMENT: Robert [name] is a 83 y.o. male w/ history of CAD (IMI with RCA CTO on cath in 1980's, LCx stent placed 2004), AF (not on anticoagulation), DM, AAA s/p EVAR 2008, HTN, HLD presenting with chest pain. The patient has known multivessel CAD and multiple risk factors for worsening coronary atherosclerosis. His story is somewhat atypical, however his dynamic ECG changes are concerning. Overall I believe the patient's story is concerning enough to have him undergo exercise stress testing to evaluate for ischemia. The  patient is able to walk on a treadmill for this. He has had negative cardiac troponin x 2, will therefore hold off on starting heparin drip unless chest pain returns or troponin positive.   PLAN/DISCUSSION: #) Chest pain - repeat troponin x1 - TTE in AM - NPO for stress +/- cath tomorrow - check lipids, A1c - ASA 324mg  then 81mg  daily - hold on heparin drip for now - cont simvastatin 40mg  QD - cont lisinopril 20mg  QD - SLN, nitro gtt PRN - continue home plavix 75mg  QD - hold off on beta blocker for now given documented history of bradycardia  #) DM:  - check A1c - cont home metformin  Marcie Mowers, MD Cardiology Fellow, PGY-5

## 2017-08-31 NOTE — ED Notes (Signed)
Patient transported to X-ray 

## 2017-08-31 NOTE — ED Triage Notes (Signed)
PT with acute onset chest pain and weakness.  PT barely speaking in triage.

## 2017-09-01 ENCOUNTER — Other Ambulatory Visit: Payer: Self-pay

## 2017-09-01 ENCOUNTER — Observation Stay (HOSPITAL_BASED_OUTPATIENT_CLINIC_OR_DEPARTMENT_OTHER): Payer: Medicare HMO

## 2017-09-01 DIAGNOSIS — I5030 Unspecified diastolic (congestive) heart failure: Secondary | ICD-10-CM

## 2017-09-01 DIAGNOSIS — R079 Chest pain, unspecified: Secondary | ICD-10-CM | POA: Diagnosis not present

## 2017-09-01 DIAGNOSIS — R072 Precordial pain: Secondary | ICD-10-CM | POA: Diagnosis not present

## 2017-09-01 LAB — BASIC METABOLIC PANEL
Anion gap: 9 (ref 5–15)
BUN: 12 mg/dL (ref 6–20)
CALCIUM: 8.6 mg/dL — AB (ref 8.9–10.3)
CO2: 24 mmol/L (ref 22–32)
CREATININE: 0.88 mg/dL (ref 0.61–1.24)
Chloride: 103 mmol/L (ref 101–111)
GFR calc Af Amer: >60 mL/min (ref >60)
GLUCOSE: 167 mg/dL — AB (ref 65–99)
Potassium: 3.3 mmol/L — ABNORMAL LOW (ref 3.5–5.1)
Sodium: 136 mmol/L (ref 135–145)

## 2017-09-01 LAB — CBC
HEMATOCRIT: 32.5 % — AB (ref 39.0–52.0)
HEMOGLOBIN: 10.2 g/dL — AB (ref 13.0–17.0)
MCH: 26.3 pg (ref 26.0–34.0)
MCHC: 31.4 g/dL (ref 30.0–36.0)
MCV: 83.8 fL (ref 78.0–100.0)
Platelets: 249 10*3/uL (ref 150–400)
RBC: 3.88 MIL/uL — AB (ref 4.22–5.81)
RDW: 15.2 % (ref 11.5–15.5)
WBC: 4.7 10*3/uL (ref 4.0–10.5)

## 2017-09-01 LAB — NM MYOCAR MULTI W/SPECT W/WALL MOTION / EF
CHL CUP RESTING HR STRESS: 56 {beats}/min
CSEPHR: 64 %
MPHR: 137 {beats}/min
Peak HR: 89 {beats}/min

## 2017-09-01 LAB — ECHOCARDIOGRAM COMPLETE
HEIGHTINCHES: 71 in
Weight: 2112.89 oz

## 2017-09-01 LAB — TROPONIN I

## 2017-09-01 LAB — HEMOGLOBIN A1C
Hgb A1c MFr Bld: 7 % — ABNORMAL HIGH (ref 4.8–5.6)
Mean Plasma Glucose: 154.2 mg/dL

## 2017-09-01 LAB — LIPID PANEL
Cholesterol: 136 mg/dL (ref 0–200)
HDL: 53 mg/dL (ref >40)
LDL CALC: 72 mg/dL (ref 0–99)
Total CHOL/HDL Ratio: 2.6 RATIO
Triglycerides: 57 mg/dL (ref <150)
VLDL: 11 mg/dL (ref 0–40)

## 2017-09-01 MED ORDER — TECHNETIUM TC 99M TETROFOSMIN IV KIT
30.0000 | PACK | Freq: Once | INTRAVENOUS | Status: AC | PRN
  Administered 2017-09-01: 30 via INTRAVENOUS

## 2017-09-01 MED ORDER — BRIMONIDINE TARTRATE 0.2 % OP SOLN
1.0000 [drp] | Freq: Every day | OPHTHALMIC | Status: DC
  Administered 2017-09-01: 1 [drp] via OPHTHALMIC
  Filled 2017-09-01: qty 5

## 2017-09-01 MED ORDER — ENSURE ENLIVE PO LIQD
237.0000 mL | Freq: Two times a day (BID) | ORAL | Status: DC
  Administered 2017-09-01: 237 mL via ORAL

## 2017-09-01 MED ORDER — TECHNETIUM TC 99M TETROFOSMIN IV KIT
10.0000 | PACK | Freq: Once | INTRAVENOUS | Status: AC
  Administered 2017-09-01: 10 via INTRAVENOUS

## 2017-09-01 MED ORDER — REGADENOSON 0.4 MG/5ML IV SOLN
INTRAVENOUS | Status: AC
  Filled 2017-09-01: qty 5

## 2017-09-01 MED ORDER — REGADENOSON 0.4 MG/5ML IV SOLN
0.4000 mg | Freq: Once | INTRAVENOUS | Status: AC
  Administered 2017-09-01: 0.4 mg via INTRAVENOUS

## 2017-09-01 MED ORDER — TIMOLOL MALEATE 0.5 % OP SOLN
1.0000 [drp] | Freq: Every day | OPHTHALMIC | Status: DC
  Administered 2017-09-01: 1 [drp] via OPHTHALMIC
  Filled 2017-09-01: qty 5

## 2017-09-01 NOTE — ED Notes (Signed)
Called 4E to give report.  Receiving RN unable to take report at this time

## 2017-09-01 NOTE — Discharge Summary (Signed)
Discharge Summary    Patient ID: Robert [name],  MRN: [id number], DOB/AGE: 06/26/1932 83 y.o.  Admit date: 08/31/2017 Discharge date: 09/01/2017   Primary Care Provider: Lavone Orn Primary Cardiologist: Dr. Debara Pickett  Discharge Diagnoses    Principal Problem:   Chest pain Active Problems:   CAD (coronary artery disease)   Hyperlipidemia   Hypertension   AAA (abdominal aortic aneurysm) ([facility])   Allergies Allergies  Allergen Reactions  . Diona Fanti W/Codeine [Aspirin-Codeine] Other (See Comments)    Irritates his water system     History of Present Illness     Robert [name] is a 83 y.o. male w/ history of CAD (IMI with RCA CTO on cath in 1980's, LCx stent placed 2004), AF (not on anticoagulation), DM, AAA s/p EVAR 2008, HTN, HLD presenting with chest pain.  Patient developed chest pain today while driving lasting approximately 1 to 2 hours. He noticed some palpitations at the time as well. In the past he has had occasional fleeting chest pain lasting less than 1 minute and resolving spontaneously. This however happens less than once per month. He has never had an episode as severe and as long lasting as the episode that occurred today. He went to his daughter's house and told her to take him to the ED.  Notably the patient lives with his daughter and has occasional episodes of confusion but is independent of ADL's and IADL's per the daughter's report. He does not do any formal exercise but he does get up and around frequently and keeps himself busy. He does not have any chest pain or pressure with exertion. He has no DOE, PND, orthopnea. He endorses a 20 lb weight loss over the last 6 months, attributing this to "not eating well."   In the ED the patient was found to have some SVT, however this was not documented by ECG. His symptoms did not seem to be related to his SVT per ED providers. An initial ECG did however reveal sinus tachycardia with diffuse ST depressions and  anteroseptal infarct pattern. Notably his chest pain had resolved by this time. Subsequent ECG revealed improvement in heart rate with sinus rhythm in the 80's and improvement in his ST segments with only residual nonspecific T wave changes.    Hospital Course     Consultants: none  Pt underwent lexiscan myoview. Results were reviewed with attending and thought to be low risk (see below). Pt denies further chest pain. Enzymes remained negative. Echo with stable grade 1 DD and no wall motion abnormality. Continue plavix. Pt has ASA allergy listed. Continue lisinopril and zocor.  DM A1c was 7.0% this admission Follow up with PCP  Chronic diastolic heart failure Continue home diuretic. Pt appears euvolemic.  Patient seen and examined by Dr. Stanford Breed today and was stable for discharge. All follow up has been arranged.  _____________  Discharge Vitals Blood pressure 139/76, pulse 63, temperature 98.1 F (36.7 C), temperature source Oral, resp. rate 18, height 5\' 11"  (1.803 m), weight 132 lb 0.9 oz (59.9 kg), SpO2 98 %.  Filed Weights   08/31/17 1258 09/01/17 1224  Weight: 140 lb (63.5 kg) 132 lb 0.9 oz (59.9 kg)    Labs & Radiologic Studies    CBC Recent Labs    08/31/17 1306 08/31/17 1432 09/01/17 0323  WBC 4.8  --  4.7  HGB 12.0* 13.3 10.2*  HCT 37.5* 39.0 32.5*  MCV 83.5  --  83.8  PLT 275  --  249  Basic Metabolic Panel Recent Labs    08/31/17 1306 08/31/17 1432 09/01/17 0323  NA 139 141 136  K 3.5 4.0 3.3*  CL 106 105 103  CO2 24  --  24  GLUCOSE 160* 154* 167*  BUN 9 10 12   CREATININE 0.96 0.90 0.88  CALCIUM 9.5  --  8.6*   Liver Function Tests No results for input(s): AST, ALT, ALKPHOS, BILITOT, PROT, ALBUMIN in the last 72 hours. No results for input(s): LIPASE, AMYLASE in the last 72 hours. Cardiac Enzymes Recent Labs    09/01/17 0322  TROPONINI <0.03   BNP Invalid input(s): POCBNP D-Dimer No results for input(s): DDIMER in the last 72  hours. Hemoglobin A1C Recent Labs    09/01/17 0323  HGBA1C 7.0*   Fasting Lipid Panel Recent Labs    09/01/17 0323  CHOL 136  HDL 53  LDLCALC 72  TRIG 57  CHOLHDL 2.6   Thyroid Function Tests No results for input(s): TSH, T4TOTAL, T3FREE, THYROIDAB in the last 72 hours.  Invalid input(s): FREET3 _____________  Dg Chest 1 View  Result Date: 08/31/2017 CLINICAL DATA:  Chest pain. EXAM: CHEST 1 VIEW COMPARISON:  09/19/2015 FINDINGS: Numerous leads and wires project over the chest. Midline trachea. Normal heart size. Tortuous thoracic aorta. No pleural effusion or pneumothorax. Bibasilar scarring or subsegmental atelectasis. Hyperinflation. Surgical clips at the gastroesophageal junction. IMPRESSION: Hyperinflation, without acute disease. Electronically Signed   By: Abigail Miyamoto M.D.   On: 08/31/2017 14:07   Nm Myocar Multi W/spect W/wall Motion / Ef  Result Date: 09/01/2017  There was no ST segment deviation noted during stress.  T wave inversion was noted during stress in the II, III, aVF, V3, V4, V5 and V6 leads.  This is a low risk study.  The left ventricular ejection fraction is normal (55-65%).  1. EF 60% with normal wall motion. 2. Primarily fixed medium-sized, moderate intensity basal to mid inferior and inferoseptal perfusion defect.  Given normal wall motion, this may be attenuation.  However, cannot totally rule out prior infarction.     Diagnostic Studies/Procedures    Myoview - Lexiscan 09/01/17:  There was no ST segment deviation noted during stress.  T wave inversion was noted during stress in the II, III, aVF, V3, V4, V5 and V6 leads.  This is a low risk study.  The left ventricular ejection fraction is normal (55-65%).   1. EF 60% with normal wall motion.  2. Primarily fixed medium-sized, moderate intensity basal to mid inferior and inferoseptal perfusion defect.  Given normal wall motion, this may be attenuation.  However, cannot totally rule out prior  infarction.    Echo 09/01/17: Study Conclusions - Left ventricle: The cavity size was normal. Systolic function was   normal. The estimated ejection fraction was in the range of 55%   to 60%. Wall motion was normal; there were no regional wall   motion abnormalities. Doppler parameters are consistent with   abnormal left ventricular relaxation (grade 1 diastolic   dysfunction). - Aortic valve: There was mild to moderate regurgitation directed   centrally in the LVOT. - Right atrium: The atrium was mildly dilated.   Disposition   Pt is being discharged home today in good condition.  Follow-up Plans & Appointments   Message sent to office, will call with appt.  Discharge Instructions    Diet - low sodium heart healthy   Complete by:  As directed    Increase activity slowly   Complete by:  As directed       Discharge Medications   Current Discharge Medication List    CONTINUE these medications which have NOT CHANGED   Details  ALPHAGAN P 0.1 % SOLN Place 1 drop into both eyes at bedtime.  Refills: 0    clopidogrel (PLAVIX) 75 MG tablet Take 75 mg by mouth daily.      finasteride (PROSCAR) 5 MG tablet Take 5 mg by mouth daily.      furosemide (LASIX) 40 MG tablet Take 40 mg by mouth daily.  Refills: 1    latanoprost (XALATAN) 0.005 % ophthalmic solution Place 1 drop into the left eye at bedtime.     lisinopril (PRINIVIL,ZESTRIL) 20 MG tablet take 1 tablet by mouth once daily Qty: 90 tablet, Refills: 2    metFORMIN (GLUCOPHAGE) 500 MG tablet Take 500 mg by mouth 2 (two) times daily. Refills: 1    simvastatin (ZOCOR) 40 MG tablet Take 40 mg by mouth daily.  Refills: 0    brimonidine-timolol (COMBIGAN) 0.2-0.5 % ophthalmic solution Place 1 drop into both eyes daily.    HYDROcodone-acetaminophen (NORCO/VICODIN) 5-325 MG tablet Take 1-2 tablets by mouth every 4 (four) hours as needed. Qty: 15 tablet, Refills: 0          Outstanding Labs/Studies   Follow up  with Dr. Lysbeth Penner office  Duration of Discharge Encounter   Greater than 30 minutes including physician time.  Signed, Tami Lin Maylea Soria PA-C 09/01/2017, 5:17 PM

## 2017-09-01 NOTE — Progress Notes (Signed)
Progress Note  Patient Name: Robert [name] Date of Encounter: 09/01/2017  Primary Cardiologist: Dr. Debara Pickett  Subjective   Pt seen in nuc med, no chest pain. Unable to walk on treadmill, converted to lexiscan.  Inpatient Medications    Scheduled Meds: . aspirin  324 mg Oral NOW   Or  . aspirin  300 mg Rectal NOW  . aspirin EC  81 mg Oral Daily  . brimonidine-timolol  1 drop Both Eyes Daily  . clopidogrel  75 mg Oral Daily  . finasteride  5 mg Oral Daily  . heparin  5,000 Units Subcutaneous Q8H  . latanoprost  1 drop Left Eye QHS  . lisinopril  20 mg Oral Daily  . metFORMIN  500 mg Oral BID WC  . regadenoson      . simvastatin  40 mg Oral Daily   Continuous Infusions:  PRN Meds: acetaminophen, nitroGLYCERIN, ondansetron (ZOFRAN) IV   Vital Signs    Vitals:   09/01/17 1100 09/01/17 1115 09/01/17 1130 09/01/17 1145  BP: (!) 157/77 (!) 149/77 (!) 137/100 123/62  Pulse: 63 (!) 59 68 65  Resp: 15 16 20 14   Temp:      TempSrc:      SpO2: 94% 98% 100% 99%  Weight:      Height:       No intake or output data in the 24 hours ending 09/01/17 1148 Filed Weights   08/31/17 1258  Weight: 140 lb (63.5 kg)     Physical Exam   General: Well developed, well nourished, male appearing in no acute distress. Head: Normocephalic, atraumatic.  Neck: Supple without bruits, JVD Lungs:  Resp regular and unlabored, diminished in bases Heart: RRR, S1, S2, no murmur; no rub. Abdomen: Soft, non-tender, non-distended with normoactive bowel sounds. No hepatomegaly. No rebound/guarding. No obvious abdominal masses. Extremities: No clubbing, cyanosis, no edema. Distal pedal pulses are faint bilaterally. Neuro: Alert and oriented X 3. Moves all extremities spontaneously. Psych: Normal affect.  Labs    Chemistry Recent Labs  Lab 08/31/17 1306 08/31/17 1432 09/01/17 0323  NA 139 141 136  K 3.5 4.0 3.3*  CL 106 105 103  CO2 24  --  24  GLUCOSE 160* 154* 167*  BUN 9 10 12     CREATININE 0.96 0.90 0.88  CALCIUM 9.5  --  8.6*  GFRNONAA >60  --  >60  GFRAA >60  --  >60  ANIONGAP 9  --  9     Hematology Recent Labs  Lab 08/31/17 1306 08/31/17 1432 09/01/17 0323  WBC 4.8  --  4.7  RBC 4.49  --  3.88*  HGB 12.0* 13.3 10.2*  HCT 37.5* 39.0 32.5*  MCV 83.5  --  83.8  MCH 26.7  --  26.3  MCHC 32.0  --  31.4  RDW 14.7  --  15.2  PLT 275  --  249    Cardiac Enzymes Recent Labs  Lab 09/01/17 0322  TROPONINI <0.03    Recent Labs  Lab 08/31/17 1337 08/31/17 1630  TROPIPOC 0.00 0.00     BNPNo results for input(s): BNP, PROBNP in the last 168 hours.   DDimer No results for input(s): DDIMER in the last 168 hours.   Radiology    Dg Chest 1 View  Result Date: 08/31/2017 CLINICAL DATA:  Chest pain. EXAM: CHEST 1 VIEW COMPARISON:  09/19/2015 FINDINGS: Numerous leads and wires project over the chest. Midline trachea. Normal heart size. Tortuous thoracic aorta. No pleural effusion  or pneumothorax. Bibasilar scarring or subsegmental atelectasis. Hyperinflation. Surgical clips at the gastroesophageal junction. IMPRESSION: Hyperinflation, without acute disease. Electronically Signed   By: Abigail Miyamoto M.D.   On: 08/31/2017 14:07     Telemetry    sinus - Personally Reviewed  ECG    No new tracings - Personally Reviewed   Cardiac Studies   Myoview 09/01/17: pending  Echo 09/01/17: pending  Echo 07/12/16: Study Conclusions - Left ventricle: The cavity size was normal. Systolic function was   normal. The estimated ejection fraction was in the range of 60%   to 65%. Wall motion was normal; there were no regional wall   motion abnormalities. Doppler parameters are consistent with   abnormal left ventricular relaxation (grade 1 diastolic   dysfunction). - Aortic valve: Trileaflet; mildly thickened, mildly calcified   leaflets. Transvalvular velocity was within the normal range.   There was no stenosis. There was moderate regurgitation. - Aortic  root: The aortic root was mildly dilated measuring 40 mm. - Ascending aorta: The ascending aorta was mildly dilated measuring   40 mm. - Mitral valve: Structurally normal valve. There was mild   regurgitation. - Right ventricle: Systolic function was normal. - Right atrium: The atrium was normal in size. - Tricuspid valve: There was mild regurgitation. - Pulmonic valve: There was trivial regurgitation. - Pulmonary arteries: Systolic pressure was moderately increased.   PA peak pressure: 38 mm Hg (S). - Inferior vena cava: The vessel was normal in size. - Pericardium, extracardiac: There was no pericardial effusion.  Patient Profile     83 y.o. male w/ history of CAD (IMI with RCA CTO on cath in 1980's, LCx stent placed 2004), AF (not on anticoagulation), DM, AAA s/p EVAR 2008, HTN, HLD presenting with chest pain.  Assessment & Plan    1. Chest pain - pt underwent nuclear stress test today, awaiting results - troponin x 3 negative - echo pending - pt denies chest pain today - will not start heparin drip yet   2. DM - A1c 7.0 - follow up with PCP    Signed, Tami Lin Duke , PA-C 11:48 AM 09/01/2017 Pager: [phone] As above, patient Seen and examined.He was admitted with atypical chest pain. Enzymes are negative. Initial electrocardiogram with significant ST depression but at higher heart rate. He has had no further symptoms. We will await results of stress nuclear study and echocardiogram. If unchanged and no ischemia patient can be discharged and follow-up with Dr. Debara Pickett. Will continue plavix, lisinopril and statin D1 Kirk Ruths, MD

## 2017-09-01 NOTE — Progress Notes (Signed)
Patient arrived to 4E room 19.  Telemetry monitor applied and CCMD notified.  Patient oriented to unit and room to include call light and phone.  Will continue to monitor.

## 2017-09-01 NOTE — Progress Notes (Addendum)
   Robert [name] presented for a nuclear stress test today.  No immediate complications.  Stress imaging is pending at this time.  Preliminary EKG findings may be listed in the chart, but the stress test result will not be finalized until perfusion imaging is complete.  Pt unable to safely walk on the treadmill. Converted to [name] study.  Tami Lin Treveon Bourcier, PA-C 09/01/2017, 9:47 AM

## 2017-09-01 NOTE — Progress Notes (Signed)
  Echocardiogram 2D Echocardiogram has been performed.  Robert [name] Robert [name] 09/01/2017, 4:04 PM

## 2017-09-01 NOTE — ED Notes (Signed)
Pt taken to Nuc Med for stress test. 

## 2017-09-06 ENCOUNTER — Other Ambulatory Visit: Payer: Self-pay | Admitting: Internal Medicine

## 2017-09-12 ENCOUNTER — Encounter: Payer: Self-pay | Admitting: Internal Medicine

## 2017-09-12 ENCOUNTER — Ambulatory Visit: Payer: Medicare HMO | Admitting: Internal Medicine

## 2017-09-12 VITALS — BP 140/88 | HR 63 | Ht 71.0 in | Wt 145.0 lb

## 2017-09-12 DIAGNOSIS — R079 Chest pain, unspecified: Secondary | ICD-10-CM

## 2017-09-12 DIAGNOSIS — I2119 ST elevation (STEMI) myocardial infarction involving other coronary artery of inferior wall: Secondary | ICD-10-CM | POA: Diagnosis not present

## 2017-09-12 DIAGNOSIS — I493 Ventricular premature depolarization: Secondary | ICD-10-CM

## 2017-09-12 DIAGNOSIS — I10 Essential (primary) hypertension: Secondary | ICD-10-CM | POA: Diagnosis not present

## 2017-09-12 NOTE — Progress Notes (Signed)
OFFICE NOTE  Chief Complaint:  Hospital follow-up  Primary Care Physician: Lavone Orn, MD  HPI:  Robert [name] is a 83 year-old gentleman with a history of remote inferior MI in the 21s. Catheterization in 1987 showed an occluded marginal branch of the RCA. He was treated medically at that time, but in 2004, he had worsening coronary disease and intervention in the circumflex with a Taxus stent. Since them, he has shown nonreversible defects on his Myoviews with a perfusion defect in the inferoseptal walls consistent with a prior scar. EF is 51%. He was recently in the hospital with a small-bowel obstruction and a CT angiogram was performed, which showed no pulmonary emboli. He also has a history of aortic stent graft, which was placed in 2008. Recently, he had felt funny, but could not describe it much better. He was found to be hypertensive and [name] increased his lisinopril to 30 mg daily and did not recommend any further treatment. Today he returns with persistently elevated blood pressure, 184/104. The blood pressure on a recheck was 150/80. Weight has been stable at around 162 to 163 pounds. At his last office visit I increased his lisinopril to 40 mg daily and his blood pressure now is much better controlled. He reports being asymptomatic and denies any chest pain worsening shortness of breath. His weight has been stable if not down a few pounds.  Mr. Taborda returns today for follow-up. He occasionally gets some shortness of breath which she relates to asthma, but does not have an inhaler. He denies any weight gain, in fact she's lost about 20 pounds. Is notable that his blood pressure is borderline low today at 102/68. He scheduled to see his primary care provider later this week for a full physical.  Mr. Volden was seen in the office today. Overall he is doing fairly well but continues to lose weight. His weight is now down to 141 pounds with a BMI of 19. He says he rarely  eats breakfast and eats very lightly throughout the day. There is been no concern for malignancy or other reason for his weight loss from his primary care provider. Because of his weight loss, however we've been reducing his medications. At his last office visit I talked about lowering his lisinopril further and this may be necessary as his blood pressure today is below 528 systolic. Heart rate actually is low as well in the 40s. Reviewing his EKG shows a trend toward bradycardia over the past several years. He reports some occasional dizziness and fatigue and had one episode of chest discomfort at rest which sounded somewhat atypical for angina.  Mr. Feldpausch returns today for follow-up of his monitor. He wore this for bradycardia. This demonstrated a sinus bradycardia with a heart rate as low as 39 mostly at night. He does not recall any symptoms related to this. He says some of his dizziness has improved. I asked to see if his eye doctor would stop his [place] eyedrops and he subsequently been switched to another type of eyedrop. Heart rate is somewhat higher today at 55, therefore this medication may have been affecting heart rate. He should certainly avoid any AV nodal blocking agents, but I do not see a clear indication for pacemaker at this time.  06/23/2016  Mr. Cundari is seen today back in follow-up. His wife reported that he's been having more frequent palpitations and made an appointment. EKG today shows sinus rhythm with frequent PVCs at 88. He's been  off of all beta blockers due to bradycardia with heart rate as low as the 30s in the past. Now these having breakthrough PVCs I'm concerned about either ischemic etiology, or related to prior scar with his MI in the past. He could also have a cardiomyopathy causing his symptoms. He does report some fatigue and chest discomfort as well. Options for management of his PVCs are limited given bradycardia in the past with beta blockers and he may be only a  candidate for either antiarrhythmic therapy or ablation if his PVCs are not ischemically mediated.  07/16/2016  Mr. Apgar seen today in follow-up. He now reports that he is asymptomatic with PVCs. Although he has some cardiomyopathy was thought that these could be related to PVCs he was not found to be having significant PVCs on EKG when he recently saw Dr. Curt Bears. He ordered a 48 hour Holter monitor which is yet to be placed. Follow-up with Dr. Curt Bears is in November.   01/21/2017  Mr. Severin is reporting some fatigue and occasional palpitations. He's been seeing  Dr. Curt Bears who did monitoring and felt that he had an insignificant number of PVCs. Based on this he has not been started on antiarrhythmic therapy. At times though he continues to have some palpitations which he feels fatigued with. Blood pressure is normal 126/66 today. He is on lisinopril but not on any AV nodal blocking medications. Heart rate at rest is 58.  09/12/2017  Mr. Poke returns today for follow-up.  He was recently hospitalized for chest pain.  He underwent a nuclear stress test which was negative for ischemia and showed normal LV function.  An echo also showed normal LVEF however there was mild to moderate aortic regurgitation and mild right atrial enlargement.  PMHx:  Past Medical History:  Diagnosis Date  . Atrial fibrillation ([place])   . CAD (coronary artery disease)    DES to Cfx  . Diabetes mellitus without complication ([place])   . History of nuclear stress test 02/2012   lexiscan; mild-mod perfusion defect in basal inferoseptal, mid inferoseptal apical anterior; no change from previous study, abnormal test   . Hyperlipidemia   . Hypertension   . Myocardial infarction ([place]) 1980s  . PAD (peripheral artery disease) ([place])    aortic stent graft 2008  . PUD (peptic ulcer disease)   . Small bowel obstruction ([place]) 10/2011  . Ulcer     Past Surgical History:  Procedure Laterality Date  . CARDIAC  CATHETERIZATION  1987   occluded marginal branch of RCA, treated medically - Dr Melvern Banker  . CORONARY ANGIOPLASTY WITH STENT PLACEMENT  03/2003   2.5x69mm Taxus DES to Cfx  . ESOPHAGOGASTRODUODENOSCOPY (EGD) with Balloon Dilation N/A 03/14/2014   Performed by Garlan Fair, MD at [facility]  . ESOPHAGOGASTRODUODENOSCOPY (EGD) WITH ESOPHAGEAL DILATION N/A 02/14/2014   Performed by Garlan Fair, MD at [facility]  . EYE SURGERY Right    cataract surgery  . GASTRECTOMY  50 yrs ago - 2 operations   had surgery at the age of 32 for the ulcer  . ILIAC ARTERY ANEURYSM REPAIR     Repaied by Dr. Kellie Simmering april 2009  . JOINT REPLACEMENT Left    hip replacement  . TRANSTHORACIC ECHOCARDIOGRAM  11/2009   EF=>55%, mild conc LVH, normal LV systolic function; mild MR; mild-mod TR with RV systolic pressure elevated at 30-63mmHg; mild-mod AV regurg; mild pulm valve regurg    FAMHx:  Family History  Problem Relation Age of  Onset  . Stroke Father   . Heart disease Father   . Hypertension Father   . Heart disease Daughter        Before age 78  . Diabetes Sister        DVT-Blood clots in vein    SOCHx:   reports that he quit smoking about 25 years ago. His smoking use included cigarettes. He quit after 0.50 years of use. he has never used smokeless tobacco. He reports that he does not drink alcohol or use drugs.  ALLERGIES:  Allergies  Allergen Reactions  . Asa W/Codeine [Aspirin-Codeine] Other (See Comments)    Irritates his water system    ROS: Pertinent items noted in HPI and remainder of comprehensive ROS otherwise negative.  HOME MEDS: Current Outpatient Medications  Medication Sig Dispense Refill  . ALPHAGAN P 0.1 % SOLN Place 1 drop into both eyes at bedtime.   0  . brimonidine-timolol (COMBIGAN) 0.2-0.5 % ophthalmic solution Place 1 drop into both eyes daily.    . clopidogrel (PLAVIX) 75 MG tablet Take 75 mg by mouth daily.      . finasteride (PROSCAR) 5 MG tablet Take 5 mg  by mouth daily.      . furosemide (LASIX) 40 MG tablet Take 40 mg by mouth daily.   1  . HYDROcodone-acetaminophen (NORCO/VICODIN) 5-325 MG tablet Take 1-2 tablets by mouth every 4 (four) hours as needed. 15 tablet 0  . latanoprost (XALATAN) 0.005 % ophthalmic solution Place 1 drop into the left eye at bedtime.     Marland Kitchen lisinopril (PRINIVIL,ZESTRIL) 20 MG tablet Take 20 mg daily by mouth.    . metFORMIN (GLUCOPHAGE) 500 MG tablet Take 500 mg by mouth 2 (two) times daily.  1  . simvastatin (ZOCOR) 40 MG tablet Take 40 mg by mouth daily.   0   No current facility-administered medications for this visit.     LABS/IMAGING: No results found for this or any previous visit (from the past 48 hour(s)). No results found.  VITALS: BP 140/88 (BP Location: Left Arm, Patient Position: Sitting, Cuff Size: Normal)   Pulse 63   Ht 5\' 11"  (1.803 m)   Wt 145 lb (65.8 kg)   BMI 20.22 kg/m   EXAM: General appearance: alert and no distress Neck: no carotid bruit, no JVD and thyroid not enlarged, symmetric, no tenderness/mass/nodules Lungs: clear to auscultation bilaterally Heart: regular rate and rhythm, S1, S2 normal and diastolic murmur: early diastolic 2/6, crescendo at lower left sternal border Abdomen: soft, non-tender; bowel sounds normal; no masses,  no organomegaly Extremities: extremities normal, atraumatic, no cyanosis or edema Pulses: 2+ and symmetric Skin: Skin color, texture, turgor normal. No rashes or lesions Neurologic: Grossly normal Psych: Pleasant  EKG: Deferred  ASSESSMENT: 1. Symptomatic PVCs 2. Fatigue 3. Coronary disease with a history of remote inferior MI 4. History PCI to circumflex and known occluded right coronary artery 5. Dyslipidemia - followed by Dr. Laurann Montana 6. Hypertension - at goal 7. History of stent graft for AAA 6.   Sinus bradycardia - asymptomatic, improved off b-blockers 7.   Mild to moderate aortic insufficiency  PLAN: 1.   Mr. Atienza was recently in  the hospital for chest pain however this completely resolved.  Nuclear stress testing was negative.  Echo shows mild to moderate AI.  We will have to be careful and watch for any symptoms related to that.  He does have a history of remote MI and there was a subtle fixed  inferior, could suggest a remote scar.  Occasionally gets symptomatic PVCs however had problems with bradycardia on beta-blockers therefore we have discontinued them.  Follow-up with me in 6 months or sooner as necessary.  Pixie Casino, MD, [facility] Attending Cardiologist [place] 09/12/2017, 9:00 AM

## 2017-09-12 NOTE — Patient Instructions (Signed)
Your physician wants you to follow-up in: 6 months with Dr. Hilty. You will receive a reminder letter in the mail two months in advance. If you don't receive a letter, please call our office to schedule the follow-up appointment.    

## 2017-09-20 DIAGNOSIS — M10072 Idiopathic gout, left ankle and foot: Secondary | ICD-10-CM | POA: Diagnosis not present

## 2017-10-11 DIAGNOSIS — H524 Presbyopia: Secondary | ICD-10-CM | POA: Diagnosis not present

## 2017-10-11 DIAGNOSIS — H1132 Conjunctival hemorrhage, left eye: Secondary | ICD-10-CM | POA: Diagnosis not present

## 2017-10-11 DIAGNOSIS — H35372 Puckering of macula, left eye: Secondary | ICD-10-CM | POA: Diagnosis not present

## 2017-10-11 DIAGNOSIS — H25811 Combined forms of age-related cataract, right eye: Secondary | ICD-10-CM | POA: Diagnosis not present

## 2017-10-11 DIAGNOSIS — Z961 Presence of intraocular lens: Secondary | ICD-10-CM | POA: Diagnosis not present

## 2017-10-11 DIAGNOSIS — H43811 Vitreous degeneration, right eye: Secondary | ICD-10-CM | POA: Diagnosis not present

## 2017-10-11 DIAGNOSIS — H52223 Regular astigmatism, bilateral: Secondary | ICD-10-CM | POA: Diagnosis not present

## 2017-10-12 DIAGNOSIS — M7918 Myalgia, other site: Secondary | ICD-10-CM | POA: Diagnosis not present

## 2017-10-12 DIAGNOSIS — I10 Essential (primary) hypertension: Secondary | ICD-10-CM | POA: Diagnosis not present

## 2017-10-12 DIAGNOSIS — I739 Peripheral vascular disease, unspecified: Secondary | ICD-10-CM | POA: Diagnosis not present

## 2017-10-12 DIAGNOSIS — I2510 Atherosclerotic heart disease of native coronary artery without angina pectoris: Secondary | ICD-10-CM | POA: Diagnosis not present

## 2017-10-12 DIAGNOSIS — E7800 Pure hypercholesterolemia, unspecified: Secondary | ICD-10-CM | POA: Diagnosis not present

## 2017-10-12 DIAGNOSIS — Z0000 Encounter for general adult medical examination without abnormal findings: Secondary | ICD-10-CM | POA: Diagnosis not present

## 2017-10-12 DIAGNOSIS — Z1389 Encounter for screening for other disorder: Secondary | ICD-10-CM | POA: Diagnosis not present

## 2017-10-12 DIAGNOSIS — E119 Type 2 diabetes mellitus without complications: Secondary | ICD-10-CM | POA: Diagnosis not present

## 2017-11-19 DIAGNOSIS — M5136 Other intervertebral disc degeneration, lumbar region: Secondary | ICD-10-CM | POA: Diagnosis not present

## 2017-11-30 DIAGNOSIS — M5136 Other intervertebral disc degeneration, lumbar region: Secondary | ICD-10-CM | POA: Diagnosis not present

## 2017-12-05 ENCOUNTER — Other Ambulatory Visit: Payer: Self-pay | Admitting: Physical Medicine and Rehabilitation

## 2018-03-21 DIAGNOSIS — H35033 Hypertensive retinopathy, bilateral: Secondary | ICD-10-CM | POA: Diagnosis not present

## 2018-03-21 DIAGNOSIS — H35372 Puckering of macula, left eye: Secondary | ICD-10-CM | POA: Diagnosis not present

## 2018-05-29 ENCOUNTER — Other Ambulatory Visit: Payer: Self-pay | Admitting: Internal Medicine

## 2018-05-29 ENCOUNTER — Other Ambulatory Visit: Payer: Self-pay | Admitting: Cardiology

## 2018-06-15 ENCOUNTER — Observation Stay (HOSPITAL_COMMUNITY)
Admission: EM | Admit: 2018-06-15 | Discharge: 2018-06-16 | Disposition: A | Discharge status: Home / Self Care | Payer: PPO | Attending: Internal Medicine | Admitting: Internal Medicine

## 2018-06-15 ENCOUNTER — Other Ambulatory Visit: Payer: Self-pay

## 2018-06-15 ENCOUNTER — Encounter (HOSPITAL_COMMUNITY): Payer: Self-pay

## 2018-06-15 ENCOUNTER — Emergency Department (HOSPITAL_COMMUNITY): Payer: PPO

## 2018-06-15 DIAGNOSIS — I482 Chronic atrial fibrillation: Secondary | ICD-10-CM | POA: Insufficient documentation

## 2018-06-15 DIAGNOSIS — S0083XA Contusion of other part of head, initial encounter: Secondary | ICD-10-CM | POA: Diagnosis not present

## 2018-06-15 DIAGNOSIS — W109XXA Fall (on) (from) unspecified stairs and steps, initial encounter: Secondary | ICD-10-CM | POA: Insufficient documentation

## 2018-06-15 DIAGNOSIS — Z79899 Other long term (current) drug therapy: Secondary | ICD-10-CM | POA: Diagnosis not present

## 2018-06-15 DIAGNOSIS — N400 Enlarged prostate without lower urinary tract symptoms: Secondary | ICD-10-CM

## 2018-06-15 DIAGNOSIS — E1151 Type 2 diabetes mellitus with diabetic peripheral angiopathy without gangrene: Secondary | ICD-10-CM | POA: Diagnosis not present

## 2018-06-15 DIAGNOSIS — I25119 Atherosclerotic heart disease of native coronary artery with unspecified angina pectoris: Secondary | ICD-10-CM | POA: Diagnosis not present

## 2018-06-15 DIAGNOSIS — W19XXXA Unspecified fall, initial encounter: Secondary | ICD-10-CM

## 2018-06-15 DIAGNOSIS — Z8711 Personal history of peptic ulcer disease: Secondary | ICD-10-CM | POA: Insufficient documentation

## 2018-06-15 DIAGNOSIS — R9431 Abnormal electrocardiogram [ECG] [EKG]: Secondary | ICD-10-CM | POA: Diagnosis not present

## 2018-06-15 DIAGNOSIS — I714 Abdominal aortic aneurysm, without rupture, unspecified: Secondary | ICD-10-CM | POA: Diagnosis present

## 2018-06-15 DIAGNOSIS — Z7984 Long term (current) use of oral hypoglycemic drugs: Secondary | ICD-10-CM | POA: Diagnosis not present

## 2018-06-15 DIAGNOSIS — M19032 Primary osteoarthritis, left wrist: Secondary | ICD-10-CM | POA: Diagnosis not present

## 2018-06-15 DIAGNOSIS — Z87891 Personal history of nicotine dependence: Secondary | ICD-10-CM | POA: Diagnosis not present

## 2018-06-15 DIAGNOSIS — I2583 Coronary atherosclerosis due to lipid rich plaque: Secondary | ICD-10-CM

## 2018-06-15 DIAGNOSIS — R42 Dizziness and giddiness: Secondary | ICD-10-CM | POA: Diagnosis not present

## 2018-06-15 DIAGNOSIS — Z885 Allergy status to narcotic agent status: Secondary | ICD-10-CM | POA: Diagnosis not present

## 2018-06-15 DIAGNOSIS — I2510 Atherosclerotic heart disease of native coronary artery without angina pectoris: Secondary | ICD-10-CM

## 2018-06-15 DIAGNOSIS — S6992XA Unspecified injury of left wrist, hand and finger(s), initial encounter: Secondary | ICD-10-CM | POA: Diagnosis not present

## 2018-06-15 DIAGNOSIS — E876 Hypokalemia: Secondary | ICD-10-CM | POA: Diagnosis not present

## 2018-06-15 DIAGNOSIS — S299XXA Unspecified injury of thorax, initial encounter: Secondary | ICD-10-CM | POA: Diagnosis not present

## 2018-06-15 DIAGNOSIS — S0990XA Unspecified injury of head, initial encounter: Secondary | ICD-10-CM | POA: Diagnosis not present

## 2018-06-15 DIAGNOSIS — R001 Bradycardia, unspecified: Secondary | ICD-10-CM | POA: Diagnosis not present

## 2018-06-15 DIAGNOSIS — Z955 Presence of coronary angioplasty implant and graft: Secondary | ICD-10-CM | POA: Insufficient documentation

## 2018-06-15 DIAGNOSIS — Y9301 Activity, walking, marching and hiking: Secondary | ICD-10-CM | POA: Insufficient documentation

## 2018-06-15 DIAGNOSIS — Z7902 Long term (current) use of antithrombotics/antiplatelets: Secondary | ICD-10-CM | POA: Insufficient documentation

## 2018-06-15 DIAGNOSIS — D649 Anemia, unspecified: Secondary | ICD-10-CM | POA: Diagnosis not present

## 2018-06-15 DIAGNOSIS — D509 Iron deficiency anemia, unspecified: Secondary | ICD-10-CM | POA: Diagnosis not present

## 2018-06-15 DIAGNOSIS — S63502A Unspecified sprain of left wrist, initial encounter: Secondary | ICD-10-CM | POA: Diagnosis not present

## 2018-06-15 DIAGNOSIS — S61412A Laceration without foreign body of left hand, initial encounter: Secondary | ICD-10-CM

## 2018-06-15 DIAGNOSIS — Z886 Allergy status to analgesic agent status: Secondary | ICD-10-CM | POA: Insufficient documentation

## 2018-06-15 DIAGNOSIS — E785 Hyperlipidemia, unspecified: Secondary | ICD-10-CM | POA: Diagnosis not present

## 2018-06-15 DIAGNOSIS — I10 Essential (primary) hypertension: Secondary | ICD-10-CM | POA: Insufficient documentation

## 2018-06-15 DIAGNOSIS — Z23 Encounter for immunization: Secondary | ICD-10-CM | POA: Insufficient documentation

## 2018-06-15 DIAGNOSIS — Y92009 Unspecified place in unspecified non-institutional (private) residence as the place of occurrence of the external cause: Secondary | ICD-10-CM | POA: Diagnosis not present

## 2018-06-15 DIAGNOSIS — M25532 Pain in left wrist: Secondary | ICD-10-CM | POA: Diagnosis not present

## 2018-06-15 DIAGNOSIS — R0789 Other chest pain: Secondary | ICD-10-CM | POA: Diagnosis not present

## 2018-06-15 DIAGNOSIS — I252 Old myocardial infarction: Secondary | ICD-10-CM | POA: Diagnosis not present

## 2018-06-15 HISTORY — DX: Unspecified fall, initial encounter: W19.XXXA

## 2018-06-15 HISTORY — DX: Unspecified place in unspecified non-institutional (private) residence as the place of occurrence of the external cause: Y92.009

## 2018-06-15 HISTORY — DX: Unspecified fall, initial encounter: Y92.009

## 2018-06-15 LAB — CBC
HEMATOCRIT: 29.1 % — AB (ref 39.0–52.0)
HEMOGLOBIN: 8.4 g/dL — AB (ref 13.0–17.0)
MCH: 21 pg — ABNORMAL LOW (ref 26.0–34.0)
MCHC: 28.9 g/dL — AB (ref 30.0–36.0)
MCV: 72.8 fL — ABNORMAL LOW (ref 78.0–100.0)
Platelets: 359 10*3/uL (ref 150–400)
RBC: 4 MIL/uL — AB (ref 4.22–5.81)
RDW: 18.6 % — ABNORMAL HIGH (ref 11.5–15.5)
WBC: 3.6 10*3/uL — ABNORMAL LOW (ref 4.0–10.5)

## 2018-06-15 LAB — BASIC METABOLIC PANEL
ANION GAP: 12 (ref 5–15)
BUN: <5 mg/dL — ABNORMAL LOW (ref 8–23)
CO2: 25 mmol/L (ref 22–32)
Calcium: 8.6 mg/dL — ABNORMAL LOW (ref 8.9–10.3)
Chloride: 101 mmol/L (ref 98–111)
Creatinine, Ser: 0.76 mg/dL (ref 0.61–1.24)
GFR calc Af Amer: >60 mL/min (ref >60)
GFR calc non Af Amer: >60 mL/min (ref >60)
GLUCOSE: 190 mg/dL — AB (ref 70–99)
Potassium: 2.7 mmol/L — CL (ref 3.5–5.1)
Sodium: 138 mmol/L (ref 135–145)

## 2018-06-15 LAB — I-STAT TROPONIN, ED: Troponin i, poc: 0 ng/mL (ref 0.00–0.08)

## 2018-06-15 LAB — TROPONIN I: Troponin I: <0.03 ng/mL (ref <0.03)

## 2018-06-15 LAB — GLUCOSE, CAPILLARY
Glucose-Capillary: 193 mg/dL — ABNORMAL HIGH (ref 70–99)
Glucose-Capillary: 155 mg/dL — ABNORMAL HIGH (ref 70–99)

## 2018-06-15 LAB — POC OCCULT BLOOD, ED: Fecal Occult Bld: NEGATIVE

## 2018-06-15 LAB — TSH: TSH: 0.967 u[IU]/mL (ref 0.350–4.500)

## 2018-06-15 LAB — MAGNESIUM: Magnesium: 1.5 mg/dL — ABNORMAL LOW (ref 1.7–2.4)

## 2018-06-15 MED ORDER — LISINOPRIL 20 MG PO TABS
20.0000 mg | ORAL_TABLET | Freq: Every day | ORAL | Status: DC
  Administered 2018-06-16: 20 mg via ORAL
  Filled 2018-06-15: qty 1

## 2018-06-15 MED ORDER — ACETAMINOPHEN 650 MG RE SUPP: 650.0000 mg | Freq: Four times a day (QID) | RECTAL | Status: DC | PRN

## 2018-06-15 MED ORDER — ONDANSETRON HCL 4 MG PO TABS: 4.0000 mg | ORAL_TABLET | Freq: Four times a day (QID) | ORAL | Status: DC | PRN

## 2018-06-15 MED ORDER — ONDANSETRON HCL 4 MG/2ML IJ SOLN: 4.0000 mg | Freq: Four times a day (QID) | INTRAMUSCULAR | Status: DC | PRN

## 2018-06-15 MED ORDER — FUROSEMIDE 40 MG PO TABS
40.0000 mg | ORAL_TABLET | Freq: Every day | ORAL | Status: DC
  Administered 2018-06-16: 40 mg via ORAL
  Filled 2018-06-15: qty 1

## 2018-06-15 MED ORDER — POTASSIUM CHLORIDE CRYS ER 20 MEQ PO TBCR
40.0000 meq | EXTENDED_RELEASE_TABLET | Freq: Once | ORAL | Status: AC
  Administered 2018-06-15: 40 meq via ORAL
  Filled 2018-06-15: qty 2

## 2018-06-15 MED ORDER — CLOPIDOGREL BISULFATE 75 MG PO TABS
75.0000 mg | ORAL_TABLET | Freq: Every day | ORAL | Status: DC
  Administered 2018-06-16: 75 mg via ORAL
  Filled 2018-06-15: qty 1

## 2018-06-15 MED ORDER — INSULIN ASPART 100 UNIT/ML ~~LOC~~ SOLN: 0.0000 [IU] | Freq: Every day | SUBCUTANEOUS | Status: DC

## 2018-06-15 MED ORDER — TRAMADOL HCL 50 MG PO TABS: 50.0000 mg | ORAL_TABLET | Freq: Four times a day (QID) | ORAL | Status: DC | PRN

## 2018-06-15 MED ORDER — POTASSIUM CHLORIDE 10 MEQ/100ML IV SOLN
10.0000 meq | INTRAVENOUS | Status: AC
  Administered 2018-06-15 – 2018-06-16 (×3): 10 meq via INTRAVENOUS
  Filled 2018-06-15 (×2): qty 100

## 2018-06-15 MED ORDER — POTASSIUM CHLORIDE 10 MEQ/100ML IV SOLN
10.0000 meq | Freq: Once | INTRAVENOUS | Status: AC
  Administered 2018-06-15: 10 meq via INTRAVENOUS
  Filled 2018-06-15: qty 100

## 2018-06-15 MED ORDER — ENSURE ENLIVE PO LIQD
237.0000 mL | Freq: Two times a day (BID) | ORAL | Status: DC
  Administered 2018-06-15 – 2018-06-16 (×2): 237 mL via ORAL

## 2018-06-15 MED ORDER — ALBUTEROL SULFATE (2.5 MG/3ML) 0.083% IN NEBU: 2.5000 mg | INHALATION_SOLUTION | Freq: Four times a day (QID) | RESPIRATORY_TRACT | Status: DC | PRN

## 2018-06-15 MED ORDER — SIMVASTATIN 40 MG PO TABS
40.0000 mg | ORAL_TABLET | Freq: Every day | ORAL | Status: DC
  Administered 2018-06-15 – 2018-06-16 (×2): 40 mg via ORAL
  Filled 2018-06-15 (×2): qty 1

## 2018-06-15 MED ORDER — ENOXAPARIN SODIUM 40 MG/0.4ML ~~LOC~~ SOLN
40.0000 mg | SUBCUTANEOUS | Status: DC
  Administered 2018-06-15 – 2018-06-16 (×2): 40 mg via SUBCUTANEOUS
  Filled 2018-06-15 (×2): qty 0.4

## 2018-06-15 MED ORDER — SODIUM CHLORIDE 0.9% FLUSH
3.0000 mL | Freq: Two times a day (BID) | INTRAVENOUS | Status: DC
  Administered 2018-06-16 (×2): 3 mL via INTRAVENOUS

## 2018-06-15 MED ORDER — PNEUMOCOCCAL VAC POLYVALENT 25 MCG/0.5ML IJ INJ
0.5000 mL | INJECTION | INTRAMUSCULAR | Status: AC
  Administered 2018-06-16: 0.5 mL via INTRAMUSCULAR
  Filled 2018-06-15: qty 0.5

## 2018-06-15 MED ORDER — INSULIN ASPART 100 UNIT/ML ~~LOC~~ SOLN
0.0000 [IU] | Freq: Three times a day (TID) | SUBCUTANEOUS | Status: DC
  Administered 2018-06-15 – 2018-06-16 (×2): 2 [IU] via SUBCUTANEOUS
  Administered 2018-06-16: 1 [IU] via SUBCUTANEOUS

## 2018-06-15 MED ORDER — ACETAMINOPHEN 325 MG PO TABS: 650.0000 mg | ORAL_TABLET | Freq: Four times a day (QID) | ORAL | Status: DC | PRN

## 2018-06-15 MED ORDER — FINASTERIDE 5 MG PO TABS
5.0000 mg | ORAL_TABLET | Freq: Every day | ORAL | Status: DC
  Administered 2018-06-16: 5 mg via ORAL
  Filled 2018-06-15: qty 1

## 2018-06-15 MED ORDER — LATANOPROST 0.005 % OP SOLN
1.0000 [drp] | Freq: Every day | OPHTHALMIC | Status: DC
  Administered 2018-06-15: 1 [drp] via OPHTHALMIC
  Filled 2018-06-15: qty 2.5

## 2018-06-15 MED ORDER — DIPHENHYDRAMINE HCL 50 MG/ML IJ SOLN
12.5000 mg | Freq: Once | INTRAMUSCULAR | Status: AC
  Administered 2018-06-15: 12.5 mg via INTRAVENOUS
  Filled 2018-06-15: qty 1

## 2018-06-15 MED ORDER — BRIMONIDINE TARTRATE 0.15 % OP SOLN
1.0000 [drp] | Freq: Every day | OPHTHALMIC | Status: DC
  Administered 2018-06-15: 1 [drp] via OPHTHALMIC
  Filled 2018-06-15: qty 5

## 2018-06-15 MED ORDER — MORPHINE SULFATE (PF) 4 MG/ML IV SOLN
4.0000 mg | Freq: Once | INTRAVENOUS | Status: AC
  Administered 2018-06-15: 4 mg via INTRAVENOUS
  Filled 2018-06-15: qty 1

## 2018-06-15 MED ORDER — MAGNESIUM SULFATE 2 GM/50ML IV SOLN
2.0000 g | Freq: Once | INTRAVENOUS | Status: AC
  Administered 2018-06-15: 2 g via INTRAVENOUS
  Filled 2018-06-15: qty 50

## 2018-06-15 MED ORDER — HYDROCODONE-ACETAMINOPHEN 5-325 MG PO TABS
1.0000 | ORAL_TABLET | Freq: Four times a day (QID) | ORAL | Status: DC | PRN
  Administered 2018-06-16 (×2): 1 via ORAL
  Filled 2018-06-15 (×2): qty 1

## 2018-06-15 NOTE — ED Provider Notes (Signed)
[facility] EMERGENCY DEPARTMENT Provider Note   CSN: [id number] Arrival date & time: 06/15/18  8315     History   Chief Complaint Chief Complaint  Patient presents with  . Fall    HPI Robert [name] is a 84 y.o. male.  The history is provided by the patient, medical records and a significant other. No language interpreter was used.  Fall      84 year old male with hx of afib, CAD with DES currently on Plavix, DM, PAD, presenting to the ER from home accompany by family member for evaluation of a fall.  Patient mention he was walking down the step this morning, tripped and fell.  He did hit his head but denies any loss of consciousness.  He was unable to tell me if he had any precipitating some symptoms prior to the fall.  He complains of pain to his left forehead and left side of chest from the fall.  Pain is moderate worse with palpation.  He did report some dizziness initially that has since resolved.  He denies any shortness of breath, abdominal pain, back or leg pain.  He does complain of pain to his left hand where he suffered a small cut.  He is on blood thinner medication.  Daughter have not noticed any changes leading up to this event.  No recent medication changes. No hx of alcohol or tobacco abuse.   The fall was unwitnessed.  Pt is a poor historian.   Past Medical History:  Diagnosis Date  . Atrial fibrillation ([place])   . CAD (coronary artery disease)    DES to Cfx  . Diabetes mellitus without complication ([place])   . History of nuclear stress test 02/2012   lexiscan; mild-mod perfusion defect in basal inferoseptal, mid inferoseptal apical anterior; no change from previous study, abnormal test   . Hyperlipidemia   . Hypertension   . Myocardial infarction ([place]) 1980s  . PAD (peripheral artery disease) ([place])    aortic stent graft 2008  . PUD (peptic ulcer disease)   . Small bowel obstruction ([place]) 10/2011  . Ulcer     Patient Active Problem List   Diagnosis Date Noted  . Chest pain 08/31/2017  . Other fatigue 06/23/2016  . Frequent PVCs 06/23/2016  . Symptomatic bradycardia 05/20/2015  . Pain of left lower extremity-Left Hip/Leg 06/04/2014  . Numbness-Bilat Leg 06/04/2014  . Weakness of both legs 06/04/2014  . Aftercare following surgery of the circulatory system, [place] 10/02/2013  . AAA (abdominal aortic aneurysm) ([place]) 07/18/2012  . 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 Renal mass, left 11/07/2011  . SBO (small bowel obstruction) ([place]) 11/07/2011  . Hyperkalemia 11/07/2011  . CAD (coronary artery disease)   . Inferior MI ([place])   . Hyperlipidemia   . Essential hypertension   . Ulcer-s/p Gastrectomy 40 yr ago     Past Surgical History:  Procedure Laterality Date  . CARDIAC CATHETERIZATION  1987   occluded marginal branch of RCA, treated medically - Dr Melvern Banker  . CORONARY ANGIOPLASTY WITH STENT PLACEMENT  03/2003   2.5x69mm Taxus DES to Cfx  . ESOPHAGOGASTRODUODENOSCOPY N/A 03/14/2014   Procedure: ESOPHAGOGASTRODUODENOSCOPY (EGD) with Balloon Dilation;  Surgeon: Garlan Fair, MD;  Location: [facility];  Service: Endoscopy;  Laterality: N/A;  . EYE SURGERY Right    cataract surgery  . GASTRECTOMY  50 yrs ago - 2 operations   had surgery at the age of 29 for the ulcer  .  ILIAC ARTERY ANEURYSM REPAIR     Repaied by Dr. Kellie Simmering april 2009  . JOINT REPLACEMENT Left    hip replacement  . TRANSTHORACIC ECHOCARDIOGRAM  11/2009   EF=>55%, mild conc LVH, normal LV systolic function; mild MR; mild-mod TR with RV systolic pressure elevated at 30-42mmHg; mild-mod AV regurg; mild pulm valve regurg        Home Medications    Prior to Admission medications   Medication Sig Start Date End Date Taking? Authorizing Provider  ALPHAGAN P 0.1 % SOLN Place 1 drop into both eyes at bedtime.  09/16/15   [provider]  brimonidine-timolol (COMBIGAN) 0.2-0.5 % ophthalmic solution Place 1 drop into both eyes daily.     [provider]  clopidogrel (PLAVIX) 75 MG tablet Take 75 mg by mouth daily.      [provider]  finasteride (PROSCAR) 5 MG tablet Take 5 mg by mouth daily.      [provider]  furosemide (LASIX) 40 MG tablet Take 40 mg by mouth daily.  08/29/14   [provider]  HYDROcodone-acetaminophen (NORCO/VICODIN) 5-325 MG tablet Take 1-2 tablets by mouth every 4 (four) hours as needed. 04/11/17   Malvin Johns, MD  latanoprost (XALATAN) 0.005 % ophthalmic solution Place 1 drop into the left eye at bedtime.     [provider]  lisinopril (PRINIVIL,ZESTRIL) 20 MG tablet TAKE 1 TABLET BY MOUTH EVERY DAY 05/29/18   Leonie Man, MD  metFORMIN (GLUCOPHAGE) 500 MG tablet Take 500 mg by mouth 2 (two) times daily. 09/04/15   [provider]  simvastatin (ZOCOR) 40 MG tablet Take 40 mg by mouth daily.  08/29/14   [provider]    Family History Family History  Problem Relation Age of Onset  . Stroke Father   . Heart disease Father   . Hypertension Father   . Heart disease Daughter        Before age 57  . Diabetes Sister        DVT-Blood clots in vein    Social History Social History   Tobacco Use  . Smoking status: Former Smoker    Years: 0.50    Types: Cigarettes    Last attempt to quit: 07/18/1992    Years since quitting: 25.9  . Smokeless tobacco: Never Used  . Tobacco comment: quit around HA time  Substance Use Topics  . Alcohol use: No  . Drug use: No     Allergies   Asa w/codeine [aspirin-codeine]   Review of Systems Review of Systems  Unable to perform ROS: Age     Physical Exam Updated Vital Signs BP 122/73 (BP Location: Right Arm)   Pulse (!) 55   Temp 98.1 F (36.7 C) (Oral)   Resp 20   SpO2 99%   Physical Exam  Constitutional: He appears well-developed and well-nourished. No distress.  Elderly male, non toxic in appearance, in no acute discomfort.  HENT:  Head: Normocephalic.  A small  hematoma noted to L forehead, tender to palpation without crepitus. No hemotympanum, no septal hematoma, no malocclusion, no midface tenderness  Eyes: Pupils are equal, round, and reactive to light. Conjunctivae and EOM are normal.  Neck: Normal range of motion. Neck supple.  No cervical midline spine tenderness.  Cardiovascular:  Bradycardic without M/R/G  Pulmonary/Chest: Effort normal and breath sounds normal. He exhibits tenderness (tenderness to left anterolateral lower chest on palpation, without crepitus, emphysema or overlying skin changes).  Abdominal: Soft. Bowel sounds  are normal. He exhibits no distension. There is no tenderness.  Genitourinary:  Genitourinary Comments: Chaperone present during exam.  Normal rectal tone, no stool impaction in rectal vault, no mass, normal color stool on glove.   Musculoskeletal: He exhibits tenderness (L hand: a small superficial 1cm lac noted to the webspace between 1st-2nd finger, mild tendernss to palpation, no deformity.).  Neurological: He is alert.  Skin: No rash noted.  Psychiatric: He has a normal mood and affect.  Nursing note and vitals reviewed.    ED Treatments / Results  Labs (all labs ordered are listed, but only abnormal results are displayed) Labs Reviewed  BASIC METABOLIC PANEL - Abnormal; Notable for the following components:      Result Value   Potassium 2.7 (*)    Glucose, Bld 190 (*)    BUN <5 (*)    Calcium 8.6 (*)    All other components within normal limits  CBC - Abnormal; Notable for the following components:   WBC 3.6 (*)    RBC 4.00 (*)    Hemoglobin 8.4 (*)    HCT 29.1 (*)    MCV 72.8 (*)    MCH 21.0 (*)    MCHC 28.9 (*)    RDW 18.6 (*)    All other components within normal limits  I-STAT TROPONIN, ED    EKG EKG Interpretation  Date/Time:  Thursday June 15 2018 09:45:01 EDT Ventricular Rate:  58 PR Interval:  218 QRS Duration: 92 QT Interval:  448 QTC Calculation: 439 R Axis:   25 Text  Interpretation:  Sinus bradycardia with 1st degree A-V block Incomplete right bundle branch block Septal infarct , age undetermined ST & T wave abnormality, consider inferolateral ischemia Abnormal ECG ST changes noted from prior. No STEMI.  Confirmed by Nanda Quinton [phone]) on 06/15/2018 9:56:23 AM   Radiology Dg Chest 2 View  Result Date: 06/15/2018 CLINICAL DATA:  Status post fall, dizziness EXAM: CHEST - 2 VIEW COMPARISON:  08/31/2017 FINDINGS: The lungs are hyperinflated likely secondary to COPD. There is no focal parenchymal opacity. There is no pleural effusion or pneumothorax. The heart and mediastinal contours are unremarkable. The osseous structures are unremarkable. IMPRESSION: No active cardiopulmonary disease. Electronically Signed   By: Kathreen Devoid   On: 06/15/2018 10:06   Dg Wrist Complete Left  Result Date: 06/15/2018 CLINICAL DATA:  Fall.  Wrist pain. EXAM: LEFT WRIST - COMPLETE 3+ VIEW COMPARISON:  None. FINDINGS: Advanced arthritic changes in the left wrist. Large subchondral cyst in the distal radius. Advanced degenerative changes at the 1st carpometacarpal joint. No acute bony abnormality. Specifically, no fracture, subluxation, or dislocation. IMPRESSION: Advanced arthritic changes in the left wrist. No acute bony abnormality. Electronically Signed   By: Rolm Baptise M.D.   On: 06/15/2018 10:06   Ct Head Wo Contrast  Result Date: 06/15/2018 CLINICAL DATA:  Fall, head trauma, on anticoagulation. EXAM: CT HEAD WITHOUT CONTRAST TECHNIQUE: Contiguous axial images were obtained from the base of the skull through the vertex without intravenous contrast. COMPARISON:  01/08/2014 FINDINGS: Brain: There is atrophy and chronic small vessel disease changes. Calcifications in the basal ganglia, unchanged. Vascular: No hyperdense vessel or unexpected calcification. Skull: No acute calvarial abnormality. Sinuses/Orbits: Visualized paranasal sinuses and mastoids clear. Orbital soft tissues  unremarkable. Other: Soft tissue swelling over the left forehead. IMPRESSION: No acute intracranial abnormality. Atrophy, chronic microvascular disease. Electronically Signed   By: Rolm Baptise M.D.   On: 06/15/2018 10:32    Procedures Procedures (  including critical care time)  Medications Ordered in ED Medications - No data to display   Initial Impression / Assessment and Plan / ED Course  I have reviewed the triage vital signs and the nursing notes.  Pertinent labs & imaging results that were available during my care of the patient were reviewed by me and considered in my medical decision making (see chart for details).     BP (!) 176/87   Pulse (!) 55   Temp 98.1 F (36.7 C) (Oral)   Resp 12   SpO2 97%    Final Clinical Impressions(s) / ED Diagnoses   Final diagnoses:  Fall, initial encounter  Contusion of forehead, initial encounter  Hypokalemia  Laceration of left hand without foreign body, initial encounter  Anemia, unspecified type    ED Discharge Orders    None     BP (!) 167/78 (BP Location: Right Arm)   Pulse (!) 53   Temp 98.1 F (36.7 C) (Oral)   Resp 16   SpO2 100%   Pt here for evaluation of a fall.  He  Has a small hematoma to L forehead, and a small superficial lac to L hand not requiring lac repair. He also have L chest wall tenderness. He's on Plavix. Head CT, CXR, and L wrist xray without acute finding.  He's in pain, morphine ordered.  Care discussed with Dr. Laverta Baltimore.  Labs remarkable for a drop in the Hgb of 8.4 from 10.2 nine months ago.  No obvious source of blood loss.  Hemoccult negative.  Pt also have hypokalemia with K+ 2.7. Will order replacement.  EKG showing ST inversion in inferior and lateral leads, which is new compared to prior.  Normal initial trop.    12:52 PM In the setting of an unwitnessed fall, with abnormal EKG, hypokalemia, anemia, and having chest wall pain I appreciate consultation from [facility] hospitalist, Dr. Tamala Julian who agrees  to admit patient for observation.  Patient and daughter agrees with plan.    Domenic Moras, PA-C 06/15/18 1253    Long, Wonda Olds, MD 06/15/18 2026

## 2018-06-15 NOTE — Care Management Note (Signed)
Case Management Note  Patient Details  Name: Robert [name] MRN: [id number] Date of Birth: 03/30/1932  Subjective/Objective:        Pt s/p fall, hx of  L wrist OA, a-fib, chest pain from MI, CAD, PUD, PVC's, SBO, PAD, PN, DM.       Scarlett Presto (Daughter) Lupita Leash [facility])    [phone]     MBW:GYKZ Laurann Montana  Action/Plan: Home health PT;Supervision for mobility/OOB, per PT's recommendations. Transition of care in process... NCM following....  Expected Discharge Date:                  Expected Discharge Plan:  [facility]  In-House Referral:     Discharge planning Services  CM Consult  Post Acute Care Choice:    Choice offered to:     DME Arranged:   rolling walker, NCM to arrange prior to d/c DME Agency:     HH Arranged:    HH Agency:     Status of Service:  In process, will continue to follow  If discussed at Long Length of Stay Meetings, dates discussed:    Additional Comments:  Sharin Mons, RN 06/15/2018, 7:08 PM

## 2018-06-15 NOTE — ED Notes (Addendum)
Per MD, Pt ok to eat. Pt given [place] sandwich / apple sauce with iced water. One family member at bedside at this time.  Phlebotomy also at bedside at this time.

## 2018-06-15 NOTE — ED Notes (Signed)
ED TO INPATIENT HANDOFF REPORT  Name/Age/Gender Robert [name] 84 y.o. male  Code Status    Code Status Orders  (From admission, onward)         Start     Ordered   06/15/18 1320  Full code  Continuous     06/15/18 1323        Code Status History    Date Active Date Inactive Code Status Order ID Comments User Context   08/31/2017 2158 09/01/2017 2215 Full Code [id number]  Marcie Mowers, MD ED      Home/SNF/Other Home  Chief Complaint fall  Level of Care/Admitting Diagnosis ED Disposition    ED Disposition Condition [place]: [facility] [100100]  Level of Care: Telemetry [5]  Diagnosis: Fall [290176]  Admitting Physician: Norval Morton [[id number]]  Attending Physician: Norval Morton [[id number]]  PT Class (Do Not Modify): Observation [104]  PT Acc Code (Do Not Modify): Observation [10022]       Medical History Past Medical History:  Diagnosis Date  . Atrial fibrillation ([place])   . CAD (coronary artery disease)    DES to Cfx  . Diabetes mellitus without complication ([place])   . History of nuclear stress test 02/2012   lexiscan; mild-mod perfusion defect in basal inferoseptal, mid inferoseptal apical anterior; no change from previous study, abnormal test   . Hyperlipidemia   . Hypertension   . Myocardial infarction ([place]) 1980s  . PAD (peripheral artery disease) ([place])    aortic stent graft 2008  . PUD (peptic ulcer disease)   . Small bowel obstruction ([place]) 10/2011  . Ulcer     Allergies Allergies  Allergen Reactions  . [place]a W/Codeine [Aspirin-Codeine] Other (See Comments)    Irritates his water system    IV Location/Drains/Wounds Patient Lines/Drains/Airways Status   Active Line/Drains/Airways    Name:   Placement date:   Placement time:   Site:   Days:   Peripheral IV 06/15/18 Right Antecubital   06/15/18    1120    Antecubital   less than 1          Labs/Imaging Results for orders placed or  performed during the hospital encounter of 06/15/18 (from the past 48 hour(s))  Basic metabolic panel     Status: Abnormal   Collection Time: 06/15/18  9:43 AM  Result Value Ref Range   Sodium 138 135 - 145 mmol/L   Potassium 2.7 (LL) 3.5 - 5.1 mmol/L    Comment: CRITICAL RESULT CALLED TO, READ BACK BY AND VERIFIED WITH: B.JESSE,RN 1035 06/15/18 CLARK,S    Chloride 101 98 - 111 mmol/L   CO2 25 22 - 32 mmol/L   Glucose, Bld 190 (H) 70 - 99 mg/dL   BUN <5 (L) 8 - 23 mg/dL   Creatinine, Ser 0.76 0.61 - 1.24 mg/dL   Calcium 8.6 (L) 8.9 - 10.3 mg/dL   GFR calc non Af Amer >60 >60 mL/min   GFR calc Af Amer >60 >60 mL/min    Comment: (NOTE) The eGFR has been calculated using the CKD EPI equation. This calculation has not been validated in all clinical situations. eGFR's persistently <60 mL/min signify possible Chronic Kidney Disease.    Anion gap 12 5 - 15    Comment: Performed at [facility] [address], [place], [place] [address]  CBC     Status: Abnormal   Collection Time: 06/15/18  9:43 AM  Result Value Ref  Range   WBC 3.6 (L) 4.0 - 10.5 K/uL   RBC 4.00 (L) 4.22 - 5.81 MIL/uL   Hemoglobin 8.4 (L) 13.0 - 17.0 g/dL   HCT 29.1 (L) 39.0 - 52.0 %   MCV 72.8 (L) 78.0 - 100.0 fL   MCH 21.0 (L) 26.0 - 34.0 pg   MCHC 28.9 (L) 30.0 - 36.0 g/dL   RDW 18.6 (H) 11.5 - 15.5 %   Platelets 359 150 - 400 K/uL    Comment: Performed at [facility] [address], [place], [place] [address]  I-stat troponin, ED     Status: None   Collection Time: 06/15/18 10:02 AM  Result Value Ref Range   Troponin i, poc 0.00 0.00 - 0.08 ng/mL   Comment 3            Comment: Due to the release kinetics of cTnI, a negative result within the first hours of the onset of symptoms does not rule out myocardial infarction with certainty. If myocardial infarction is still suspected, repeat the test at appropriate intervals.   POC occult blood, ED Provider will collect     Status: None    Collection Time: 06/15/18 11:16 AM  Result Value Ref Range   Fecal Occult Bld NEGATIVE NEGATIVE  Magnesium     Status: Abnormal   Collection Time: 06/15/18  2:01 PM  Result Value Ref Range   Magnesium 1.5 (L) 1.7 - 2.4 mg/dL    Comment: Performed at [facility] [address], [place], [place] [address]  Troponin I     Status: None   Collection Time: 06/15/18  2:01 PM  Result Value Ref Range   Troponin I <0.03 <0.03 ng/mL    Comment: Performed at [facility] [address], [place], [place] [address]   Dg Chest 2 View  Result Date: 06/15/2018 CLINICAL DATA:  Status post fall, dizziness EXAM: CHEST - 2 VIEW COMPARISON:  08/31/2017 FINDINGS: The lungs are hyperinflated likely secondary to COPD. There is no focal parenchymal opacity. There is no pleural effusion or pneumothorax. The heart and mediastinal contours are unremarkable. The osseous structures are unremarkable. IMPRESSION: No active cardiopulmonary disease. Electronically Signed   By: Kathreen Devoid   On: 06/15/2018 10:06   Dg Wrist Complete Left  Result Date: 06/15/2018 CLINICAL DATA:  Fall.  Wrist pain. EXAM: LEFT WRIST - COMPLETE 3+ VIEW COMPARISON:  None. FINDINGS: Advanced arthritic changes in the left wrist. Large subchondral cyst in the distal radius. Advanced degenerative changes at the 1st carpometacarpal joint. No acute bony abnormality. Specifically, no fracture, subluxation, or dislocation. IMPRESSION: Advanced arthritic changes in the left wrist. No acute bony abnormality. Electronically Signed   By: Rolm Baptise M.D.   On: 06/15/2018 10:06   Ct Head Wo Contrast  Result Date: 06/15/2018 CLINICAL DATA:  Fall, head trauma, on anticoagulation. EXAM: CT HEAD WITHOUT CONTRAST TECHNIQUE: Contiguous axial images were obtained from the base of the skull through the vertex without intravenous contrast. COMPARISON:  01/08/2014 FINDINGS: Brain: There is atrophy and chronic small vessel disease changes. Calcifications in  the basal ganglia, unchanged. Vascular: No hyperdense vessel or unexpected calcification. Skull: No acute calvarial abnormality. Sinuses/Orbits: Visualized paranasal sinuses and mastoids clear. Orbital soft tissues unremarkable. Other: Soft tissue swelling over the left forehead. IMPRESSION: No acute intracranial abnormality. Atrophy, chronic microvascular disease. Electronically Signed   By: Rolm Baptise M.D.   On: 06/15/2018 10:32    Pending Labs [employer] (From  admission, onward)    Start     Ordered   06/16/18 0500  CBC  Tomorrow morning,   R     06/15/18 1323   06/16/18 1771  Basic metabolic panel  Tomorrow morning,   R     06/15/18 1323   06/16/18 0500  Ferritin  Tomorrow morning,   R     06/15/18 1328   06/16/18 0500  Iron and TIBC  Tomorrow morning,   R     06/15/18 1328   06/15/18 1323  TSH  Add-on,   R     06/15/18 1323   06/15/18 1323  Troponin I  Now then every 6 hours,   R     06/15/18 1323          Vitals/Pain Today's Vitals   06/15/18 1306 06/15/18 1330 06/15/18 1407 06/15/18 1500  BP: (!) 144/92 (!) 149/84 (!) 148/85 (!) 169/77  Pulse: 84 (!) 52 71 (!) 51  Resp: 18 13 18 14   Temp:      TempSrc:      SpO2: 96% 96% 98% 96%  PainSc:        Isolation Precautions No active isolations  Medications Medications  finasteride (PROSCAR) tablet 5 mg (has no administration in time range)  clopidogrel (PLAVIX) tablet 75 mg (has no administration in time range)  brimonidine (ALPHAGAN) 0.15 % ophthalmic solution 1 drop (has no administration in time range)  latanoprost (XALATAN) 0.005 % ophthalmic solution 1 drop (has no administration in time range)  simvastatin (ZOCOR) tablet 40 mg (has no administration in time range)  lisinopril (PRINIVIL,ZESTRIL) tablet 20 mg (has no administration in time range)  furosemide (LASIX) tablet 40 mg (has no administration in time range)  traMADol (ULTRAM) tablet 50 mg (has no administration in time range)  enoxaparin (LOVENOX)  injection 40 mg (has no administration in time range)  sodium chloride flush (NS) 0.9 % injection 3 mL (3 mLs Intravenous Not Given 06/15/18 1330)  ondansetron (ZOFRAN) tablet 4 mg (has no administration in time range)    Or  ondansetron (ZOFRAN) injection 4 mg (has no administration in time range)  acetaminophen (TYLENOL) tablet 650 mg (has no administration in time range)    Or  acetaminophen (TYLENOL) suppository 650 mg (has no administration in time range)  albuterol (PROVENTIL) (2.5 MG/3ML) 0.083% nebulizer solution 2.5 mg (has no administration in time range)  HYDROcodone-acetaminophen (NORCO/VICODIN) 5-325 MG per tablet 1 tablet (has no administration in time range)  insulin aspart (novoLOG) injection 0-9 Units (has no administration in time range)  insulin aspart (novoLOG) injection 0-5 Units (has no administration in time range)  potassium chloride SA (K-DUR,KLOR-CON) CR tablet 40 mEq (40 mEq Oral Given 06/15/18 1129)  morphine 4 MG/ML injection 4 mg (4 mg Intravenous Given 06/15/18 1122)  potassium chloride 10 mEq in 100 mL IVPB ( Intravenous Stopped 06/15/18 1232)  diphenhydrAMINE (BENADRYL) injection 12.5 mg (12.5 mg Intravenous Given 06/15/18 1128)    Mobility walks

## 2018-06-15 NOTE — ED Triage Notes (Signed)
Pt states he fell down his stairs this morning. Pt sates he has pain in his left wrist. Pt poor historian which daughter reports his normal. He does reports some dizziness and left side ribcage pain.

## 2018-06-15 NOTE — H&P (Signed)
History and Physical    Robert [name] TKP:[id number] DOB: 1932/01/10 DOA: 06/15/2018  Referring MD/NP/PA: Domenic Moras, PA-C PCP: Lavone Orn, MD  Patient coming from: home  Chief Complaint: Fall   I have personally briefly reviewed patient's old medical records in [place]   HPI: Robert [name] is a 84 y.o. male with medical history significant of HTN, HLD, CAD s/p PCI, AF (not on anticoagulation), DM type II, AAA, and remote tobacco use; who presents after a fall.  At baseline he ambulates without assistance.  He was stepping down off the porch when his foot slipped  on the step and he fell to the ground.  Patient reports falling down approximately 3 steps.  He reports hitting his head, but is unsure if he lost consciousness or not.  After the fall patient reported being able to get up without need of assistance but complained of left rib pain, left wrist pain, and headache.  He patient is on Plavix, but no other blood thinners.  Denies having any recent fever, shortness of breath, palpitations, leg swelling, shortness of breath, cough, nausea, vomiting, diarrhea, or dysuria.  ED Course: Upon admission into the emergency department patient was noted to be afebrile, pulse 53-71, blood pressures 122/73-176/87, and all other vital signs maintained.  Labs revealed WBC 3.6, hemoglobin 8.4,  potassium 2.7, and initial troponin negative.  Due to the drop in hemoglobin (previously 10.2 09/01/2017) a stool guaiac was obtained, but noted to be negative.  EKG showed inferior and lateral changes previously seen on other EKGs.  Imaging studies a CT of the brain without contrast, chest x-ray, and x-rays of the wrist revealed no acute abnormalities.  Patient was given 40 mEq of potassium chloride p.o., 10 mEq IV, 4 mg of morphine, and 12.5 mg of Benadryl IV.  Due to patient being a poor historian there is concern for the possibility of syncope as cause of patient's fall.    Review of Systems    Constitutional: Negative for chills and fever.  HENT: Positive for hearing loss. Negative for nosebleeds.   Eyes: Negative for photophobia and pain.  Respiratory: Negative for cough and shortness of breath.   Cardiovascular: Negative for chest pain and leg swelling.  Gastrointestinal: Negative for abdominal pain, nausea and vomiting.  Genitourinary: Negative for dysuria and hematuria.  Musculoskeletal: Positive for falls and joint pain.  Skin: Negative for itching and rash.  Neurological: Positive for headaches. Negative for loss of consciousness and weakness.  Psychiatric/Behavioral: Negative for substance abuse and suicidal ideas.    Past Medical History:  Diagnosis Date  . Atrial fibrillation ([place])   . CAD (coronary artery disease)    DES to Cfx  . Diabetes mellitus without complication ([place])   . History of nuclear stress test 02/2012   lexiscan; mild-mod perfusion defect in basal inferoseptal, mid inferoseptal apical anterior; no change from previous study, abnormal test   . Hyperlipidemia   . Hypertension   . Myocardial infarction ([place]) 1980s  . PAD (peripheral artery disease) ([place])    aortic stent graft 2008  . PUD (peptic ulcer disease)   . Small bowel obstruction ([place]) 10/2011  . Ulcer     Past Surgical History:  Procedure Laterality Date  . CARDIAC CATHETERIZATION  1987   occluded marginal branch of RCA, treated medically - Dr Melvern Banker  . CORONARY ANGIOPLASTY WITH STENT PLACEMENT  03/2003   2.5x18mm Taxus DES to Cfx  . ESOPHAGOGASTRODUODENOSCOPY N/A 03/14/2014   Procedure: ESOPHAGOGASTRODUODENOSCOPY (EGD) with Balloon  Dilation;  Surgeon: Garlan Fair, MD;  Location: [facility];  Service: Endoscopy;  Laterality: N/A;  . EYE SURGERY Right    cataract surgery  . GASTRECTOMY  50 yrs ago - 2 operations   had surgery at the age of 42 for the ulcer  . ILIAC ARTERY ANEURYSM REPAIR     Repaied by Dr. Kellie Simmering april 2009  . JOINT REPLACEMENT Left    hip replacement  .  TRANSTHORACIC ECHOCARDIOGRAM  11/2009   EF=>55%, mild conc LVH, normal LV systolic function; mild MR; mild-mod TR with RV systolic pressure elevated at 30-41mmHg; mild-mod AV regurg; mild pulm valve regurg     reports that he quit smoking about 25 years ago. His smoking use included cigarettes. He quit after 0.50 years of use. He has never used smokeless tobacco. He reports that he does not drink alcohol or use drugs.  Allergies  Allergen Reactions  . Diona Fanti W/Codeine [Aspirin-Codeine] Other (See Comments)    Irritates his water system    Family History  Problem Relation Age of Onset  . Stroke Father   . Heart disease Father   . Hypertension Father   . Heart disease Daughter        Before age 76  . Diabetes Sister        DVT-Blood clots in vein    Prior to Admission medications   Medication Sig Start Date End Date Taking? Authorizing Provider  ALPHAGAN P 0.1 % SOLN Place 1 drop into both eyes at bedtime.  09/16/15  Yes [provider]  clopidogrel (PLAVIX) 75 MG tablet Take 75 mg by mouth daily.     Yes [provider]  finasteride (PROSCAR) 5 MG tablet Take 5 mg by mouth daily.     Yes [provider]  furosemide (LASIX) 40 MG tablet Take 40 mg by mouth daily.  08/29/14  Yes [provider]  latanoprost (XALATAN) 0.005 % ophthalmic solution Place 1 drop into the left eye at bedtime.    Yes [provider]  lisinopril (PRINIVIL,ZESTRIL) 20 MG tablet TAKE 1 TABLET BY MOUTH EVERY DAY Patient taking differently: Take 20 mg by mouth daily.  05/29/18  Yes Leonie Man, MD  metFORMIN (GLUCOPHAGE) 500 MG tablet Take 500 mg by mouth 2 (two) times daily. 09/04/15  Yes [provider]  simvastatin (ZOCOR) 40 MG tablet Take 40 mg by mouth daily.  08/29/14  Yes [provider]  traMADol (ULTRAM) 50 MG tablet Take 50 mg by mouth every 6 (six) hours as needed. 06/10/18  Yes [provider]  HYDROcodone-acetaminophen  (NORCO/VICODIN) 5-325 MG tablet Take 1-2 tablets by mouth every 4 (four) hours as needed. Patient not taking: Reported on 06/15/2018 04/11/17   Malvin Johns, MD    Physical Exam:  Constitutional: Elderly male in NAD, calm, comfortable Vitals:   06/15/18 0911 06/15/18 1036 06/15/18 1101 06/15/18 1200  BP: 130/79 122/73 (!) 167/78 (!) 176/87  Pulse: 71 (!) 55 (!) 53 (!) 55  Resp: 20 20 16 12   Temp: 98.1 F (36.7 C) 98.1 F (36.7 C)    TempSrc: Oral Oral    SpO2: 100% 99% 100% 97%   Eyes: PERRL, lids and conjunctivae normal ENMT: Mucous membranes are moist. Posterior pharynx clear of any exudate or lesions. Hard of hearing. Neck: normal, supple, no masses, no thyromegaly Respiratory: clear to auscultation bilaterally, no wheezing, no crackles. Normal respiratory effort. No accessory muscle use.  Tenderness to palpation of the left rib cage.  Cardiovascular: Bradycardic, no murmurs / rubs / gallops. No extremity edema. 2+ pedal pulses. No carotid bruits.  Abdomen: no tenderness, no masses palpated. No hepatosplenomegaly. Bowel sounds positive.  Musculoskeletal: no clubbing / cyanosis. No joint deformity upper and lower extremities.  Swelling of the left wrist with decreased range of motion noted. Skin: Skin skin tears from fall noted to left hand.   Neurologic: CN 2-12 grossly intact. Sensation intact, DTR normal. Strength 5/5 in all 4.  Psychiatric: Normal judgment and insight. Alert and oriented x 3. Normal mood.     Labs on Admission: I have personally reviewed following labs and imaging studies  CBC: Recent Labs  Lab 06/15/18 0943  WBC 3.6*  HGB 8.4*  HCT 29.1*  MCV 72.8*  PLT 387   Basic Metabolic Panel: Recent Labs  Lab 06/15/18 0943  NA 138  K 2.7*  CL 101  CO2 25  GLUCOSE 190*  BUN <5*  CREATININE 0.76  CALCIUM 8.6*   GFR: CrCl cannot be calculated (Unknown ideal weight.). Liver Function Tests: No results for input(s): AST, ALT, ALKPHOS, BILITOT, PROT,  ALBUMIN in the last 168 hours. No results for input(s): LIPASE, AMYLASE in the last 168 hours. No results for input(s): AMMONIA in the last 168 hours. Coagulation Profile: No results for input(s): INR, PROTIME in the last 168 hours. Cardiac Enzymes: No results for input(s): CKTOTAL, CKMB, CKMBINDEX, TROPONINI in the last 168 hours. BNP (last 3 results) No results for input(s): PROBNP in the last 8760 hours. HbA1C: No results for input(s): HGBA1C in the last 72 hours. CBG: No results for input(s): GLUCAP in the last 168 hours. Lipid Profile: No results for input(s): CHOL, HDL, LDLCALC, TRIG, CHOLHDL, LDLDIRECT in the last 72 hours. Thyroid Function Tests: No results for input(s): TSH, T4TOTAL, FREET4, T3FREE, THYROIDAB in the last 72 hours. Anemia Panel: No results for input(s): VITAMINB12, FOLATE, FERRITIN, TIBC, IRON, RETICCTPCT in the last 72 hours. Urine analysis:    Component Value Date/Time   COLORURINE YELLOW 12/09/2014 1330   APPEARANCEUR CLEAR 12/09/2014 1330   LABSPEC 1.026 12/09/2014 1330   PHURINE 5.5 12/09/2014 1330   GLUCOSEU NEGATIVE 12/09/2014 1330   HGBUR NEGATIVE 12/09/2014 1330   BILIRUBINUR NEGATIVE 12/09/2014 1330   KETONESUR 15 (A) 12/09/2014 1330   PROTEINUR 30 (A) 12/09/2014 1330   UROBILINOGEN 0.2 12/09/2014 1330   NITRITE NEGATIVE 12/09/2014 1330   LEUKOCYTESUR NEGATIVE 12/09/2014 1330   Sepsis Labs: No results found for this or any previous visit (from the past 240 hour(s)).   Radiological Exams on Admission: Dg Chest 2 View  Result Date: 06/15/2018 CLINICAL DATA:  Status post fall, dizziness EXAM: CHEST - 2 VIEW COMPARISON:  08/31/2017 FINDINGS: The lungs are hyperinflated likely secondary to COPD. There is no focal parenchymal opacity. There is no pleural effusion or pneumothorax. The heart and mediastinal contours are unremarkable. The osseous structures are unremarkable. IMPRESSION: No active cardiopulmonary disease. Electronically Signed   By:  Kathreen Devoid   On: 06/15/2018 10:06   Dg Wrist Complete Left  Result Date: 06/15/2018 CLINICAL DATA:  Fall.  Wrist pain. EXAM: LEFT WRIST - COMPLETE 3+ VIEW COMPARISON:  None. FINDINGS: Advanced arthritic changes in the left wrist. Large subchondral cyst in the distal radius. Advanced degenerative changes at the 1st carpometacarpal joint. No acute bony abnormality. Specifically, no fracture, subluxation, or dislocation. IMPRESSION: Advanced arthritic changes in the left wrist. No acute bony abnormality. Electronically Signed   By: Rolm Baptise M.D.   On: 06/15/2018 10:06  Ct Head Wo Contrast  Result Date: 06/15/2018 CLINICAL DATA:  Fall, head trauma, on anticoagulation. EXAM: CT HEAD WITHOUT CONTRAST TECHNIQUE: Contiguous axial images were obtained from the base of the skull through the vertex without intravenous contrast. COMPARISON:  01/08/2014 FINDINGS: Brain: There is atrophy and chronic small vessel disease changes. Calcifications in the basal ganglia, unchanged. Vascular: No hyperdense vessel or unexpected calcification. Skull: No acute calvarial abnormality. Sinuses/Orbits: Visualized paranasal sinuses and mastoids clear. Orbital soft tissues unremarkable. Other: Soft tissue swelling over the left forehead. IMPRESSION: No acute intracranial abnormality. Atrophy, chronic microvascular disease. Electronically Signed   By: Rolm Baptise M.D.   On: 06/15/2018 10:32    EKG: Independently reviewed.  Sinus bradycardia at 52 bpm with first-degree heart block incomplete right bundle branch block, and ST wave changes in the inferolateral leads  Assessment/Plan Fall with head contusion, left side rib pain, and left wrist sprain: Acute.  Reports slipping and falling down 3 steps.  CT scan of the brain showed no acute abnormalities.  X-rays showed advanced arthritis of the left wrist, but no acute fractures.  Chest x-ray showed no clear osseous abnormalities, but a rib series was not obtained. - Admit to a  telemetry bed - Neurochecks - Incentive spirometry - Splint wrist - Pain control - Physical therapy to eval and treat  Microcytic hypochromic anemia: Hemoglobin noted to be 8.4 on admission with low MCV and MCH..  Last noted to be 10.2 in 08/2017.  Patient denies any reports of dark or black stools. - Check TIBC, iron, ferritin, and repeat CBC  in a.m.  - May warrant consult to GI in a.m.  Abnormal EKG: Patient found to be bradycardic at 52 bpm with first-degree heart block.  ST wave changes appear possibly related with previous known coronary artery disease. - Follow-up telemetry overnight - Trend troponins - Consider need of cards consult in a.m.  Hypokalemia: Acute.  Initial potassium noted be 2.7 on admission.  Patient on furosemide 40 daily.  Patient given a total of 50 mEq in the emergency department. - Check magnesium - Give additional 30 mEq IV potassium chloride - Continue to monitor and replace as needed  CAD: Patient with prior inferior wall MI with RCA CTO on cath in 1980's.  LCx stent placed 2004. - Continue Plavix  Essential hypertension - Continue lisinopril and furosemide for now  AAA: Previously noted to be 3.3 cm in 2015. - Continue outpatient monitoring and follow-up  Hard of hearing  Diabetes mellitus type II: Last hemoglobin A1c noted with yeast in 11 of 2018.  His initial blood sugar noted to be 190 - Hypoglycemic protocol - Held metformin - CBGs q. before meals and at bedtime with sensitive SSI  Hyperlipidemia - Continue simvastatin  BPH - Continue finasteride   DVT prophylaxis: lovenox Code Status: full Family Communication: no family at bedside Disposition Plan: Discharge home once medically stable Consults called: None Admission status: Observation  Norval Morton MD [facility] Pager [phone]   If 7PM-7AM, please contact night-coverage [email or web address] Password [facility]  06/15/2018, 12:47 PM

## 2018-06-15 NOTE — Evaluation (Signed)
Physical Therapy Evaluation Patient Details Name: Robert [name] MRN: [id number] DOB: October 24, 1932 Today's Date: 06/15/2018   History of Present Illness  84 yo male with onset of fall on the stairs at home was admitted, noted low K+ which was repleted.  He has complained of dizziness, hitting his head and rib pain.  PMHx:  L wrist OA, a-fib, chest pain from MI, CAD, PUD, PVC's, SBO, PAD, PN, DM  Clinical Impression  Pt was seen for evaluation of mobility with a limited tolerance for gait due to fatigue and his O2 sats being checked.  He did experience a drop in sats but only to 92% and then began to recover.  Pt is home with daughter in the evenings and so will recommend PT come see from [facility] services, to progress his balance and safety with RW and then down to [facility].  Will need to try stairs to dc home with family as pt is home alone at times.    Follow Up Recommendations Home health PT;Supervision for mobility/OOB    Equipment Recommendations  Rolling walker with 5" wheels(unless pt can walk on stairs with [facility])    Recommendations for Other Services       Precautions / Restrictions Precautions Precautions: Fall Precaution Comments: telemetry Required Braces or Orthoses: Other Brace/Splint Other Brace/Splint: wrist splint ordered but not in his room Restrictions Weight Bearing Restrictions: No      Mobility  Bed Mobility Overal bed mobility: Modified Independent                Transfers Overall transfer level: Modified independent Equipment used: None             General transfer comment: transition to walker but does not need to stand  Ambulation/Gait Ambulation/Gait assistance: Min guard Gait Distance (Feet): 90 Feet Assistive device: Rolling walker (2 wheeled);1 person hand held assist Gait Pattern/deviations: Step-to pattern;Step-through pattern;Decreased stride length;Wide base of support;Trunk flexed Gait velocity: reduced Gait velocity interpretation: <1.8  ft/sec, indicate of risk for recurrent falls General Gait Details: pt is able to walk but decreases O2 sat to 93% with gait  Stairs            Wheelchair Mobility    Modified Rankin (Stroke Patients Only)       Balance Overall balance assessment: Needs assistance Sitting-balance support: Feet supported Sitting balance-Leahy Scale: Good     Standing balance support: Bilateral upper extremity supported;During functional activity Standing balance-Leahy Scale: Fair                               Pertinent Vitals/Pain Pain Assessment: Faces Faces Pain Scale: Hurts little more Pain Location: L ribcage with mobility to get off bed Pain Descriptors / Indicators: Aching;Sore Pain Intervention(s): Monitored during session;Repositioned    Home Living Family/patient expects to be discharged to:: Private residence Living Arrangements: Children Available Help at Discharge: Family;Available PRN/intermittently Type of Home: House Home Access: Stairs to enter Entrance Stairs-Rails: None Entrance Stairs-Number of Steps: 3 Home Layout: One level Home Equipment: Cane - single point Additional Comments: daughter reports pt uses SPC very sporadically    Prior Function Level of Independence: Needs assistance   Gait / Transfers Assistance Needed: mod I with SPC during the day  ADL's / Homemaking Assistance Needed: daughter helps with cooking and cleaning but pt has been able to dress himself        Hand Dominance   Dominant Hand: Right  Extremity/Trunk Assessment   Upper Extremity Assessment Upper Extremity Assessment: Overall WFL for tasks assessed    Lower Extremity Assessment Lower Extremity Assessment: Overall WFL for tasks assessed    Cervical / Trunk Assessment Cervical / Trunk Assessment: Normal  Communication   Communication: HOH  Cognition Arousal/Alertness: Awake/alert Behavior During Therapy: WFL for tasks assessed/performed Overall  Cognitive Status: Within Functional Limits for tasks assessed                                        General Comments General comments (skin integrity, edema, etc.): pt is not outright falling but used RW for safety with gait    Exercises     Assessment/Plan    PT Assessment Patient needs continued PT services  PT Problem List Decreased range of motion;Decreased activity tolerance;Decreased balance;Decreased mobility;Decreased coordination;Decreased safety awareness;Cardiopulmonary status limiting activity       PT Treatment Interventions DME instruction;Gait training;Stair training;Functional mobility training;Therapeutic activities;Therapeutic exercise;Balance training;Neuromuscular re-education;Patient/family education    PT Goals (Current goals can be found in the Care Plan section)  Acute Rehab PT Goals Patient Stated Goal: none stated PT Goal Formulation: With patient/family Time For Goal Achievement: 06/29/18 Potential to Achieve Goals: Good    Frequency Min 4X/week   Barriers to discharge Inaccessible home environment;Decreased caregiver support stairs with no rails to enter and home alone at times    Co-evaluation               AM-PAC PT "6 Clicks" Daily Activity  Outcome Measure Difficulty turning over in bed (including adjusting bedclothes, sheets and blankets)?: A Little Difficulty moving from lying on back to sitting on the side of the bed? : A Little Difficulty sitting down on and standing up from a chair with arms (e.g., wheelchair, bedside commode, etc,.)?: A Little Help needed moving to and from a bed to chair (including a wheelchair)?: A Little Help needed walking in hospital room?: A Little Help needed climbing 3-5 steps with a railing? : A Little 6 Click Score: 18    End of Session Equipment Utilized During Treatment: Gait belt Activity Tolerance: Patient tolerated treatment well;Patient limited by pain Patient left: in bed;with  call bell/phone within reach;with bed alarm set;with family/visitor present Nurse Communication: Mobility status PT Visit Diagnosis: Unsteadiness on feet (R26.81);History of falling (Z91.81);Adult, failure to thrive (R62.7)    Time: 3419-3790 PT Time Calculation (min) (ACUTE ONLY): 33 min   Charges:   PT Evaluation $PT Eval Moderate Complexity: 1 Mod PT Treatments $Gait Training: 8-22 mins       Ramond Dial 06/15/2018, 5:47 PM  Mee Hives, PT MS Acute Rehab Dept. Number: [place]

## 2018-06-16 DIAGNOSIS — I2583 Coronary atherosclerosis due to lipid rich plaque: Secondary | ICD-10-CM | POA: Diagnosis not present

## 2018-06-16 DIAGNOSIS — S0083XA Contusion of other part of head, initial encounter: Secondary | ICD-10-CM | POA: Diagnosis not present

## 2018-06-16 DIAGNOSIS — S61412A Laceration without foreign body of left hand, initial encounter: Secondary | ICD-10-CM | POA: Diagnosis not present

## 2018-06-16 DIAGNOSIS — W19XXXA Unspecified fall, initial encounter: Secondary | ICD-10-CM | POA: Diagnosis not present

## 2018-06-16 DIAGNOSIS — R9431 Abnormal electrocardiogram [ECG] [EKG]: Secondary | ICD-10-CM | POA: Diagnosis not present

## 2018-06-16 DIAGNOSIS — I714 Abdominal aortic aneurysm, without rupture: Secondary | ICD-10-CM | POA: Diagnosis not present

## 2018-06-16 LAB — BASIC METABOLIC PANEL
Anion gap: 7 (ref 5–15)
BUN: 6 mg/dL — AB (ref 8–23)
CHLORIDE: 103 mmol/L (ref 98–111)
CO2: 29 mmol/L (ref 22–32)
Calcium: 9 mg/dL (ref 8.9–10.3)
Creatinine, Ser: 0.84 mg/dL (ref 0.61–1.24)
GFR calc non Af Amer: >60 mL/min (ref >60)
Glucose, Bld: 147 mg/dL — ABNORMAL HIGH (ref 70–99)
POTASSIUM: 3.6 mmol/L (ref 3.5–5.1)
SODIUM: 139 mmol/L (ref 135–145)

## 2018-06-16 LAB — IRON AND TIBC
IRON: 20 ug/dL — AB (ref 45–182)
SATURATION RATIOS: 5 % — AB (ref 17.9–39.5)
TIBC: 434 ug/dL (ref 250–450)
UIBC: 414 ug/dL

## 2018-06-16 LAB — GLUCOSE, CAPILLARY
Glucose-Capillary: 149 mg/dL — ABNORMAL HIGH (ref 70–99)
Glucose-Capillary: 192 mg/dL — ABNORMAL HIGH (ref 70–99)

## 2018-06-16 LAB — CBC
HCT: 28.8 % — ABNORMAL LOW (ref 39.0–52.0)
HEMOGLOBIN: 8.5 g/dL — AB (ref 13.0–17.0)
MCH: 20.9 pg — AB (ref 26.0–34.0)
MCHC: 29.5 g/dL — ABNORMAL LOW (ref 30.0–36.0)
MCV: 70.9 fL — ABNORMAL LOW (ref 78.0–100.0)
Platelets: 313 10*3/uL (ref 150–400)
RBC: 4.06 MIL/uL — AB (ref 4.22–5.81)
RDW: 18.5 % — ABNORMAL HIGH (ref 11.5–15.5)
WBC: 5 10*3/uL (ref 4.0–10.5)

## 2018-06-16 LAB — FERRITIN: FERRITIN: 9 ng/mL — AB (ref 24–336)

## 2018-06-16 LAB — TROPONIN I

## 2018-06-16 LAB — MAGNESIUM: Magnesium: 2.2 mg/dL (ref 1.7–2.4)

## 2018-06-16 MED ORDER — FERROUS SULFATE 325 (65 FE) MG PO TABS: 325.0000 mg | ORAL_TABLET | Freq: Every day | ORAL | Status: DC

## 2018-06-16 MED ORDER — FERROUS SULFATE 325 (65 FE) MG PO TABS: 325.0000 mg | ORAL_TABLET | Freq: Every day | ORAL | 0 refills | Status: DC

## 2018-06-16 MED ORDER — POTASSIUM CHLORIDE 10 MEQ/100ML IV SOLN
INTRAVENOUS | Status: AC
  Filled 2018-06-16: qty 100

## 2018-06-16 NOTE — Progress Notes (Signed)
Robert [name] to be D/C'd Home per MD order.  Discussed prescriptions and follow up appointments with the patient. Prescriptions given to patient, medication list explained in detail. Pt verbalized understanding.  Allergies as of 06/16/2018      Reactions   Asa W/codeine [aspirin-codeine] Other (See Comments)   Irritates his water system      Medication List    TAKE these medications   ALPHAGAN P 0.1 % Soln Generic drug:  brimonidine Place 1 drop into both eyes at bedtime.   clopidogrel 75 MG tablet Commonly known as:  PLAVIX Take 75 mg by mouth daily.   ferrous sulfate 325 (65 FE) MG tablet Take 1 tablet (325 mg total) by mouth daily with breakfast. Start taking on:  06/17/2018   finasteride 5 MG tablet Commonly known as:  PROSCAR Take 5 mg by mouth daily.   furosemide 40 MG tablet Commonly known as:  LASIX Take 40 mg by mouth daily.   HYDROcodone-acetaminophen 5-325 MG tablet Commonly known as:  NORCO/VICODIN Take 1-2 tablets by mouth every 4 (four) hours as needed.   latanoprost 0.005 % ophthalmic solution Commonly known as:  XALATAN Place 1 drop into the left eye at bedtime.   lisinopril 20 MG tablet Commonly known as:  PRINIVIL,ZESTRIL TAKE 1 TABLET BY MOUTH EVERY DAY   metFORMIN 500 MG tablet Commonly known as:  GLUCOPHAGE Take 500 mg by mouth 2 (two) times daily.   simvastatin 40 MG tablet Commonly known as:  ZOCOR Take 40 mg by mouth daily.   traMADol 50 MG tablet Commonly known as:  ULTRAM Take 50 mg by mouth every 6 (six) hours as needed.       Vitals:   06/16/18 0421 06/16/18 1347  BP: 138/79 (!) 165/95  Pulse: (!) 53 78  Resp: 18 18  Temp: 98.5 F (36.9 C) 98.4 F (36.9 C)  SpO2: 97% 100%    Skin clean, dry and intact without evidence of skin break down, no evidence of skin tears noted. IV catheter discontinued intact. Site without signs and symptoms of complications. Dressing and pressure applied. Pt denies pain at this time. No  complaints noted.  An After Visit Summary was printed and given to the patient. Patient escorted via [place], and D/C home via private auto.  Chapman Fitch BSN, RN [employer] [address] Phone [phone]

## 2018-06-16 NOTE — Discharge Summary (Signed)
Physician Discharge Summary  Robert [name] OVZ:[id number] DOB: 02-20-1932 DOA: 06/15/2018  PCP: Robert Orn, MD  Admit date: 06/15/2018 Discharge date: 06/16/2018  Admitted From: Home Disposition:  Home  Discharge Condition:Stable CODE STATUS:FULL Diet recommendation: Heart Healthy  Brief/Interim Summary:  HPI: Robert [name] is a 84 y.o. male with medical history significant of HTN, HLD, CAD s/p PCI, AF (not on anticoagulation), DM type II, AAA, and remote tobacco use; who presents after a fall.  At baseline he ambulates without assistance.  He was stepping down off the porch when his foot slipped  on the step and he fell to the ground.  Patient reports falling down approximately 3 steps.  He reports hitting his head, but is unsure if he lost consciousness or not.  After the fall patient reported being able to get up without need of assistance but complained of left rib pain, left wrist pain, and headache.  He patient is on Plavix, but no other blood thinners.  Denies having any recent fever, shortness of breath, palpitations, leg swelling, shortness of breath, cough, nausea, vomiting, diarrhea, or dysuria.  ED Course: Upon admission into the emergency department patient was noted to be afebrile, pulse 53-71, blood pressures 122/73-176/87, and all other vital signs maintained.  Labs revealed WBC 3.6, hemoglobin 8.4,  potassium 2.7, and initial troponin negative.  Due to the drop in hemoglobin (previously 10.2 09/01/2017) a stool guaiac was obtained, but noted to be negative.  EKG showed inferior and lateral changes previously seen on other EKGs.  Imaging studies a CT of the brain without contrast, chest x-ray, and x-rays of the wrist revealed no acute abnormalities.  Patient was given 40 mEq of potassium chloride p.o., 10 mEq IV, 4 mg of morphine, and 12.5 mg of Benadryl IV.  Due to patient being a poor historian there is concern for the possibility of syncope as cause of patient's  fall.  Hospital Course: His hospital course remained stable.  This morning he was hemodynamically stable.  Sitting in the chair without any complaints.  Completely alert and oriented.  His iron studies showed low ferritin and iron with high suspicion for iron deficiency anemia.  Started on iron supplements.  He will benefit from follow-up with gastroenterology as an outpatient for possible EGD and colonoscopy if decided to do so.  Patient has been requested to follow-up with his PCP in a week and do CBC and BMP testing.  He is stable for discharge home today.  Patient was advised with physical therapy and recommended home health PT.  Following problems were addressed during his hospitalization:  Fall with head contusion, left side rib pain, and left wrist sprain: Acute.  Reports slipping and falling down 3 steps.  CT scan of the brain showed no acute abnormalities.  X-rays showed advanced arthritis of the left wrist, but no acute fractures.  Chest x-ray showed no clear osseous abnormalities. PT recommended home health physical therapy.  Microcytic hypochromic anemia: Hemoglobin stable at 8.5 with low MCV and MCH..  Last noted to be 10.2 in 08/2017.  Patient denies any reports of dark or black stools.  Iron studies suggestive of ID anemia.  Started on iron supplements.  GI follow-up as an outpatient  Abnormal EKG: Patient found to be bradycardic at 52 bpm with first-degree heart block.  ST wave changes appear possibly related with previous known coronary artery disease.  Troponins negative.  Patient is completely asymptomatic .  Hypokalemia/hypomagnesemia:  Supplemented and corrected.  CAD: Patient with prior inferior  wall MI with RCA CTO on cath in 1980's.  LCx stent placed 2004.Continue Plavix  Essential hypertension:Continue home meds  AAA: Previously noted to be 3.3 cm in 2015.Continue outpatient monitoring and follow-up  Diabetes mellitus type II: Last hemoglobin A1c noted with yeast in  11 of 2018.  Continue home meds  Hyperlipidemia:Continue simvastatin  SWN:IOEVOJJK finasteride  Discharge Diagnoses:  Principal Problem:   Fall Active Problems:   CAD (coronary artery disease)   Hyperlipidemia   AAA (abdominal aortic aneurysm) (HCC)   BPH (benign prostatic hyperplasia)   Microcytic hypochromic anemia   Hypokalemia   Abnormal EKG    Discharge Instructions  Discharge Instructions    Diet - low sodium heart healthy   Complete by:  As directed    Discharge instructions   Complete by:  As directed    1) Follow up with your PCP in a week.  Check CBC and BMP during the follow-up. 2) Follow with gastroenterology as an outpatient in 4 weeks for possible EGD/colonoscopy.  Name and number the provider has been attached.   Increase activity slowly   Complete by:  As directed      Allergies as of 06/16/2018      Reactions   Asa W/codeine [aspirin-codeine] Other (See Comments)   Irritates his water system      Medication List    TAKE these medications   ALPHAGAN P 0.1 % Soln Generic drug:  brimonidine Place 1 drop into both eyes at bedtime.   clopidogrel 75 MG tablet Commonly known as:  PLAVIX Take 75 mg by mouth daily.   ferrous sulfate 325 (65 FE) MG tablet Take 1 tablet (325 mg total) by mouth daily with breakfast. Start taking on:  06/17/2018   finasteride 5 MG tablet Commonly known as:  PROSCAR Take 5 mg by mouth daily.   furosemide 40 MG tablet Commonly known as:  LASIX Take 40 mg by mouth daily.   HYDROcodone-acetaminophen 5-325 MG tablet Commonly known as:  NORCO/VICODIN Take 1-2 tablets by mouth every 4 (four) hours as needed.   latanoprost 0.005 % ophthalmic solution Commonly known as:  XALATAN Place 1 drop into the left eye at bedtime.   lisinopril 20 MG tablet Commonly known as:  PRINIVIL,ZESTRIL TAKE 1 TABLET BY MOUTH EVERY DAY   metFORMIN 500 MG tablet Commonly known as:  GLUCOPHAGE Take 500 mg by mouth 2 (two) times daily.    simvastatin 40 MG tablet Commonly known as:  ZOCOR Take 40 mg by mouth daily.   traMADol 50 MG tablet Commonly known as:  ULTRAM Take 50 mg by mouth every 6 (six) hours as needed.      Follow-up Information    Robert Orn, MD. Schedule an appointment as soon as possible for a visit in 1 week(s).   Specialty:  Internal Medicine Contact information: [address], [address] [place] [address] [phone]        Jackquline Denmark, MD. Schedule an appointment as soon as possible for a visit in 4 week(s).   Specialties:  Gastroenterology, Internal Medicine Contact information: [place] [address] [phone]          Allergies  Allergen Reactions  . Diona Fanti W/Codeine [Aspirin-Codeine] Other (See Comments)    Irritates his water system    Consultations: None  Procedures/Studies: Dg Chest 2 View  Result Date: 06/15/2018 CLINICAL DATA:  Status post fall, dizziness EXAM: CHEST - 2 VIEW COMPARISON:  08/31/2017 FINDINGS: The  lungs are hyperinflated likely secondary to COPD. There is no focal parenchymal opacity. There is no pleural effusion or pneumothorax. The heart and mediastinal contours are unremarkable. The osseous structures are unremarkable. IMPRESSION: No active cardiopulmonary disease. Electronically Signed   By: Kathreen Devoid   On: 06/15/2018 10:06   Dg Wrist Complete Left  Result Date: 06/15/2018 CLINICAL DATA:  Fall.  Wrist pain. EXAM: LEFT WRIST - COMPLETE 3+ VIEW COMPARISON:  None. FINDINGS: Advanced arthritic changes in the left wrist. Large subchondral cyst in the distal radius. Advanced degenerative changes at the 1st carpometacarpal joint. No acute bony abnormality. Specifically, no fracture, subluxation, or dislocation. IMPRESSION: Advanced arthritic changes in the left wrist. No acute bony abnormality. Electronically Signed   By: Rolm Baptise M.D.   On: 06/15/2018 10:06   Ct Head Wo Contrast  Result Date:  06/15/2018 CLINICAL DATA:  Fall, head trauma, on anticoagulation. EXAM: CT HEAD WITHOUT CONTRAST TECHNIQUE: Contiguous axial images were obtained from the base of the skull through the vertex without intravenous contrast. COMPARISON:  01/08/2014 FINDINGS: Brain: There is atrophy and chronic small vessel disease changes. Calcifications in the basal ganglia, unchanged. Vascular: No hyperdense vessel or unexpected calcification. Skull: No acute calvarial abnormality. Sinuses/Orbits: Visualized paranasal sinuses and mastoids clear. Orbital soft tissues unremarkable. Other: Soft tissue swelling over the left forehead. IMPRESSION: No acute intracranial abnormality. Atrophy, chronic microvascular disease. Electronically Signed   By: Rolm Baptise M.D.   On: 06/15/2018 10:32      Discharge Exam: Vitals:   06/16/18 0421 06/16/18 1347  BP: 138/79 (!) 165/95  Pulse: (!) 53 78  Resp: 18 18  Temp: 98.5 F (36.9 C) 98.4 F (36.9 C)  SpO2: 97% 100%   Vitals:   06/15/18 1556 06/15/18 2123 06/16/18 0421 06/16/18 1347  BP: (!) 162/79 134/76 138/79 (!) 165/95  Pulse: 62 62 (!) 53 78  Resp: 10 16 18 18   Temp: 97.8 F (36.6 C) 98.1 F (36.7 C) 98.5 F (36.9 C) 98.4 F (36.9 C)  TempSrc:  Oral Oral Oral  SpO2: 100% 100% 97% 100%  Weight: 63.9 kg     Height: 5\' 11"  (1.803 m)       General: Pt is alert, awake, not in acute distress Cardiovascular: RRR, S1/S2 +, no rubs, no gallops Respiratory: CTA bilaterally, no wheezing, no rhonchi Abdominal: Soft, NT, ND, bowel sounds + Extremities: no edema, no cyanosis    The results of significant diagnostics from this hospitalization (including imaging, microbiology, ancillary and laboratory) are listed below for reference.     Microbiology: No results found for this or any previous visit (from the past 240 hour(s)).   Labs: BNP (last 3 results) No results for input(s): BNP in the last 8760 hours. Basic Metabolic Panel: Recent Labs  Lab  06/15/18 0943 06/15/18 1401 06/16/18 0104 06/16/18 0940  NA 138  --  139  --   K 2.7*  --  3.6  --   CL 101  --  103  --   CO2 25  --  29  --   GLUCOSE 190*  --  147*  --   BUN <5*  --  6*  --   CREATININE 0.76  --  0.84  --   CALCIUM 8.6*  --  9.0  --   MG  --  1.5*  --  2.2   Liver Function Tests: No results for input(s): AST, ALT, ALKPHOS, BILITOT, PROT, ALBUMIN in the last 168 hours. No results for  input(s): LIPASE, AMYLASE in the last 168 hours. No results for input(s): AMMONIA in the last 168 hours. CBC: Recent Labs  Lab 06/15/18 0943 06/16/18 0104  WBC 3.6* 5.0  HGB 8.4* 8.5*  HCT 29.1* 28.8*  MCV 72.8* 70.9*  PLT 359 313   Cardiac Enzymes: Recent Labs  Lab 06/15/18 1401 06/15/18 2015 06/16/18 0104  TROPONINI <0.03 <0.03 <0.03   BNP: Invalid input(s): POCBNP CBG: Recent Labs  Lab 06/15/18 1617 06/15/18 2124 06/16/18 0739 06/16/18 1133  GLUCAP 193* 155* 149* 192*   D-Dimer No results for input(s): DDIMER in the last 72 hours. Hgb A1c No results for input(s): HGBA1C in the last 72 hours. Lipid Profile No results for input(s): CHOL, HDL, LDLCALC, TRIG, CHOLHDL, LDLDIRECT in the last 72 hours. Thyroid function studies Recent Labs    06/15/18 2015  TSH 0.967   Anemia work up Recent Labs    06/16/18 0104  FERRITIN 9*  TIBC 434  IRON 20*   Urinalysis    Component Value Date/Time   COLORURINE YELLOW 12/09/2014 1330   APPEARANCEUR CLEAR 12/09/2014 1330   LABSPEC 1.026 12/09/2014 1330   PHURINE 5.5 12/09/2014 1330   GLUCOSEU NEGATIVE 12/09/2014 1330   HGBUR NEGATIVE 12/09/2014 1330   BILIRUBINUR NEGATIVE 12/09/2014 1330   KETONESUR 15 (A) 12/09/2014 1330   PROTEINUR 30 (A) 12/09/2014 1330   UROBILINOGEN 0.2 12/09/2014 1330   NITRITE NEGATIVE 12/09/2014 1330   LEUKOCYTESUR NEGATIVE 12/09/2014 1330   Sepsis Labs Invalid input(s): PROCALCITONIN,  WBC,  LACTICIDVEN Microbiology No results found for this or any previous visit (from the  past 240 hour(s)).  Please note: You were cared for by a hospitalist during your hospital stay. Once you are discharged, your primary care physician will handle any further medical issues. Please note that NO REFILLS for any discharge medications will be authorized once you are discharged, as it is imperative that you return to your primary care physician (or establish a relationship with a primary care physician if you do not have one) for your post hospital discharge needs so that they can reassess your need for medications and monitor your lab values.    Time coordinating discharge: 40 minutes  SIGNED:   Shelly Coss, MD  [facility] 06/16/2018, 2:09 PM Pager [phone]  If 7PM-7AM, please contact night-coverage [email or web address] Password [email or web address]

## 2018-06-16 NOTE — Progress Notes (Signed)
Physical Therapy Treatment Patient Details Name: Robert [name] MRN: [id number] DOB: 1932-03-17 Today's Date: 06/16/2018    History of Present Illness 84 yo male with onset of fall on the stairs at home was admitted, noted low K+ which was repleted.  He has complained of dizziness, hitting his head and rib pain.  PMHx:  L wrist OA, a-fib, chest pain from MI, CAD, PUD, PVC's, SBO, PAD, PN, DM    PT Comments    Patient progressing with ambulation this session.  Demonstrated appropriate use of single point cane on level surfaces and on stairs, except one episode of LOB with cane too close to his foot, but able to recover unaided.  On stairs used railing and cane and pt and family confirm he does have a rail on steps at home.  Continue to feel he will benefit from HHPT for further safety education and fall prevention.  Patient reports has both cane and walker at home and confirmed he did not want or feel he needs other DME at home.     Follow Up Recommendations  Home health PT;Supervision - Intermittent     Equipment Recommendations  None recommended by PT    Recommendations for Other Services       Precautions / Restrictions Precautions Precautions: Fall    Mobility  Bed Mobility               General bed mobility comments: up in chair  Transfers Overall transfer level: Modified independent Equipment used: Straight cane                Ambulation/Gait Ambulation/Gait assistance: Supervision Gait Distance (Feet): 200 Feet Assistive device: Straight cane Gait Pattern/deviations: Step-through pattern;Trunk flexed;Decreased stride length     General Gait Details: used cane appropriately in R hand with one epiosde of LOB due to cane too close to foot, self recovery   Stairs Stairs: Yes Stairs assistance: Supervision Stair Management: Step to pattern;Forwards;One rail Right;With cane Number of Stairs: 3 General stair comments: touching rail when turning around in  stairwell, and touching twice when descending steps.  Patient and family in room report he does have a Archivist Rankin (Stroke Patients Only)       Balance Overall balance assessment: Needs assistance   Sitting balance-Leahy Scale: Good       Standing balance-Leahy Scale: Fair                              Cognition Arousal/Alertness: Awake/alert Behavior During Therapy: WFL for tasks assessed/performed Overall Cognitive Status: Within Functional Limits for tasks assessed                                        Exercises      General Comments General comments (skin integrity, edema, etc.): Educated on fall prevention for home with family present; discussed devices at home and he states he has a cane and a walker already; does not feel he needs a new cane at this time.       Pertinent Vitals/Pain Pain Assessment: Faces Pain Score: 4  Pain Location: L wrist chronic pain, mild L rib pain  Pain Descriptors / Indicators: Aching Pain Intervention(s): Monitored during session    Home Living  Prior Function            PT Goals (current goals can now be found in the care plan section) Progress towards PT goals: Progressing toward goals    Frequency    Min 4X/week      PT Plan Current plan remains appropriate;Equipment recommendations need to be updated    Co-evaluation              AM-PAC PT "6 Clicks" Daily Activity  Outcome Measure  Difficulty turning over in bed (including adjusting bedclothes, sheets and blankets)?: A Little Difficulty moving from lying on back to sitting on the side of the bed? : A Little Difficulty sitting down on and standing up from a chair with arms (e.g., wheelchair, bedside commode, etc,.)?: A Little Help needed moving to and from a bed to chair (including a wheelchair)?: A Little Help needed walking in hospital room?: A Little Help  needed climbing 3-5 steps with a railing? : A Little 6 Click Score: 18    End of Session Equipment Utilized During Treatment: Gait belt Activity Tolerance: Patient tolerated treatment well Patient left: with call bell/phone within reach;in chair;with family/visitor present   PT Visit Diagnosis: Unsteadiness on feet (R26.81);History of falling (Z91.81)     Time: 1962-2297 PT Time Calculation (min) (ACUTE ONLY): 19 min  Charges:  $Gait Training: 8-22 mins                     [place], [place] [phone] 06/16/2018    Reginia Naas 06/16/2018, 12:55 PM

## 2018-06-18 ENCOUNTER — Emergency Department (HOSPITAL_COMMUNITY)
Admission: EM | Admit: 2018-06-18 | Discharge: 2018-06-18 | Disposition: A | Discharge status: Home / Self Care | Payer: PPO | Attending: Emergency Medicine | Admitting: Emergency Medicine

## 2018-06-18 ENCOUNTER — Emergency Department (HOSPITAL_COMMUNITY): Payer: PPO

## 2018-06-18 ENCOUNTER — Encounter (HOSPITAL_COMMUNITY): Payer: Self-pay

## 2018-06-18 ENCOUNTER — Other Ambulatory Visit: Payer: Self-pay

## 2018-06-18 DIAGNOSIS — Z79899 Other long term (current) drug therapy: Secondary | ICD-10-CM | POA: Insufficient documentation

## 2018-06-18 DIAGNOSIS — E876 Hypokalemia: Secondary | ICD-10-CM | POA: Insufficient documentation

## 2018-06-18 DIAGNOSIS — I959 Hypotension, unspecified: Secondary | ICD-10-CM | POA: Diagnosis not present

## 2018-06-18 DIAGNOSIS — I2510 Atherosclerotic heart disease of native coronary artery without angina pectoris: Secondary | ICD-10-CM | POA: Insufficient documentation

## 2018-06-18 DIAGNOSIS — E162 Hypoglycemia, unspecified: Secondary | ICD-10-CM

## 2018-06-18 DIAGNOSIS — Z7984 Long term (current) use of oral hypoglycemic drugs: Secondary | ICD-10-CM | POA: Insufficient documentation

## 2018-06-18 DIAGNOSIS — E11649 Type 2 diabetes mellitus with hypoglycemia without coma: Secondary | ICD-10-CM | POA: Insufficient documentation

## 2018-06-18 DIAGNOSIS — R269 Unspecified abnormalities of gait and mobility: Secondary | ICD-10-CM | POA: Diagnosis not present

## 2018-06-18 DIAGNOSIS — N179 Acute kidney failure, unspecified: Secondary | ICD-10-CM

## 2018-06-18 DIAGNOSIS — R410 Disorientation, unspecified: Secondary | ICD-10-CM

## 2018-06-18 DIAGNOSIS — Z87891 Personal history of nicotine dependence: Secondary | ICD-10-CM | POA: Diagnosis not present

## 2018-06-18 DIAGNOSIS — E161 Other hypoglycemia: Secondary | ICD-10-CM | POA: Diagnosis not present

## 2018-06-18 DIAGNOSIS — R4182 Altered mental status, unspecified: Secondary | ICD-10-CM | POA: Diagnosis present

## 2018-06-18 LAB — URINALYSIS, ROUTINE W REFLEX MICROSCOPIC
Bilirubin Urine: NEGATIVE
GLUCOSE, UA: NEGATIVE mg/dL
HGB URINE DIPSTICK: NEGATIVE
KETONES UR: NEGATIVE mg/dL
Leukocytes, UA: NEGATIVE
Nitrite: NEGATIVE
Protein, ur: NEGATIVE mg/dL
Specific Gravity, Urine: 1.003 — ABNORMAL LOW (ref 1.005–1.030)
pH: 6 (ref 5.0–8.0)

## 2018-06-18 LAB — COMPREHENSIVE METABOLIC PANEL
ALT: 11 U/L (ref 0–44)
AST: 20 U/L (ref 15–41)
Albumin: 3.4 g/dL — ABNORMAL LOW (ref 3.5–5.0)
Alkaline Phosphatase: 51 U/L (ref 38–126)
Anion gap: 11 (ref 5–15)
BILIRUBIN TOTAL: 0.8 mg/dL (ref 0.3–1.2)
BUN: 10 mg/dL (ref 8–23)
CO2: 26 mmol/L (ref 22–32)
CREATININE: 1.32 mg/dL — AB (ref 0.61–1.24)
Calcium: 8.9 mg/dL (ref 8.9–10.3)
Chloride: 99 mmol/L (ref 98–111)
GFR, EST AFRICAN AMERICAN: 55 mL/min — AB (ref >60)
GFR, EST NON AFRICAN AMERICAN: 48 mL/min — AB (ref >60)
Glucose, Bld: 87 mg/dL (ref 70–99)
POTASSIUM: 2.9 mmol/L — AB (ref 3.5–5.1)
Sodium: 136 mmol/L (ref 135–145)
TOTAL PROTEIN: 6.5 g/dL (ref 6.5–8.1)

## 2018-06-18 LAB — CBC
HEMATOCRIT: 27.9 % — AB (ref 39.0–52.0)
Hemoglobin: 8.1 g/dL — ABNORMAL LOW (ref 13.0–17.0)
MCH: 21.2 pg — AB (ref 26.0–34.0)
MCHC: 29 g/dL — ABNORMAL LOW (ref 30.0–36.0)
MCV: 73 fL — AB (ref 78.0–100.0)
Platelets: 305 10*3/uL (ref 150–400)
RBC: 3.82 MIL/uL — ABNORMAL LOW (ref 4.22–5.81)
RDW: 18.5 % — AB (ref 11.5–15.5)
WBC: 5.1 10*3/uL (ref 4.0–10.5)

## 2018-06-18 LAB — CBG MONITORING, ED: Glucose-Capillary: 95 mg/dL (ref 70–99)

## 2018-06-18 LAB — AMMONIA: Ammonia: 18 umol/L (ref 9–35)

## 2018-06-18 MED ORDER — POTASSIUM CHLORIDE CRYS ER 20 MEQ PO TBCR
40.0000 meq | EXTENDED_RELEASE_TABLET | Freq: Once | ORAL | Status: AC
  Administered 2018-06-18: 40 meq via ORAL
  Filled 2018-06-18: qty 2

## 2018-06-18 MED ORDER — POTASSIUM CHLORIDE ER 10 MEQ PO TBCR: 20.0000 meq | EXTENDED_RELEASE_TABLET | Freq: Every day | ORAL | 0 refills | Status: DC

## 2018-06-18 MED ORDER — LACTATED RINGERS IV BOLUS
1000.0000 mL | Freq: Once | INTRAVENOUS | Status: AC
  Administered 2018-06-18: 1000 mL via INTRAVENOUS

## 2018-06-18 NOTE — ED Notes (Signed)
Patient verbalizes understanding of discharge instructions. Opportunity for questioning and answers were provided. Armband removed by staff, pt discharged from ED.  

## 2018-06-18 NOTE — ED Notes (Signed)
Patient transported to CT 

## 2018-06-18 NOTE — ED Triage Notes (Signed)
Pt brought in by [facility] from church for confusion and abnormal gait. LNW 1200, per family pt went through his normal routine this am, but noticed at church that he was "walking funny" and not answering questions appropriately. Pt was seen earlier this week for a fall, per EMS pt electrolytes were abnormal. On EMS arrival pt was not able to follow commands, pt has an appointment with a neurologist next week for a "dementia assessment". Pt initial CBG 72- given 15g oral glucose, last CBG 85. EMS noted improvement in pt mentation following admin of oral glucose. Pt A+Ox2 on arrival.

## 2018-06-18 NOTE — Discharge Instructions (Signed)
Home medications as previously prescribed.  Take potassium as prescribed for the next 5 days.  Return to the emergency department if symptoms worsen, numbness, weakness or other concerning symptoms.  Follow-up with your family doctor in 3-5 days for reevaluation and recheck of kidney function and potassium level.

## 2018-06-18 NOTE — ED Provider Notes (Signed)
[facility] EMERGENCY DEPARTMENT Provider Note   CSN: [id number] Arrival date & time: 06/18/18  1503  History   Chief Complaint Chief Complaint  Patient presents with  . Altered Mental Status    HPI Robert [name] is a 84 y.o. male.  HPI Pt is an 89yoM w/hx of Afib not on AC, CAD on plavix, DM, HTN, HLD, PAD who presents to the ED for evaluation of AMS. He is accompanied by his sister and daughter who report that he walked in to church "a little wobbly" and describe grogginess and that he slept through most of church which is not atypical for him. Today and when patient stood up to leave church "he was real wobbly and had to sit back down" and they report "he was talkin out of his head, about old things that have happened." He attempted to stand once more but was "wobbly" again so family called 911. EMS reports pt was initially not following commands and delayed in answering questions. BG of 72 improved to 85 with oral glucose. Has since returned to his baseline by time of my eval according to family. Was evaluated in ED 2d ago for fall and had CTH with NAICA at that time. Has since been acting and behaving normally including this morning when he got ready and drove himself to church. They state they are trying to get him to stop driving and have plans to see neurologist next week for dementia assessment. Lives with daughter.    Past Medical History:  Diagnosis Date  . Atrial fibrillation ([place])   . CAD (coronary artery disease)    DES to Cfx  . Diabetes mellitus without complication ([place])   . Fall at home 06/15/2018  . History of nuclear stress test 02/2012   lexiscan; mild-mod perfusion defect in basal inferoseptal, mid inferoseptal apical anterior; no change from previous study, abnormal test   . Hyperlipidemia   . Hypertension   . Myocardial infarction ([place]) 1980s  . PAD (peripheral artery disease) ([place])    aortic stent graft 2008  . PUD (peptic ulcer disease)     . Small bowel obstruction ([place]) 10/2011  . Ulcer     Patient Active Problem List   Diagnosis Date Noted  . Fall 06/15/2018  . BPH (benign prostatic hyperplasia) 06/15/2018  . Microcytic hypochromic anemia 06/15/2018  . Hypokalemia 06/15/2018  . Abnormal EKG 06/15/2018  . Chest pain 08/31/2017  . Other fatigue 06/23/2016  . Frequent PVCs 06/23/2016  . Symptomatic bradycardia 05/20/2015  . Pain of left lower extremity-Left Hip/Leg 06/04/2014  . Numbness-Bilat Leg 06/04/2014  . Weakness of both legs 06/04/2014  . Aftercare following surgery of the circulatory system, [place] 10/02/2013  . AAA (abdominal aortic aneurysm) ([place]) 07/18/2012  . Aneurysm of iliac artery ([place]) 07/18/2012  . Renal mass, left 11/07/2011  . SBO (small bowel obstruction) ([place]) 11/07/2011  . Hyperkalemia 11/07/2011  . CAD (coronary artery disease)   . Inferior MI ([place])   . Hyperlipidemia   . Essential hypertension   . Ulcer-s/p Gastrectomy 40 yr ago     Past Surgical History:  Procedure Laterality Date  . CARDIAC CATHETERIZATION  1987   occluded marginal branch of RCA, treated medically - Dr Melvern Banker  . CORONARY ANGIOPLASTY WITH STENT PLACEMENT  03/2003   2.5x78mm Taxus DES to Cfx  . ESOPHAGOGASTRODUODENOSCOPY N/A 03/14/2014   Procedure: ESOPHAGOGASTRODUODENOSCOPY (EGD) with Balloon Dilation;  Surgeon: Garlan Fair, MD;  Location: [facility] ENDOSCOPY;  Service: Endoscopy;  Laterality:  N/A;  . EYE SURGERY Right    cataract surgery  . GASTRECTOMY  50 yrs ago - 2 operations   had surgery at the age of 30 for the ulcer  . ILIAC ARTERY ANEURYSM REPAIR     Repaied by Dr. Kellie Simmering april 2009  . JOINT REPLACEMENT Left    hip replacement  . TRANSTHORACIC ECHOCARDIOGRAM  11/2009   EF=>55%, mild conc LVH, normal LV systolic function; mild MR; mild-mod TR with RV systolic pressure elevated at 30-81mmHg; mild-mod AV regurg; mild pulm valve regurg        Home Medications    Prior to Admission medications   Medication  Sig Start Date End Date Taking? Authorizing Provider  brimonidine (ALPHAGAN P) 0.1 % SOLN Place 1 drop into both eyes daily.    Yes [provider]  clopidogrel (PLAVIX) 75 MG tablet Take 75 mg by mouth daily.     Yes [provider]  finasteride (PROSCAR) 5 MG tablet Take 5 mg by mouth daily.     Yes [provider]  furosemide (LASIX) 40 MG tablet Take 40 mg by mouth daily.  08/29/14  Yes [provider]  latanoprost (XALATAN) 0.005 % ophthalmic solution Place 1 drop into the left eye at bedtime.    Yes [provider]  lisinopril (PRINIVIL,ZESTRIL) 20 MG tablet TAKE 1 TABLET BY MOUTH EVERY DAY Patient taking differently: Take 20 mg by mouth daily.  05/29/18  Yes Leonie Man, MD  metFORMIN (GLUCOPHAGE) 500 MG tablet Take 500 mg by mouth 2 (two) times daily. 09/04/15  Yes [provider]  simvastatin (ZOCOR) 40 MG tablet Take 40 mg by mouth daily.  08/29/14  Yes [provider]  traMADol (ULTRAM) 50 MG tablet Take 50 mg by mouth every 6 (six) hours as needed (pain).  06/10/18  Yes [provider]  ferrous sulfate 325 (65 FE) MG tablet Take 1 tablet (325 mg total) by mouth daily with breakfast. 06/17/18   Shelly Coss, MD  HYDROcodone-acetaminophen (NORCO/VICODIN) 5-325 MG tablet Take 1-2 tablets by mouth every 4 (four) hours as needed. Patient not taking: Reported on 06/15/2018 04/11/17   Malvin Johns, MD  potassium chloride (K-DUR) 10 MEQ tablet Take 2 tablets (20 mEq total) by mouth daily for 5 days. 06/18/18 06/23/18  Corrie Dandy, MD    Family History Family History  Problem Relation Age of Onset  . Stroke Father   . Heart disease Father   . Hypertension Father   . Heart disease Daughter        Before age 57  . Diabetes Sister        DVT-Blood clots in vein    Social History Social History   Tobacco Use  . Smoking status: Former Smoker    Years: 0.50    Types: Cigarettes    Last attempt to quit:  07/18/1992    Years since quitting: 25.9  . Smokeless tobacco: Never Used  . Tobacco comment: quit around HA time  Substance Use Topics  . Alcohol use: No  . Drug use: No     Allergies   Asa w/codeine [aspirin-codeine]   Review of Systems Review of Systems  Constitutional: Negative for chills and fever.  HENT: Negative for congestion.   Respiratory: Negative for cough and shortness of breath.   Cardiovascular: Negative for chest pain and leg swelling.  Gastrointestinal: Negative for abdominal pain, diarrhea, nausea and vomiting.  Genitourinary: Negative for dysuria and hematuria.  Musculoskeletal: Positive for arthralgias (  left wrist (negative xray 2d ago)) and gait problem (wobbly). Negative for back pain and neck pain.  Skin: Negative for rash.  Neurological: Negative for dizziness, weakness, numbness and headaches.  Psychiatric/Behavioral: Positive for confusion.  All other systems reviewed and are negative.    Physical Exam Updated Vital Signs BP (!) 157/75   Pulse (!) 59   Temp 98.5 F (36.9 C) (Oral)   Resp 14   Ht 5\' 11"  (1.803 m)   Wt 63.9 kg   SpO2 100%   BMI 19.65 kg/m   Physical Exam  Constitutional: No distress.  elderly  HENT:  Head: Normocephalic and atraumatic.  Eyes: Conjunctivae are normal. Right eye exhibits no discharge. Left eye exhibits no discharge.  Neck: Normal range of motion. No tracheal deviation present.  Cardiovascular: Regular rhythm, normal heart sounds and intact distal pulses. Exam reveals no friction rub.  No murmur heard. Pulmonary/Chest: Effort normal and breath sounds normal. No respiratory distress.  Abdominal: Soft. Bowel sounds are normal. He exhibits no distension. There is no tenderness.  Musculoskeletal: He exhibits no edema or deformity.  No midline CTL spine pain  Neurological: He is alert. He exhibits normal muscle tone.  Oriented, self, Rudene Anda, place. Moving all extremities. Cranial nerves II-XII intact.  No  facial asymmetry.  5/5 grip strength bilaterally.  5/5 bicep flexion and tricep extension bilaterally. 5/5 flexion/extension at knee, hip, and ankles. Ambulates without ataxia or antalgic gait.    Skin: No rash noted. He is not diaphoretic.  Psychiatric: He has a normal mood and affect.  Nursing note and vitals reviewed.  ED Treatments / Results  Labs (all labs ordered are listed, but only abnormal results are displayed) Labs Reviewed  CBC - Abnormal; Notable for the following components:      Result Value   RBC 3.82 (*)    Hemoglobin 8.1 (*)    HCT 27.9 (*)    MCV 73.0 (*)    MCH 21.2 (*)    MCHC 29.0 (*)    RDW 18.5 (*)    All other components within normal limits  COMPREHENSIVE METABOLIC PANEL - Abnormal; Notable for the following components:   Potassium 2.9 (*)    Creatinine, Ser 1.32 (*)    Albumin 3.4 (*)    GFR calc non Af Amer 48 (*)    GFR calc Af Amer 55 (*)    All other components within normal limits  URINALYSIS, ROUTINE W REFLEX MICROSCOPIC - Abnormal; Notable for the following components:   Specific Gravity, Urine 1.003 (*)    All other components within normal limits  AMMONIA  CBG MONITORING, ED    EKG EKG Interpretation  Date/Time:  Sunday June 18 2018 15:19:56 EDT Ventricular Rate:  65 PR Interval:    QRS Duration: 104 QT Interval:  414 QTC Calculation: 431 R Axis:   -39 Text Interpretation:  Sinus rhythm Prolonged PR interval Incomplete RBBB and LAFB Probable anteroseptal infarct, old Repol abnrm suggests ischemia, lateral leads No significant change since last tracing Confirmed by Blanchie Dessert [phone]) on 06/18/2018 3:42:58 PM   Radiology Ct Head Wo Contrast  Result Date: 06/18/2018 CLINICAL DATA:  Confusion, abnormal gait, walking finding and not answering questions appropriately at church, fell earlier in week EXAM: CT HEAD WITHOUT CONTRAST TECHNIQUE: Contiguous axial images were obtained from the base of the skull through the vertex without  intravenous contrast. Sagittal and coronal MPR images reconstructed from axial data set. COMPARISON:  06/15/2018 FINDINGS: Brain: Generalized atrophy. Normal ventricular  morphology. No midline shift or mass effect. Small vessel chronic ischemic changes of deep cerebral white matter. Again identified benign-appearing basal ganglia calcifications as well as deep calcifications in the cerebellar hemispheres, unchanged. No intracranial hemorrhage, mass lesion, evidence of acute infarction, or extra-axial fluid collection. Vascular: Atherosclerotic calcification of internal carotid and vertebral arteries at skull base Skull: Intact.  Scalp hematoma lateral to LEFT orbit. Sinuses/Orbits: Clear Other: N/A IMPRESSION: Atrophy with small vessel chronic ischemic changes of deep cerebral white matter. No acute intracranial abnormalities. Electronically Signed   By: Lavonia Dana M.D.   On: 06/18/2018 17:46    Procedures Procedures (including critical care time)  Medications Ordered in ED Medications  potassium chloride SA (K-DUR,KLOR-CON) CR tablet 40 mEq (40 mEq Oral Given 06/18/18 1736)  lactated ringers bolus 1,000 mL (1,000 mLs Intravenous New Bag/Given 06/18/18 1733)     Initial Impression / Assessment and Plan / ED Course  I have reviewed the triage vital signs and the nursing notes.  Pertinent labs & imaging results that were available during my care of the patient were reviewed by me and considered in my medical decision making (see chart for details).    Pt is an 28yoM w/hx of Afib not on AC, CAD on plavix, DM, HTN, HLD, PAD who presents to the ED for evaluation of AMS though has since returned to his baseline. No new pain. Does have persitent left wrist pain after fall 2d ago with xrays that were unremarkable.   Pt afeb, HDS, no distress. Exam as above. No focal neuro deficits. Do not think this is consistent with CVA/TIA. Denies CP/SOA/palpitations. ECG reviewed with rate approx 65, NSR, normal  axis, 1st deg AV block, remaining intervals wnl, Does have TWIs in leads I, II, AVL, V4-V6 which is similar to prior ECG from 8/22 and March 30th 2018. CT head, xr left wrist and chest obtained 8/22 with no traumatic findings. No neck pain today. No numbness or weakness. As he is on plavix, will recheck CT head to evaluate for delayed bleed given recent fall. Could also be med induced as pt still manages all of his home medications. No infectious signs or symptoms. Will check urine to eval for occult UTI as etiology. Has also recently been anemic. Will recheck CBC and electrolytes and ammonia. Anticipate if workup is unremarkable then can likely be discharged to home.   Labs reviewed.  Patient does have hypokalemia.  Repleted p.o. in the ED.  He also does have mild AKI when compared to prior labs from 8/22 visit.  Creatinine is now 1.3 up from 0.8 baseline.  IV fluids given.  He has persistent anemia with no acute drop when compared to 8/23 labs.  Denies hematochezia or melena.  Iron studies reveal mild iron deficiency with low iron level and low ferritin level.  CT Head NAICA in setting of recent fall. Patient able to ambulate in the emergency department without ataxia or antalgic gait.  He answers questions appropriately.  He has been monitored in the ED for couple hours with no acute change in his mental status.  At his baseline according to daughter and niece who are not bedside.  Stable for discharge.  Advised to follow-up with his PCP for this.  Will discharge on short course of potassium and advised to follow-up closely with PCP this week for reevaluation of renal function and potassium.  Strict return precautions provided.  Stable at discharge.  Case and plan of care discussed with Dr. Maryan Rued.  Final Clinical Impressions(s) / ED Diagnoses   Final diagnoses:  Confusion  Hypoglycemia  AKI (acute kidney injury) ([place])  Hypokalemia    ED Discharge Orders         Ordered    potassium chloride  (K-DUR) 10 MEQ tablet  Daily     06/18/18 1855           Corrie Dandy, MD 06/18/18 Aggie Moats, MD 06/19/18 [phone]

## 2018-06-19 NOTE — Consult Note (Signed)
            [facility] CM Primary Care Navigator  06/19/2018  Shooter Tangen 1932/05/26 [id number]    Attempt to seepatient at the bedside to identify possible discharge needs buthe wasalready discharged. Patient was discharged home with home health services per In patient CM note.  Per MD note, patient was admitted due to fall (slipped off 3 steps) and sustained head contusion,left side rib pain, andleft wristsprain with possibility of syncope (anemia).   Patient has discharge instruction to follow-up withprimary care provider in 1 week and gastroenterology follow-up in 4 weeks.  Primary care provider's office is listed as providing transition of care (TOC) follow-up.   For additional questions please contact:  Edwena Felty A. Dorthy Magnussen, BSN, RN-BC [facility] PRIMARY CARE Navigator Cell: [phone]

## 2018-06-22 DIAGNOSIS — E1151 Type 2 diabetes mellitus with diabetic peripheral angiopathy without gangrene: Secondary | ICD-10-CM | POA: Diagnosis not present

## 2018-06-22 DIAGNOSIS — W19XXXD Unspecified fall, subsequent encounter: Secondary | ICD-10-CM | POA: Diagnosis not present

## 2018-06-22 DIAGNOSIS — R410 Disorientation, unspecified: Secondary | ICD-10-CM | POA: Diagnosis not present

## 2018-06-22 DIAGNOSIS — E876 Hypokalemia: Secondary | ICD-10-CM | POA: Diagnosis not present

## 2018-06-22 DIAGNOSIS — D508 Other iron deficiency anemias: Secondary | ICD-10-CM | POA: Diagnosis not present

## 2018-07-05 DIAGNOSIS — N403 Nodular prostate with lower urinary tract symptoms: Secondary | ICD-10-CM | POA: Diagnosis not present

## 2018-07-05 DIAGNOSIS — R350 Frequency of micturition: Secondary | ICD-10-CM | POA: Diagnosis not present

## 2018-07-31 DIAGNOSIS — R350 Frequency of micturition: Secondary | ICD-10-CM | POA: Diagnosis not present

## 2018-07-31 DIAGNOSIS — N403 Nodular prostate with lower urinary tract symptoms: Secondary | ICD-10-CM | POA: Diagnosis not present

## 2018-08-02 DIAGNOSIS — H401131 Primary open-angle glaucoma, bilateral, mild stage: Secondary | ICD-10-CM | POA: Diagnosis not present

## 2018-08-02 DIAGNOSIS — H35372 Puckering of macula, left eye: Secondary | ICD-10-CM | POA: Diagnosis not present

## 2018-08-02 DIAGNOSIS — H43813 Vitreous degeneration, bilateral: Secondary | ICD-10-CM | POA: Diagnosis not present

## 2018-08-02 DIAGNOSIS — H25811 Combined forms of age-related cataract, right eye: Secondary | ICD-10-CM | POA: Diagnosis not present

## 2018-08-03 DIAGNOSIS — Z23 Encounter for immunization: Secondary | ICD-10-CM | POA: Diagnosis not present

## 2018-08-03 DIAGNOSIS — D508 Other iron deficiency anemias: Secondary | ICD-10-CM | POA: Diagnosis not present

## 2018-08-07 DIAGNOSIS — H409 Unspecified glaucoma: Secondary | ICD-10-CM | POA: Diagnosis not present

## 2018-08-07 DIAGNOSIS — I10 Essential (primary) hypertension: Secondary | ICD-10-CM | POA: Diagnosis not present

## 2018-08-07 DIAGNOSIS — I2510 Atherosclerotic heart disease of native coronary artery without angina pectoris: Secondary | ICD-10-CM | POA: Diagnosis not present

## 2018-08-07 DIAGNOSIS — D508 Other iron deficiency anemias: Secondary | ICD-10-CM | POA: Diagnosis not present

## 2018-08-07 DIAGNOSIS — J4520 Mild intermittent asthma, uncomplicated: Secondary | ICD-10-CM | POA: Diagnosis not present

## 2018-08-07 DIAGNOSIS — Z7984 Long term (current) use of oral hypoglycemic drugs: Secondary | ICD-10-CM | POA: Diagnosis not present

## 2018-08-07 DIAGNOSIS — E1151 Type 2 diabetes mellitus with diabetic peripheral angiopathy without gangrene: Secondary | ICD-10-CM | POA: Diagnosis not present

## 2018-08-07 DIAGNOSIS — N400 Enlarged prostate without lower urinary tract symptoms: Secondary | ICD-10-CM | POA: Diagnosis not present

## 2018-08-28 DIAGNOSIS — Z961 Presence of intraocular lens: Secondary | ICD-10-CM | POA: Diagnosis not present

## 2018-08-28 DIAGNOSIS — H35033 Hypertensive retinopathy, bilateral: Secondary | ICD-10-CM | POA: Diagnosis not present

## 2018-08-28 DIAGNOSIS — H43813 Vitreous degeneration, bilateral: Secondary | ICD-10-CM | POA: Diagnosis not present

## 2018-08-28 DIAGNOSIS — H35372 Puckering of macula, left eye: Secondary | ICD-10-CM | POA: Diagnosis not present

## 2018-09-23 ENCOUNTER — Other Ambulatory Visit: Payer: Self-pay | Admitting: Cardiology

## 2018-11-06 DIAGNOSIS — H401131 Primary open-angle glaucoma, bilateral, mild stage: Secondary | ICD-10-CM | POA: Diagnosis not present

## 2018-11-06 DIAGNOSIS — H2513 Age-related nuclear cataract, bilateral: Secondary | ICD-10-CM | POA: Diagnosis not present

## 2018-11-06 DIAGNOSIS — Z961 Presence of intraocular lens: Secondary | ICD-10-CM | POA: Diagnosis not present

## 2018-11-06 DIAGNOSIS — H35372 Puckering of macula, left eye: Secondary | ICD-10-CM | POA: Diagnosis not present

## 2018-11-06 DIAGNOSIS — H25811 Combined forms of age-related cataract, right eye: Secondary | ICD-10-CM | POA: Diagnosis not present

## 2018-11-06 DIAGNOSIS — H35033 Hypertensive retinopathy, bilateral: Secondary | ICD-10-CM | POA: Diagnosis not present

## 2018-11-06 DIAGNOSIS — H2511 Age-related nuclear cataract, right eye: Secondary | ICD-10-CM | POA: Diagnosis not present

## 2018-11-14 DIAGNOSIS — H268 Other specified cataract: Secondary | ICD-10-CM | POA: Diagnosis not present

## 2018-11-14 DIAGNOSIS — H2511 Age-related nuclear cataract, right eye: Secondary | ICD-10-CM | POA: Diagnosis not present

## 2018-11-14 DIAGNOSIS — H25811 Combined forms of age-related cataract, right eye: Secondary | ICD-10-CM | POA: Diagnosis not present

## 2018-12-19 DIAGNOSIS — H6123 Impacted cerumen, bilateral: Secondary | ICD-10-CM | POA: Diagnosis not present

## 2018-12-19 DIAGNOSIS — E1151 Type 2 diabetes mellitus with diabetic peripheral angiopathy without gangrene: Secondary | ICD-10-CM | POA: Diagnosis not present

## 2018-12-19 DIAGNOSIS — M5432 Sciatica, left side: Secondary | ICD-10-CM | POA: Diagnosis not present

## 2018-12-19 DIAGNOSIS — E7800 Pure hypercholesterolemia, unspecified: Secondary | ICD-10-CM | POA: Diagnosis not present

## 2018-12-19 DIAGNOSIS — I2510 Atherosclerotic heart disease of native coronary artery without angina pectoris: Secondary | ICD-10-CM | POA: Diagnosis not present

## 2018-12-19 DIAGNOSIS — H9192 Unspecified hearing loss, left ear: Secondary | ICD-10-CM | POA: Diagnosis not present

## 2018-12-19 DIAGNOSIS — Z1389 Encounter for screening for other disorder: Secondary | ICD-10-CM | POA: Diagnosis not present

## 2018-12-19 DIAGNOSIS — N400 Enlarged prostate without lower urinary tract symptoms: Secondary | ICD-10-CM | POA: Diagnosis not present

## 2018-12-19 DIAGNOSIS — E538 Deficiency of other specified B group vitamins: Secondary | ICD-10-CM | POA: Diagnosis not present

## 2018-12-19 DIAGNOSIS — I10 Essential (primary) hypertension: Secondary | ICD-10-CM | POA: Diagnosis not present

## 2018-12-19 DIAGNOSIS — Z0000 Encounter for general adult medical examination without abnormal findings: Secondary | ICD-10-CM | POA: Diagnosis not present

## 2018-12-19 DIAGNOSIS — D508 Other iron deficiency anemias: Secondary | ICD-10-CM | POA: Diagnosis not present

## 2018-12-19 DIAGNOSIS — I739 Peripheral vascular disease, unspecified: Secondary | ICD-10-CM | POA: Diagnosis not present

## 2019-01-01 ENCOUNTER — Other Ambulatory Visit: Payer: Self-pay | Admitting: Internal Medicine

## 2019-01-02 ENCOUNTER — Other Ambulatory Visit: Payer: Self-pay | Admitting: Internal Medicine

## 2019-02-06 ENCOUNTER — Other Ambulatory Visit: Payer: Self-pay | Admitting: Internal Medicine

## 2019-02-07 NOTE — Telephone Encounter (Signed)
Dispensed 3 month supply - will need follow-up appointment with me in 3 months

## 2019-03-01 ENCOUNTER — Other Ambulatory Visit: Payer: Self-pay | Admitting: Internal Medicine

## 2019-03-08 ENCOUNTER — Other Ambulatory Visit: Payer: Self-pay | Admitting: Internal Medicine

## 2019-03-08 DIAGNOSIS — R221 Localized swelling, mass and lump, neck: Secondary | ICD-10-CM | POA: Diagnosis not present

## 2019-03-14 ENCOUNTER — Ambulatory Visit
Admission: RE | Admit: 2019-03-14 | Discharge: 2019-03-14 | Disposition: A | Discharge status: Home / Self Care | Payer: PPO | Source: Ambulatory Visit | Attending: Internal Medicine | Admitting: Internal Medicine

## 2019-03-14 DIAGNOSIS — R221 Localized swelling, mass and lump, neck: Secondary | ICD-10-CM

## 2019-05-14 ENCOUNTER — Ambulatory Visit: Payer: PPO | Admitting: Internal Medicine

## 2019-08-29 ENCOUNTER — Other Ambulatory Visit: Payer: Self-pay | Admitting: Internal Medicine

## 2019-08-29 ENCOUNTER — Other Ambulatory Visit: Payer: Self-pay

## 2019-08-29 MED ORDER — LISINOPRIL 20 MG PO TABS: ORAL_TABLET | ORAL | 0 refills | Status: DC

## 2019-08-30 ENCOUNTER — Telehealth (INDEPENDENT_AMBULATORY_CARE_PROVIDER_SITE_OTHER): Payer: PPO | Admitting: Adult Health

## 2019-08-30 ENCOUNTER — Encounter: Payer: Self-pay | Admitting: Adult Health

## 2019-08-30 VITALS — BP 146/98 | HR 89 | Ht 71.0 in | Wt 134.0 lb

## 2019-08-30 DIAGNOSIS — I2510 Atherosclerotic heart disease of native coronary artery without angina pectoris: Secondary | ICD-10-CM

## 2019-08-30 DIAGNOSIS — E1159 Type 2 diabetes mellitus with other circulatory complications: Secondary | ICD-10-CM

## 2019-08-30 DIAGNOSIS — I10 Essential (primary) hypertension: Secondary | ICD-10-CM

## 2019-08-30 DIAGNOSIS — E7800 Pure hypercholesterolemia, unspecified: Secondary | ICD-10-CM

## 2019-08-30 DIAGNOSIS — I714 Abdominal aortic aneurysm, without rupture, unspecified: Secondary | ICD-10-CM

## 2019-08-30 MED ORDER — LISINOPRIL 20 MG PO TABS: ORAL_TABLET | ORAL | 3 refills | Status: DC

## 2019-08-30 MED ORDER — FUROSEMIDE 40 MG PO TABS: 40.0000 mg | ORAL_TABLET | Freq: Every day | ORAL | 3 refills | Status: DC

## 2019-08-30 MED ORDER — CLOPIDOGREL BISULFATE 75 MG PO TABS: 75.0000 mg | ORAL_TABLET | Freq: Every day | ORAL | 3 refills | Status: DC

## 2019-08-30 MED ORDER — POTASSIUM CHLORIDE ER 10 MEQ PO TBCR: 20.0000 meq | EXTENDED_RELEASE_TABLET | Freq: Every day | ORAL | 3 refills | Status: DC

## 2019-08-30 NOTE — Patient Instructions (Signed)
Medication Instructions:  Continue current medications  *If you need a refill on your cardiac medications before your next appointment, please call your pharmacy*  Lab Work: None Ordered  Testing/Procedures: None Ordered  Follow-Up: At [facility], you and your health needs are our priority.  As part of our continuing mission to provide you with exceptional heart care, we have created designated Provider Care Teams.  These Care Teams include your primary Cardiologist (physician) and Advanced Practice Providers (APPs -  Physician Assistants and Nurse Practitioners) who all work together to provide you with the care you need, when you need it.  Your next appointment:   4 months  The format for your next appointment:   In Person  Provider:   K. Mali Hilty, MD

## 2019-08-30 NOTE — Progress Notes (Signed)
Virtual Visit via Telephone Note   This visit type was conducted due to national recommendations for restrictions regarding the COVID-19 Pandemic (e.g. social distancing) in an effort to limit this patient's exposure and mitigate transmission in our community.  Due to his co-morbid illnesses, this patient is at least at moderate risk for complications without adequate follow up.  This format is felt to be most appropriate for this patient at this time.  The patient did not have access to video technology/had technical difficulties with video requiring transitioning to audio format only (telephone).  All issues noted in this document were discussed and addressed.  No physical exam could be performed with this format.  Please refer to the patient's chart for his  consent to telehealth for [facility].   Date:  08/30/2019   ID:  Robert [name], DOB 26-Jul-1932, MRN [id number]  Patient Location: Home Provider Location: Home  PCP:  Lavone Orn, MD  Cardiologist: Dr. Debara Pickett  Electrophysiologist:  None   Evaluation Performed:  Follow-Up Visit  Chief Complaint:  Follow Up  History of Present Illness:    Robert [name] is a 85 y.o. male we are following with a history of coronary artery disease, with inferior MI in 92s with RCA that was chronically occluded on cath, left circumflex stent was placed in 2004.  He also has a history of AAA status post EVAR in 2008, hypertension, hyperlipidemia, and atrial fibrillation not on anticoagulation therapy.   He was seen in the ED on 08/31/2017 and  was found to have some SVT however this was not documented on his ECG.  He was also found to have diffuse ST depressions in the anterior septal leads similar to infarct pattern.  The patient underwent a Lexiscan Myoview with results revealing low risk.  Echocardiogram revealed normal systolic function with grade 1 diastolic dysfunction with no wall motion abnormalities.  He was continued on Plavix, lisinopril  and Zocor.  Of note, his daughter with whom he lives, reported that he has had episodes of confusion but has independent ADLs.  He also was not eating well and had lost approximately 20 pounds over the last 6 months but was asymptomatic from this.  The patient was admitted to the hospital in August 2019 after a fall, but was not found to have any internal bleeding on Plavix.  He was recommended for home physical therapy at that time.  He was last seen in the office by Dr. Debara Pickett on 09/02/2017, at that time no medication changes were made and no further testing was recommended.  He did note that his echo showed mild to moderate AI and this will need to be followed.  [name] is "seen" via telemedicine through his daughter Robert [name].  She is his main caretaker and make sure that he takes his medications every day, she is very attentive, and keeps up with his medications and blood pressure.  She requires refills on his antihypertensives today.  She states that he is been doing well, he has a lady friend, he eats well, he has not had any further falling, there is been no bleeding issues, no confusion.  He is very hard of hearing, but otherwise she states that he is "his usual self".  They are practicing Covid restrictions and social distancing.  The patient does not have symptoms concerning for COVID-19 infection (fever, chills, cough, or new shortness of breath).    Past Medical History:  Diagnosis Date   Atrial fibrillation ([place])  CAD (coronary artery disease)    DES to Cfx   Diabetes mellitus without complication ([place])    Fall at home 06/15/2018   History of nuclear stress test 02/2012   lexiscan; mild-mod perfusion defect in basal inferoseptal, mid inferoseptal apical anterior; no change from previous study, abnormal test    Hyperlipidemia    Hypertension    Myocardial infarction [facility]) 1980s   PAD (peripheral artery disease) ([place])    aortic stent graft 2008   PUD (peptic ulcer  disease)    Small bowel obstruction ([place]) 10/2011   Ulcer    Past Surgical History:  Procedure Laterality Date   CARDIAC CATHETERIZATION  1987   occluded marginal branch of RCA, treated medically - Dr Melvern Banker   CORONARY ANGIOPLASTY WITH STENT PLACEMENT  03/2003   2.5x28mm Taxus DES to Cfx   ESOPHAGOGASTRODUODENOSCOPY N/A 03/14/2014   Procedure: ESOPHAGOGASTRODUODENOSCOPY (EGD) with Balloon Dilation;  Surgeon: Garlan Fair, MD;  Location: [facility];  Service: Endoscopy;  Laterality: N/A;   EYE SURGERY Right    cataract surgery   GASTRECTOMY  50 yrs ago - 2 operations   had surgery at the age of 9 for the ulcer   ILIAC ARTERY ANEURYSM REPAIR     Repaied by Dr. Kellie Simmering april 2009   JOINT REPLACEMENT Left    hip replacement   TRANSTHORACIC ECHOCARDIOGRAM  11/2009   EF=>55%, mild conc LVH, normal LV systolic function; mild MR; mild-mod TR with RV systolic pressure elevated at 30-74mmHg; mild-mod AV regurg; mild pulm valve regurg     Current Meds  Medication Sig   brimonidine (ALPHAGAN P) 0.1 % SOLN Place 1 drop into both eyes daily.    clopidogrel (PLAVIX) 75 MG tablet Take 1 tablet (75 mg total) by mouth daily.   furosemide (LASIX) 40 MG tablet Take 1 tablet (40 mg total) by mouth daily.   latanoprost (XALATAN) 0.005 % ophthalmic solution Place 1 drop into the left eye at bedtime.    lisinopril (ZESTRIL) 20 MG tablet TAKE 1 TABLET(20 MG) BY MOUTH DAILY   metFORMIN (GLUCOPHAGE) 500 MG tablet Take 500 mg by mouth 2 (two) times daily.   potassium chloride (KLOR-CON) 10 MEQ tablet Take 2 tablets (20 mEq total) by mouth daily.   tamsulosin (FLOMAX) 0.4 MG CAPS capsule Take 1 capsule by mouth daily.   [DISCONTINUED] clopidogrel (PLAVIX) 75 MG tablet Take 75 mg by mouth daily.     [DISCONTINUED] furosemide (LASIX) 40 MG tablet Take 40 mg by mouth daily.    [DISCONTINUED] lisinopril (ZESTRIL) 20 MG tablet TAKE 1 TABLET(20 MG) BY MOUTH DAILY   [DISCONTINUED] potassium  chloride (K-DUR) 10 MEQ tablet Take 2 tablets (20 mEq total) by mouth daily for 5 days.     Allergies:   Asa w/codeine [aspirin-codeine]   Social History   Tobacco Use   Smoking status: Former Smoker    Years: 0.50    Types: Cigarettes    Quit date: 07/18/1992    Years since quitting: 27.1   Smokeless tobacco: Never Used   Tobacco comment: quit around HA time  Substance Use Topics   Alcohol use: No   Drug use: No     Family Hx: The patient's family history includes Diabetes in his sister; Heart disease in his daughter and father; Hypertension in his father; Stroke in his father.  ROS:   Please see the history of present illness.    All other systems reviewed and are negative.   Prior CV studies:  The following studies were reviewed today:  Echocardiogram 09/01/2017 Left ventricle: The cavity size was normal. Systolic function was   normal. The estimated ejection fraction was in the range of 55%   to 60%. Wall motion was normal; there were no regional wall   motion abnormalities. Doppler parameters are consistent with   abnormal left ventricular relaxation (grade 1 diastolic   dysfunction). - Aortic valve: There was mild to moderate regurgitation directed   centrally in the LVOT. - Right atrium: The atrium was mildly dilated.  Doppler Ultrasounds 03/14/2019 IMPRESSION: 1. The patient's palpable area of concern correlates with the left carotid bulb which does not appear ectatic or aneurysmal. 2. Moderate amount of bilateral atherosclerotic plaque, right subjectively greater than left, not resulting in a hemodynamically significant stenosis within either internal carotid artery.  Labs/Other Tests and Data Reviewed:    EKG:  No ECG reviewed.  Recent Labs: No results found for requested labs within last 8760 hours.   Recent Lipid Panel Lab Results  Component Value Date/Time   CHOL 136 09/01/2017 03:23 AM   TRIG 57 09/01/2017 03:23 AM   HDL 53 09/01/2017  03:23 AM   CHOLHDL 2.6 09/01/2017 03:23 AM   LDLCALC 72 09/01/2017 03:23 AM    Wt Readings from Last 3 Encounters:  08/30/19 134 lb (60.8 kg)  06/18/18 140 lb 14 oz (63.9 kg)  06/15/18 140 lb 14 oz (63.9 kg)     Objective:    Vital Signs:  BP (!) 146/98    Pulse 89    Ht 5\' 11"  (1.803 m)    Wt 134 lb (60.8 kg)    BMI 18.69 kg/m    Unable to accurately assess as his daughter was helping with the call and he was unable to hear me over the phone.   ASSESSMENT & PLAN:    1.  Coronary artery disease: According to his daughter who is very attentive he has not had any complaints of chest pain, shortness of breath, excessive bruising or bleeding on Plavix.  She make sure that he is medically compliant.  He is not extremely active but does walk around, has friends, and completes his own ADLs.  At this time we will continue current medical management.  He will need to have some follow-up labs which are supposed to be completed by his PCP on follow-up appointments.  If he does not have follow-up labs by the next appointment with [place] after the first year these will be ordered.  2.  Hypertension: His daughter just bought a brand-new blood pressure machine.  Blood pressures today are reflective of his blood pressure prior to taking his medications.  She states he is tolerating it without dizziness, headache, or hypotension.  She will continue to watch his blood pressure and report any extreme highs or lows to our office.  3.  Chronic diastolic heart failure: The patient remains on Lasix which he takes daily with potassium.  She states that he is still getting good results without any lower extremity edema or dyspnea.  4.  Non-insulin-dependent diabetes: Continues on metformin.  This is managed by his primary care physician Dr. Laurann Montana.  Follow-up labs per PCP.  COVID-19 Education: The signs and symptoms of COVID-19 were discussed with the patient and how to seek care for testing (follow up with PCP or  arrange E-visit).  The importance of social distancing was discussed today.  Time:   Today, I have spent 15 minutes with the patient with telehealth technology  discussing the above problems.     Medication Adjustments/Labs and Tests Ordered: Current medicines are reviewed at length with the patient today.  Concerns regarding medicines are outlined above.   Tests Ordered: No orders of the defined types were placed in this encounter.   Medication Changes: Meds ordered this encounter  Medications   lisinopril (ZESTRIL) 20 MG tablet    Sig: TAKE 1 TABLET(20 MG) BY MOUTH DAILY    Dispense:  90 tablet    Refill:  3   potassium chloride (KLOR-CON) 10 MEQ tablet    Sig: Take 2 tablets (20 mEq total) by mouth daily.    Dispense:  90 tablet    Refill:  3   furosemide (LASIX) 40 MG tablet    Sig: Take 1 tablet (40 mg total) by mouth daily.    Dispense:  90 tablet    Refill:  3   clopidogrel (PLAVIX) 75 MG tablet    Sig: Take 1 tablet (75 mg total) by mouth daily.    Dispense:  90 tablet    Refill:  3    Disposition:  Follow up 4 to 6 months  Signed, Phill Myron. West Pugh, ANP, AACC  08/30/2019 10:18 AM    [facility]

## 2019-12-21 DIAGNOSIS — Z8639 Personal history of other endocrine, nutritional and metabolic disease: Secondary | ICD-10-CM | POA: Diagnosis not present

## 2019-12-21 DIAGNOSIS — R634 Abnormal weight loss: Secondary | ICD-10-CM | POA: Diagnosis not present

## 2019-12-21 DIAGNOSIS — Z1389 Encounter for screening for other disorder: Secondary | ICD-10-CM | POA: Diagnosis not present

## 2019-12-21 DIAGNOSIS — E7800 Pure hypercholesterolemia, unspecified: Secondary | ICD-10-CM | POA: Diagnosis not present

## 2019-12-21 DIAGNOSIS — E1151 Type 2 diabetes mellitus with diabetic peripheral angiopathy without gangrene: Secondary | ICD-10-CM | POA: Diagnosis not present

## 2019-12-21 DIAGNOSIS — I2510 Atherosclerotic heart disease of native coronary artery without angina pectoris: Secondary | ICD-10-CM | POA: Diagnosis not present

## 2019-12-21 DIAGNOSIS — I10 Essential (primary) hypertension: Secondary | ICD-10-CM | POA: Diagnosis not present

## 2019-12-21 DIAGNOSIS — R413 Other amnesia: Secondary | ICD-10-CM | POA: Diagnosis not present

## 2019-12-21 DIAGNOSIS — I739 Peripheral vascular disease, unspecified: Secondary | ICD-10-CM | POA: Diagnosis not present

## 2019-12-21 DIAGNOSIS — Z0000 Encounter for general adult medical examination without abnormal findings: Secondary | ICD-10-CM | POA: Diagnosis not present

## 2019-12-21 DIAGNOSIS — N400 Enlarged prostate without lower urinary tract symptoms: Secondary | ICD-10-CM | POA: Diagnosis not present

## 2020-01-21 ENCOUNTER — Ambulatory Visit: Payer: PPO | Attending: Internal Medicine

## 2020-01-21 DIAGNOSIS — Z23 Encounter for immunization: Secondary | ICD-10-CM

## 2020-01-21 NOTE — Progress Notes (Signed)
   Covid-19 Vaccination Clinic  Name:  Robert [name]    MRN: [id number] DOB: [birth year]  01/21/2020  [name] was observed post Covid-19 immunization for 15 minutes without incident. He was provided with Vaccine Information Sheet and instruction to access the V-Safe system.   [name] was instructed to call 911 with any severe reactions post vaccine: Marland Kitchen Difficulty breathing  . Swelling of face and throat  . A fast heartbeat  . A bad rash all over body  . Dizziness and weakness   Immunizations Administered    Name Date Dose VIS Date Route   Pfizer COVID-19 Vaccine 01/21/2020  9:10 AM 0.3 mL 10/05/2019 Intramuscular   Manufacturer: [employer]   Lot: [id number]   [place]: [id number]

## 2020-02-07 ENCOUNTER — Ambulatory Visit (INDEPENDENT_AMBULATORY_CARE_PROVIDER_SITE_OTHER): Payer: PPO | Admitting: Otolaryngology

## 2020-02-07 ENCOUNTER — Other Ambulatory Visit: Payer: Self-pay

## 2020-02-07 VITALS — Temp 97.9°F

## 2020-02-07 DIAGNOSIS — H6123 Impacted cerumen, bilateral: Secondary | ICD-10-CM | POA: Diagnosis not present

## 2020-02-07 NOTE — Progress Notes (Signed)
HPI: Robert [name] is a 86 y.o. male who presents for evaluation of cerumen buildup referred by [facility] prior to obtaining audiogram.  No ear pain or drainage..  Past Medical History:  Diagnosis Date  . Atrial fibrillation ([place])   . CAD (coronary artery disease)    DES to Cfx  . Diabetes mellitus without complication ([place])   . Fall at home 06/15/2018  . History of nuclear stress test 02/2012   lexiscan; mild-mod perfusion defect in basal inferoseptal, mid inferoseptal apical anterior; no change from previous study, abnormal test   . Hyperlipidemia   . Hypertension   . Myocardial infarction ([place]) 1980s  . PAD (peripheral artery disease) ([place])    aortic stent graft 2008  . PUD (peptic ulcer disease)   . Small bowel obstruction ([place]) 10/2011  . Ulcer    Past Surgical History:  Procedure Laterality Date  . CARDIAC CATHETERIZATION  1987   occluded marginal branch of RCA, treated medically - Dr Melvern Banker  . CORONARY ANGIOPLASTY WITH STENT PLACEMENT  03/2003   2.5x75mm Taxus DES to Cfx  . ESOPHAGOGASTRODUODENOSCOPY N/A 03/14/2014   Procedure: ESOPHAGOGASTRODUODENOSCOPY (EGD) with Balloon Dilation;  Surgeon: Garlan Fair, MD;  Location: [facility];  Service: Endoscopy;  Laterality: N/A;  . EYE SURGERY Right    cataract surgery  . GASTRECTOMY  50 yrs ago - 2 operations   had surgery at the age of 81 for the ulcer  . ILIAC ARTERY ANEURYSM REPAIR     Repaied by Dr. Kellie Simmering april 2009  . JOINT REPLACEMENT Left    hip replacement  . TRANSTHORACIC ECHOCARDIOGRAM  11/2009   EF=>55%, mild conc LVH, normal LV systolic function; mild MR; mild-mod TR with RV systolic pressure elevated at 30-30mmHg; mild-mod AV regurg; mild pulm valve regurg   Social History   Socioeconomic History  . Marital status: Widowed    Spouse name: Not on file  . Number of children: Not on file  . Years of education: Not on file  . Highest education level: Not on file  Occupational History  . Not on file   Tobacco Use  . Smoking status: Former Smoker    Years: 0.50    Types: Cigarettes    Quit date: 07/18/1992    Years since quitting: 27.5  . Smokeless tobacco: Never Used  . Tobacco comment: quit around HA time  Substance and Sexual Activity  . Alcohol use: No  . Drug use: No  . Sexual activity: Not on file  Other Topics Concern  . 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   Social Determinants of Health   Financial Resource Strain:   . Difficulty of Paying Living Expenses:   Food Insecurity:   . Worried About Charity fundraiser in the Last Year:   . Arboriculturist in the Last Year:   Transportation Needs:   . Film/video editor (Medical):   Marland Kitchen Lack of Transportation (Non-Medical):   Physical Activity:   . Days of Exercise per Week:   . Minutes of Exercise per Session:   Stress:   . Feeling of Stress :   Social Connections:   . Frequency of Communication with Friends and Family:   . Frequency of Social Gatherings with Friends and Family:   . Attends Religious Services:   . Active Member of Clubs or Organizations:   . Attends Archivist Meetings:   .  Marital Status:    Family History  Problem Relation Age of Onset  . Stroke Father   . Heart disease Father   . Hypertension Father   . Heart disease Daughter        Before age 52  . Diabetes Sister        DVT-Blood clots in vein   Allergies  Allergen Reactions  . Diona Fanti W/Codeine [Aspirin-Codeine] Other (See Comments)    Irritates his water system (urination burning)   Prior to Admission medications   Medication Sig Start Date End Date Taking? Authorizing Provider  brimonidine (ALPHAGAN P) 0.1 % SOLN Place 1 drop into both eyes daily.    Yes [provider]  clopidogrel (PLAVIX) 75 MG tablet Take 1 tablet (75 mg total) by mouth daily. 08/30/19  Yes Lendon Colonel, NP  furosemide (LASIX) 40 MG tablet Take 1 tablet (40 mg total)  by mouth daily. 08/30/19  Yes Lendon Colonel, NP  latanoprost (XALATAN) 0.005 % ophthalmic solution Place 1 drop into the left eye at bedtime.    Yes [provider]  lisinopril (ZESTRIL) 20 MG tablet TAKE 1 TABLET(20 MG) BY MOUTH DAILY 08/30/19  Yes Lendon Colonel, NP  metFORMIN (GLUCOPHAGE) 500 MG tablet Take 500 mg by mouth 2 (two) times daily. 09/04/15  Yes [provider]  potassium chloride (KLOR-CON) 10 MEQ tablet Take 2 tablets (20 mEq total) by mouth daily. 08/30/19  Yes Lendon Colonel, NP  tamsulosin (FLOMAX) 0.4 MG CAPS capsule Take 1 capsule by mouth daily. 04/04/19  Yes [provider]     Positive ROS: Otherwise negative  All other systems have been reviewed and were otherwise negative with the exception of those mentioned in the HPI and as above.  Physical Exam: Constitutional: Alert, well-appearing, no acute distress Ears: External ears without lesions or tenderness. Ear canals large amount of cerumen obstructing both ear canals that was removed with forceps and curettes.  TMs were clear bilaterally.. Nasal: External nose without lesions. Clear nasal passages Oral: Oropharynx clear. Neck: No palpable adenopathy or masses Respiratory: Breathing comfortably  Skin: No facial/neck lesions or rash noted.  Cerumen impaction removal  Date/Time: 02/07/2020 8:13 PM Performed by: Rozetta Nunnery, MD Authorized by: Rozetta Nunnery, MD   Consent:    Consent obtained:  Verbal   Consent given by:  Patient   Risks discussed:  Pain and bleeding Procedure details:    Location:  L ear and R ear   Procedure type: curette and forceps   Post-procedure details:    Inspection:  TM intact and canal normal   Hearing quality:  Improved   Patient tolerance of procedure:  Tolerated well, no immediate complications Comments:     TMs are clear bilaterally    Assessment: Bilateral cerumen impactions  Plan: This was cleaned in the  office. He will follow-up with [facility] concerning obtaining audiogram.  Radene Journey, MD

## 2020-02-12 ENCOUNTER — Ambulatory Visit: Payer: PPO | Attending: Internal Medicine

## 2020-02-12 DIAGNOSIS — Z23 Encounter for immunization: Secondary | ICD-10-CM

## 2020-02-12 NOTE — Progress Notes (Signed)
   Covid-19 Vaccination Clinic  Name:  Kumail Tremain    MRN: [id number] DOB: Dec 03, 1931  02/12/2020  Mr. Rossen was observed post Covid-19 immunization for 15 minutes without incident. He was provided with Vaccine Information Sheet and instruction to access the V-Safe system.   Mr. Cagnina was instructed to call 911 with any severe reactions post vaccine: Marland Kitchen Difficulty breathing  . Swelling of face and throat  . A fast heartbeat  . A bad rash all over body  . Dizziness and weakness   Immunizations Administered    Name Date Dose VIS Date Route   Pfizer COVID-19 Vaccine 02/12/2020  1:39 PM 0.3 mL 12/19/2018 Intramuscular   Manufacturer: [place]   Lot: [id number]   [place]: [id number]

## 2020-04-02 ENCOUNTER — Encounter: Payer: Self-pay | Admitting: General Practice

## 2020-04-24 DIAGNOSIS — Z8639 Personal history of other endocrine, nutritional and metabolic disease: Secondary | ICD-10-CM | POA: Diagnosis not present

## 2020-04-24 DIAGNOSIS — E1151 Type 2 diabetes mellitus with diabetic peripheral angiopathy without gangrene: Secondary | ICD-10-CM | POA: Diagnosis not present

## 2020-04-24 DIAGNOSIS — I739 Peripheral vascular disease, unspecified: Secondary | ICD-10-CM | POA: Diagnosis not present

## 2020-04-24 DIAGNOSIS — I10 Essential (primary) hypertension: Secondary | ICD-10-CM | POA: Diagnosis not present

## 2020-04-24 DIAGNOSIS — R636 Underweight: Secondary | ICD-10-CM | POA: Diagnosis not present

## 2020-04-24 DIAGNOSIS — H9192 Unspecified hearing loss, left ear: Secondary | ICD-10-CM | POA: Diagnosis not present

## 2020-09-22 ENCOUNTER — Other Ambulatory Visit: Payer: Self-pay | Admitting: Adult Health

## 2020-11-02 ENCOUNTER — Other Ambulatory Visit: Payer: Self-pay | Admitting: Adult Health

## 2020-12-25 DIAGNOSIS — Z862 Personal history of diseases of the blood and blood-forming organs and certain disorders involving the immune mechanism: Secondary | ICD-10-CM | POA: Diagnosis not present

## 2020-12-25 DIAGNOSIS — E1151 Type 2 diabetes mellitus with diabetic peripheral angiopathy without gangrene: Secondary | ICD-10-CM | POA: Diagnosis not present

## 2020-12-25 DIAGNOSIS — H409 Unspecified glaucoma: Secondary | ICD-10-CM | POA: Diagnosis not present

## 2020-12-25 DIAGNOSIS — M109 Gout, unspecified: Secondary | ICD-10-CM | POA: Diagnosis not present

## 2020-12-25 DIAGNOSIS — I10 Essential (primary) hypertension: Secondary | ICD-10-CM | POA: Diagnosis not present

## 2020-12-25 DIAGNOSIS — I2510 Atherosclerotic heart disease of native coronary artery without angina pectoris: Secondary | ICD-10-CM | POA: Diagnosis not present

## 2020-12-25 DIAGNOSIS — D599 Acquired hemolytic anemia, unspecified: Secondary | ICD-10-CM | POA: Diagnosis not present

## 2020-12-25 DIAGNOSIS — Z23 Encounter for immunization: Secondary | ICD-10-CM | POA: Diagnosis not present

## 2020-12-25 DIAGNOSIS — N400 Enlarged prostate without lower urinary tract symptoms: Secondary | ICD-10-CM | POA: Diagnosis not present

## 2020-12-25 DIAGNOSIS — I499 Cardiac arrhythmia, unspecified: Secondary | ICD-10-CM | POA: Diagnosis not present

## 2020-12-25 DIAGNOSIS — E538 Deficiency of other specified B group vitamins: Secondary | ICD-10-CM | POA: Diagnosis not present

## 2020-12-25 DIAGNOSIS — E7800 Pure hypercholesterolemia, unspecified: Secondary | ICD-10-CM | POA: Diagnosis not present

## 2020-12-25 DIAGNOSIS — Z0000 Encounter for general adult medical examination without abnormal findings: Secondary | ICD-10-CM | POA: Diagnosis not present

## 2020-12-25 DIAGNOSIS — Z8639 Personal history of other endocrine, nutritional and metabolic disease: Secondary | ICD-10-CM | POA: Diagnosis not present

## 2020-12-25 DIAGNOSIS — Z1389 Encounter for screening for other disorder: Secondary | ICD-10-CM | POA: Diagnosis not present

## 2020-12-25 DIAGNOSIS — I739 Peripheral vascular disease, unspecified: Secondary | ICD-10-CM | POA: Diagnosis not present

## 2021-01-08 ENCOUNTER — Ambulatory Visit: Payer: PPO | Admitting: Podiatry

## 2021-01-08 ENCOUNTER — Other Ambulatory Visit: Payer: Self-pay

## 2021-01-08 DIAGNOSIS — M79675 Pain in left toe(s): Secondary | ICD-10-CM | POA: Diagnosis not present

## 2021-01-08 DIAGNOSIS — E119 Type 2 diabetes mellitus without complications: Secondary | ICD-10-CM | POA: Diagnosis not present

## 2021-01-08 DIAGNOSIS — B351 Tinea unguium: Secondary | ICD-10-CM

## 2021-01-08 DIAGNOSIS — M79674 Pain in right toe(s): Secondary | ICD-10-CM

## 2021-01-12 NOTE — Progress Notes (Signed)
  Subjective:  Patient ID: Robert [name], male    DOB: [birth year],  MRN: [id number]  Chief Complaint  Patient presents with  . Nail Problem    Diabetic foot and nail care. Nails are curving into the skin    87 y.o. male presents with the above complaint. History confirmed with patient.  He has type 2 diabetes, he says his A1c is "good"  Objective:  Physical Exam: warm, good capillary refill, no trophic changes or ulcerative lesions, normal DP and PT pulses and normal sensory exam.  Thickened elongated incurvated nails x10 with brown discoloration and subungual debris Assessment:   1. Type 2 diabetes mellitus without complication, without long-term current use of insulin ([place])   2. Onychomycosis   3. Pain due to onychomycosis of toenails of both feet      Plan:  Patient was evaluated and treated and all questions answered.   Patient educated on diabetes. Discussed proper diabetic foot care and discussed risks and complications of disease. Educated patient in depth on reasons to return to the office immediately should he/she discover anything concerning or new on the feet. All questions answered. Discussed proper shoes as well.   Discussed the etiology and treatment options for the condition in detail with the patient. Educated patient on the topical and oral treatment options for mycotic nails. Recommended debridement of the nails today. Sharp and mechanical debridement performed of all painful and mycotic nails today. Nails debrided in length and thickness using a nail nipper to level of comfort. Discussed treatment options including appropriate shoe gear. Follow up as needed for painful nails.    Return in about 3 months (around 04/10/2021) for at risk diabetic foot care.

## 2021-02-27 DIAGNOSIS — D508 Other iron deficiency anemias: Secondary | ICD-10-CM | POA: Diagnosis not present

## 2021-02-27 DIAGNOSIS — D599 Acquired hemolytic anemia, unspecified: Secondary | ICD-10-CM | POA: Diagnosis not present

## 2021-02-27 DIAGNOSIS — J4520 Mild intermittent asthma, uncomplicated: Secondary | ICD-10-CM | POA: Diagnosis not present

## 2021-02-27 DIAGNOSIS — N400 Enlarged prostate without lower urinary tract symptoms: Secondary | ICD-10-CM | POA: Diagnosis not present

## 2021-02-27 DIAGNOSIS — I10 Essential (primary) hypertension: Secondary | ICD-10-CM | POA: Diagnosis not present

## 2021-02-27 DIAGNOSIS — H409 Unspecified glaucoma: Secondary | ICD-10-CM | POA: Diagnosis not present

## 2021-02-27 DIAGNOSIS — E1151 Type 2 diabetes mellitus with diabetic peripheral angiopathy without gangrene: Secondary | ICD-10-CM | POA: Diagnosis not present

## 2021-02-27 DIAGNOSIS — I2510 Atherosclerotic heart disease of native coronary artery without angina pectoris: Secondary | ICD-10-CM | POA: Diagnosis not present

## 2021-02-27 DIAGNOSIS — E7800 Pure hypercholesterolemia, unspecified: Secondary | ICD-10-CM | POA: Diagnosis not present

## 2021-03-09 DIAGNOSIS — H35033 Hypertensive retinopathy, bilateral: Secondary | ICD-10-CM | POA: Diagnosis not present

## 2021-03-09 DIAGNOSIS — H35372 Puckering of macula, left eye: Secondary | ICD-10-CM | POA: Diagnosis not present

## 2021-03-09 DIAGNOSIS — H43813 Vitreous degeneration, bilateral: Secondary | ICD-10-CM | POA: Diagnosis not present

## 2021-03-09 DIAGNOSIS — H40023 Open angle with borderline findings, high risk, bilateral: Secondary | ICD-10-CM | POA: Diagnosis not present

## 2021-03-09 DIAGNOSIS — Z961 Presence of intraocular lens: Secondary | ICD-10-CM | POA: Diagnosis not present

## 2021-04-01 ENCOUNTER — Other Ambulatory Visit: Payer: Self-pay

## 2021-04-01 ENCOUNTER — Emergency Department (HOSPITAL_COMMUNITY)
Admission: EM | Admit: 2021-04-01 | Discharge: 2021-04-01 | Disposition: A | Discharge status: Home / Self Care | Payer: PPO | Attending: Emergency Medicine | Admitting: Emergency Medicine

## 2021-04-01 ENCOUNTER — Emergency Department (HOSPITAL_COMMUNITY): Payer: PPO

## 2021-04-01 ENCOUNTER — Encounter (HOSPITAL_COMMUNITY): Payer: Self-pay

## 2021-04-01 DIAGNOSIS — Z96642 Presence of left artificial hip joint: Secondary | ICD-10-CM | POA: Insufficient documentation

## 2021-04-01 DIAGNOSIS — I2510 Atherosclerotic heart disease of native coronary artery without angina pectoris: Secondary | ICD-10-CM | POA: Insufficient documentation

## 2021-04-01 DIAGNOSIS — R1030 Lower abdominal pain, unspecified: Secondary | ICD-10-CM | POA: Diagnosis not present

## 2021-04-01 DIAGNOSIS — Z7984 Long term (current) use of oral hypoglycemic drugs: Secondary | ICD-10-CM | POA: Insufficient documentation

## 2021-04-01 DIAGNOSIS — I10 Essential (primary) hypertension: Secondary | ICD-10-CM | POA: Diagnosis not present

## 2021-04-01 DIAGNOSIS — Z79899 Other long term (current) drug therapy: Secondary | ICD-10-CM | POA: Insufficient documentation

## 2021-04-01 DIAGNOSIS — I119 Hypertensive heart disease without heart failure: Secondary | ICD-10-CM | POA: Diagnosis not present

## 2021-04-01 DIAGNOSIS — R109 Unspecified abdominal pain: Secondary | ICD-10-CM | POA: Diagnosis not present

## 2021-04-01 DIAGNOSIS — I499 Cardiac arrhythmia, unspecified: Secondary | ICD-10-CM | POA: Diagnosis not present

## 2021-04-01 DIAGNOSIS — R079 Chest pain, unspecified: Secondary | ICD-10-CM | POA: Diagnosis not present

## 2021-04-01 DIAGNOSIS — E119 Type 2 diabetes mellitus without complications: Secondary | ICD-10-CM | POA: Insufficient documentation

## 2021-04-01 DIAGNOSIS — I4891 Unspecified atrial fibrillation: Secondary | ICD-10-CM | POA: Diagnosis not present

## 2021-04-01 DIAGNOSIS — I491 Atrial premature depolarization: Secondary | ICD-10-CM | POA: Diagnosis not present

## 2021-04-01 DIAGNOSIS — Z87891 Personal history of nicotine dependence: Secondary | ICD-10-CM | POA: Insufficient documentation

## 2021-04-01 DIAGNOSIS — R1084 Generalized abdominal pain: Secondary | ICD-10-CM | POA: Diagnosis not present

## 2021-04-01 DIAGNOSIS — I517 Cardiomegaly: Secondary | ICD-10-CM | POA: Diagnosis not present

## 2021-04-01 DIAGNOSIS — R0789 Other chest pain: Secondary | ICD-10-CM | POA: Diagnosis not present

## 2021-04-01 LAB — COMPREHENSIVE METABOLIC PANEL
ALT: 19 U/L (ref 0–44)
AST: 23 U/L (ref 15–41)
Albumin: 4 g/dL (ref 3.5–5.0)
Alkaline Phosphatase: 69 U/L (ref 38–126)
Anion gap: 13 (ref 5–15)
BUN: 11 mg/dL (ref 8–23)
CO2: 21 mmol/L — ABNORMAL LOW (ref 22–32)
Calcium: 9.1 mg/dL (ref 8.9–10.3)
Chloride: 106 mmol/L (ref 98–111)
Creatinine, Ser: 0.93 mg/dL (ref 0.61–1.24)
GFR, Estimated: >60 mL/min (ref >60)
Glucose, Bld: 135 mg/dL — ABNORMAL HIGH (ref 70–99)
Potassium: 3.7 mmol/L (ref 3.5–5.1)
Sodium: 140 mmol/L (ref 135–145)
Total Bilirubin: 1.1 mg/dL (ref 0.3–1.2)
Total Protein: 8.2 g/dL — ABNORMAL HIGH (ref 6.5–8.1)

## 2021-04-01 LAB — CBC WITH DIFFERENTIAL/PLATELET
Abs Immature Granulocytes: 0.01 10*3/uL (ref 0.00–0.07)
Basophils Absolute: 0 10*3/uL (ref 0.0–0.1)
Basophils Relative: 1 %
Eosinophils Absolute: 0 10*3/uL (ref 0.0–0.5)
Eosinophils Relative: 1 %
HCT: 45 % (ref 39.0–52.0)
Hemoglobin: 13.6 g/dL (ref 13.0–17.0)
Immature Granulocytes: 0 %
Lymphocytes Relative: 20 %
Lymphs Abs: 0.6 10*3/uL — ABNORMAL LOW (ref 0.7–4.0)
MCH: 27.5 pg (ref 26.0–34.0)
MCHC: 30.2 g/dL (ref 30.0–36.0)
MCV: 91.1 fL (ref 80.0–100.0)
Monocytes Absolute: 0.2 10*3/uL (ref 0.1–1.0)
Monocytes Relative: 7 %
Neutro Abs: 2.3 10*3/uL (ref 1.7–7.7)
Neutrophils Relative %: 71 %
Platelets: 202 10*3/uL (ref 150–400)
RBC: 4.94 MIL/uL (ref 4.22–5.81)
RDW: 17.3 % — ABNORMAL HIGH (ref 11.5–15.5)
WBC: 3.2 10*3/uL — ABNORMAL LOW (ref 4.0–10.5)
nRBC: 0 % (ref 0.0–0.2)

## 2021-04-01 LAB — TROPONIN I (HIGH SENSITIVITY): Troponin I (High Sensitivity): 3 ng/L (ref <18)

## 2021-04-01 MED ORDER — IOHEXOL 300 MG/ML  SOLN
75.0000 mL | Freq: Once | INTRAMUSCULAR | Status: AC | PRN
  Administered 2021-04-01: 75 mL via INTRAVENOUS

## 2021-04-01 NOTE — ED Provider Notes (Signed)
Emergency Medicine Provider Triage Evaluation Note  Robert [name] , a 87 y.o. male  was evaluated in triage.  Pt presents via ems for chest pain. Pt has hx dementia so there is a level 5 caveat. Per ems, he told family that he had chest pain. He was in afib/flutter en route.  Review of Systems  Positive: Chest pain Negative: n/a  Physical Exam  There were no vitals taken for this visit. Gen:   Awake, no distress   Resp:  Normal effort  Other:  Tachycardic, clear lung sounds  Medical Decision Making  Medically screening exam initiated at 6:36 PM.  Appropriate orders placed.  Alejandra Barna was informed that the remainder of the evaluation will be completed by another provider, this initial triage assessment does not replace that evaluation, and the importance of remaining in the ED until their evaluation is complete.     Bishop Dublin 04/01/21 1840    Sherwood Gambler, MD 04/01/21 [phone]

## 2021-04-01 NOTE — ED Notes (Signed)
Pt refused having blood drawn for troponin MD Lisabeth Devoid notified

## 2021-04-01 NOTE — ED Notes (Signed)
RN asked pt what brings him in today; to evaluate pt., pt stated he does not know why he is here

## 2021-04-01 NOTE — Discharge Instructions (Addendum)
Robert [name] was seen for lower abdominal pain. His labs were not concerning and the abdominal scan did not show any significant changes from prior images that would explain his pain. If he is constipated please try increasing fiber and fluid intake. You can also try a fiber supplement or miralax to help with symptoms. If he develops urinary symptoms I would recommend trying to collect a urine sample and bring it to his primary care physician. Please return if the abdomina pain is worsening. He develops fever, uncontrollable nausea or vomiting, inability to eat or drink, or he is unable to pass BM or gas.

## 2021-04-01 NOTE — ED Notes (Signed)
Pt refused discharge vitals. Pt stated he is ready to go.Patient verbalized understanding of discharge instructions. Opportunity for questions and answers.

## 2021-04-01 NOTE — ED Triage Notes (Signed)
Patient arrived from home with complaint of chest pain. EMS reports that patient has dementia and found patient in atrial fib/flutter. Patient is nonverbal when asked questions regarding pain, no grimacing

## 2021-04-01 NOTE — ED Provider Notes (Signed)
[facility] EMERGENCY DEPARTMENT Provider Note   CSN: [id number] Arrival date & time: 04/01/21  1827     History Chief compliant: Abdominal pain  Robert [name] is a 87 y.o. male with history listed below presents for lower abdominal pain that started this afternoon. History is limited due to patient's dementia. Daughter in room reports patient was at home with her son this afternoon and noticed patient complaining of abdominal pain. Patient states pain occurs with inhalation and is in the lower belly. He denies fever, chest pain, shortness of breath, pr dysuria. Daughter reports patient has been in usual state of health until today. Notes patient live with her usually performs all of his ADLs. She administers his medication due to his memory issues, at baseline patient is very hard of hearing and intermittently becomes disoriented. Daughter states he has a history of irregular heart rhythm and is not on blood thinner per his PCP.     Past Medical History:  Diagnosis Date  . Atrial fibrillation ([place])   . CAD (coronary artery disease)    DES to Cfx  . Diabetes mellitus without complication ([place])   . Fall at home 06/15/2018  . History of nuclear stress test 02/2012   lexiscan; mild-mod perfusion defect in basal inferoseptal, mid inferoseptal apical anterior; no change from previous study, abnormal test   . Hyperlipidemia   . Hypertension   . Myocardial infarction ([place]) 1980s  . PAD (peripheral artery disease) ([place])    aortic stent graft 2008  . PUD (peptic ulcer disease)   . Small bowel obstruction ([place]) 10/2011  . Ulcer     Patient Active Problem List   Diagnosis Date Noted  . Fall 06/15/2018  . BPH (benign prostatic hyperplasia) 06/15/2018  . Microcytic hypochromic anemia 06/15/2018  . Hypokalemia 06/15/2018  . Abnormal EKG 06/15/2018  . Chest pain 08/31/2017  . Other fatigue 06/23/2016  . Frequent PVCs 06/23/2016  . Symptomatic bradycardia 05/20/2015  .  Pain of left lower extremity-Left Hip/Leg 06/04/2014  . Numbness-Bilat Leg 06/04/2014  . Weakness of both legs 06/04/2014  . Aftercare following surgery of the circulatory system, [place] 10/02/2013  . AAA (abdominal aortic aneurysm) ([place]) 07/18/2012  . 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 Renal mass, left 11/07/2011  . SBO (small bowel obstruction) ([place]) 11/07/2011  . Hyperkalemia 11/07/2011  . CAD (coronary artery disease)   . Inferior MI ([place])   . Hyperlipidemia   . Essential hypertension   . Ulcer-s/p Gastrectomy 40 yr ago     Past Surgical History:  Procedure Laterality Date  . CARDIAC CATHETERIZATION  1987   occluded marginal branch of RCA, treated medically - Dr Melvern Banker  . CORONARY ANGIOPLASTY WITH STENT PLACEMENT  03/2003   2.5x26mm Taxus DES to Cfx  . ESOPHAGOGASTRODUODENOSCOPY N/A 03/14/2014   Procedure: ESOPHAGOGASTRODUODENOSCOPY (EGD) with Balloon Dilation;  Surgeon: Garlan Fair, MD;  Location: [facility] ENDOSCOPY;  Service: Endoscopy;  Laterality: N/A;  . EYE SURGERY Right    cataract surgery  . GASTRECTOMY  50 yrs ago - 2 operations   had surgery at the age of 58 for the ulcer  . ILIAC ARTERY ANEURYSM REPAIR     Repaied by Dr. Kellie Simmering april 2009  . JOINT REPLACEMENT Left    hip replacement  . TRANSTHORACIC ECHOCARDIOGRAM  11/2009   EF=>55%, mild conc LVH, normal LV systolic function; mild MR; mild-mod TR with RV systolic pressure elevated at 30-47mmHg; mild-mod AV regurg; mild pulm valve regurg  Family History  Problem Relation Age of Onset  . Stroke Father   . Heart disease Father   . Hypertension Father   . Heart disease Daughter        Before age 13  . Diabetes Sister        DVT-Blood clots in vein    Social History   Tobacco Use  . Smoking status: Former Smoker    Years: 0.50    Types: Cigarettes    Quit date: 07/18/1992    Years since quitting: 28.7  . Smokeless tobacco: Never Used  . Tobacco comment: quit around [facility] time  Vaping Use  .  Vaping Use: Never used  Substance Use Topics  . Alcohol use: No  . Drug use: No    Home Medications Prior to Admission medications   Medication Sig Start Date End Date Taking? Authorizing Provider  atorvastatin (LIPITOR) 10 MG tablet Take 1 tablet by mouth daily.    [provider]  brimonidine (ALPHAGAN P) 0.1 % SOLN Place 1 drop into both eyes daily.     [provider]  brimonidine-timolol (COMBIGAN) 0.2-0.5 % ophthalmic solution INSTILL 1 DROP INTO EACH EYE 2 TIMES DAILY    [provider]  clopidogrel (PLAVIX) 75 MG tablet TAKE 1 TABLET(75 MG) BY MOUTH DAILY 09/23/20   Hilty, Nadean Corwin, MD  Colchicine 0.6 MG CAPS 1 capsule 07/04/20   [provider]  Ferrous Sulfate (IRON) 325 (65 Fe) MG TABS Take 1 tablet by mouth daily. 12/31/20   [provider]  furosemide (LASIX) 40 MG tablet TAKE 1 TABLET(40 MG) BY MOUTH DAILY 11/03/20   Hilty, Nadean Corwin, MD  indomethacin (INDOCIN) 50 MG capsule 1 capsule with food or milk 07/04/20   [provider]  Iron, Ferrous Sulfate, 325 (65 Fe) MG TABS 1 tablet 12/31/20   [provider]  latanoprost (XALATAN) 0.005 % ophthalmic solution Place 1 drop into the left eye at bedtime.     [provider]  lisinopril (ZESTRIL) 20 MG tablet TAKE 1 TABLET(20 MG) BY MOUTH DAILY 09/23/20   Hilty, Nadean Corwin, MD  metFORMIN (GLUCOPHAGE) 500 MG tablet Take 500 mg by mouth 2 (two) times daily. 09/04/15   [provider]  metoprolol tartrate (LOPRESSOR) 25 MG tablet Take 25 mg by mouth 2 (two) times daily. 12/25/20   [provider]  potassium chloride (KLOR-CON) 10 MEQ tablet Take 2 tablets (20 mEq total) by mouth daily. 08/30/19   Lendon Colonel, NP  potassium chloride (KLOR-CON) 10 MEQ tablet Take 1 tablet by mouth daily.    [provider]  tamsulosin (FLOMAX) 0.4 MG CAPS capsule Take 1 capsule by mouth daily. 04/04/19   [provider]  vitamin B-12 (CYANOCOBALAMIN)  1000 MCG tablet 1 tablet 03/26/16   [provider]    Allergies    Asa w/codeine [aspirin-codeine]  Review of Systems   Review of Systems  Gastrointestinal: Positive for abdominal pain (lower).  All other systems reviewed and are negative.   Physical Exam Updated Vital Signs BP (!) 145/83   Pulse 95   Temp 98.3 F (36.8 C) (Oral)   Resp (!) 21   SpO2 98%   Physical Exam Constitutional:      Appearance: Normal appearance.     Comments: cachectic   HENT:     Head: Normocephalic and atraumatic.     Right Ear: External ear normal.     Left Ear: External ear normal.  Nose: Nose normal.     Mouth/Throat:     Mouth: Mucous membranes are moist.     Pharynx: Oropharynx is clear.  Eyes:     Extraocular Movements: Extraocular movements intact.     Conjunctiva/sclera: Conjunctivae normal.     Pupils: Pupils are equal, round, and reactive to light.  Cardiovascular:     Rate and Rhythm: Normal rate. Rhythm irregular.     Pulses: Normal pulses.     Heart sounds: No murmur heard. No friction rub. No gallop.   Pulmonary:     Effort: Pulmonary effort is normal. No respiratory distress.     Breath sounds: Normal breath sounds. No wheezing.  Abdominal:     General: Abdomen is flat. Bowel sounds are normal. There is no distension.     Palpations: Abdomen is soft.     Tenderness: There is no abdominal tenderness. There is no guarding or rebound.     Comments: Scaphoid appearance, no bruit  Musculoskeletal:        General: Normal range of motion.     Cervical back: Normal range of motion and neck supple.     Right lower leg: No edema.     Left lower leg: No edema.  Skin:    General: Skin is warm and dry.     Capillary Refill: Capillary refill takes less than 2 seconds.  Neurological:     General: No focal deficit present.     Mental Status: He is alert. Mental status is at baseline.     ED Results / Procedures / Treatments   Labs (all labs ordered are listed, but  only abnormal results are displayed) Labs Reviewed  CBC WITH DIFFERENTIAL/PLATELET - Abnormal; Notable for the following components:      Result Value   WBC 3.2 (*)    RDW 17.3 (*)    Lymphs Abs 0.6 (*)    All other components within normal limits  COMPREHENSIVE METABOLIC PANEL  TROPONIN I (HIGH SENSITIVITY)  TROPONIN I (HIGH SENSITIVITY)    EKG EKG Interpretation  Date/Time:  Wednesday April 01 2021 18:34:42 EDT Ventricular Rate:  103 PR Interval:  232 QRS Duration: 90 QT Interval:  376 QTC Calculation: 492 R Axis:   -80 Text Interpretation: Unusual P axis, possible ectopic atrial tachycardia with frequent Premature ventricular complexes in a pattern of bigeminy Left axis deviation T wave abnormality, consider lateral ischemia Abnormal ECG Fib/flutter compared to previous Confirmed by Theotis Burrow [phone]) on 04/01/2021 7:45:13 PM   Radiology DG Chest 2 View  Result Date: 04/01/2021 CLINICAL DATA:  Chest pain. EXAM: CHEST - 2 VIEW COMPARISON:  06/15/2018, lung bases from abdominal CT 06/04/2014 FINDINGS: Mild cardiomegaly. Stable mediastinal contours with aortic atherosclerosis. Blebs in the right lower lobe, as seen on lung bases from prior abdominal CT. Minimal left basilar defined opacity. No significant pleural effusion. No pneumothorax. No pulmonary edema. IMPRESSION: 1. Minimal left basilar defined opacity may be atelectasis or pneumonia. 2. Right basilar blebs. 3. Mild cardiomegaly.  Aortic Atherosclerosis (ICD10-I70.0). Electronically Signed   By: Keith Rake M.D.   On: 04/01/2021 19:16   Medications Ordered in ED Medications - No data to display  ED Course  I have reviewed the triage vital signs and the nursing notes.  Pertinent labs & imaging results that were available during my care of the patient were reviewed by me and considered in my medical decision making (see chart for details).    MDM Rules/Calculators/A&P  Mr. Daubert is a 87  year old male presenting for lower abdominal pain with inspiration that started this afternoon. Initially noted to have CC complaint of chest pain. Patient has a history of dementia and unable to provide history. Per daughter, son found patient with abdominal pain this afternoon. Noted to be afib on EKG in ED. Has a history of atrial fibrillation per Cardiology notes and not on anticoagulation. History limited due to dementia. Vitals stable. CBC and CMP unremarkable. Will check CT abdomen and pelvis for possible GI etiology. UTI also a possibility but unable to obtain sample. CT without acute abnormality. Patient becoming confused and pulling out lines in the room, likely developing delirium in unfamiliar area given history of dementia. Daughter feels that patient's pain is improving and would rather patient be discharge home. Advised follow up with PCP for further evaluation Return precautions given if patient develops worsening pain, inability to tolerate PO intake, nausea, vomiting, or fever.  Final Clinical Impression(s) / ED Diagnoses Final diagnoses:  None    Rx / DC Orders ED Discharge Orders    None       Iona Beard, MD 04/01/21 2237    Rex Kras Wenda Overland, MD 04/01/21 2351

## 2021-04-01 NOTE — ED Notes (Signed)
Patient transported to CT 

## 2021-04-02 ENCOUNTER — Other Ambulatory Visit: Payer: Self-pay | Admitting: Internal Medicine

## 2021-04-21 ENCOUNTER — Ambulatory Visit: Payer: PPO | Admitting: Podiatry

## 2021-04-29 ENCOUNTER — Ambulatory Visit (INDEPENDENT_AMBULATORY_CARE_PROVIDER_SITE_OTHER): Payer: PPO | Admitting: Internal Medicine

## 2021-04-29 ENCOUNTER — Other Ambulatory Visit: Payer: Self-pay

## 2021-04-29 ENCOUNTER — Encounter: Payer: Self-pay | Admitting: Internal Medicine

## 2021-04-29 VITALS — BP 112/64 | HR 78 | Ht 71.0 in | Wt 129.8 lb

## 2021-04-29 DIAGNOSIS — I4892 Unspecified atrial flutter: Secondary | ICD-10-CM | POA: Diagnosis not present

## 2021-04-29 DIAGNOSIS — I2510 Atherosclerotic heart disease of native coronary artery without angina pectoris: Secondary | ICD-10-CM

## 2021-04-29 DIAGNOSIS — I10 Essential (primary) hypertension: Secondary | ICD-10-CM | POA: Diagnosis not present

## 2021-04-29 NOTE — Patient Instructions (Signed)

## 2021-04-29 NOTE — Progress Notes (Signed)
OFFICE NOTE  Chief Complaint:  Hospital follow-up, atrial flutter  Primary Care Physician: Robert Orn, MD  HPI:  Robert [name] is a 87 year-old gentleman with a history of remote inferior MI in the 58s. Catheterization in 1987 showed an occluded marginal branch of the RCA. He was treated medically at that time, but in 2004, he had worsening coronary disease and intervention in the circumflex with a Taxus stent. Since them, he has shown nonreversible defects on his Myoviews with a perfusion defect in the inferoseptal walls consistent with a prior scar. EF is 51%. He was recently in the hospital with a small-bowel obstruction and a CT angiogram was performed, which showed no pulmonary emboli. He also has a history of aortic stent graft, which was placed in 2008. Recently, he had felt funny, but could not describe it much better. He was found to be hypertensive and Robert [name] increased his lisinopril to 30 mg daily and did not recommend any further treatment. Today he returns with persistently elevated blood pressure, 184/104. The blood pressure on a recheck was 150/80. Weight has been stable at around 162 to 163 pounds. At his last office visit I increased his lisinopril to 40 mg daily and his blood pressure now is much better controlled. He reports being asymptomatic and denies any chest pain worsening shortness of breath. His weight has been stable if not down a few pounds.  Robert [name] returns today for follow-up. He occasionally gets some shortness of breath which she relates to asthma, but does not have an inhaler. He denies any weight gain, in fact she's lost about 20 pounds. Is notable that his blood pressure is borderline low today at 102/68. He scheduled to see his primary care provider later this week for a full physical.  Robert [name] was seen in the office today. Overall he is doing fairly well but continues to lose weight. His weight is now down to 141 pounds with a BMI of 19. He  says he rarely eats breakfast and eats very lightly throughout the day. There is been no concern for malignancy or other reason for his weight loss from his primary care provider. Because of his weight loss, however we've been reducing his medications. At his last office visit I talked about lowering his lisinopril further and this may be necessary as his blood pressure today is below 270 systolic. Heart rate actually is low as well in the 40s. Reviewing his EKG shows a trend toward bradycardia over the past several years. He reports some occasional dizziness and fatigue and had one episode of chest discomfort at rest which sounded somewhat atypical for angina.  Robert [name] returns today for follow-up of his monitor. He wore this for bradycardia. This demonstrated a sinus bradycardia with a heart rate as low as 39 mostly at night. He does not recall any symptoms related to this. He says some of his dizziness has improved. I asked to see if his eye doctor would stop his [place] eyedrops and he subsequently been switched to another type of eyedrop. Heart rate is somewhat higher today at 55, therefore this medication may have been affecting heart rate. He should certainly avoid any AV nodal blocking agents, but I do not see a clear indication for pacemaker at this time.  06/23/2016  Robert [name] is seen today back in follow-up. His wife reported that he's been having more frequent palpitations and made an appointment. EKG today shows sinus rhythm with frequent PVCs at 88.  He's been off of all beta blockers due to bradycardia with heart rate as low as the 30s in the past. Now these having breakthrough PVCs I'm concerned about either ischemic etiology, or related to prior scar with his MI in the past. He could also have a cardiomyopathy causing his symptoms. He does report some fatigue and chest discomfort as well. Options for management of his PVCs are limited given bradycardia in the past with beta blockers and he  may be only a candidate for either antiarrhythmic therapy or ablation if his PVCs are not ischemically mediated.  07/16/2016  Robert [name] seen today in follow-up. He now reports that he is asymptomatic with PVCs. Although he has some cardiomyopathy was thought that these could be related to PVCs he was not found to be having significant PVCs on EKG when he recently saw Dr. Curt [name]. He ordered a 48 hour Holter monitor which is yet to be placed. Follow-up with Dr. Curt [name] is in November.   01/21/2017  Robert [name] is reporting some fatigue and occasional palpitations. He's been seeing  Dr. Curt [name] who did monitoring and felt that he had an insignificant number of PVCs. Based on this he has not been started on antiarrhythmic therapy. At times though he continues to have some palpitations which he feels fatigued with. Blood pressure is normal 126/66 today. He is on lisinopril but not on any AV nodal blocking medications. Heart rate at rest is 58.  09/12/2017  Robert [name] returns today for follow-up.  He was recently hospitalized for chest pain.  He underwent a nuclear stress test which was negative for ischemia and showed normal LV function.  An echo also showed normal LVEF however there was mild to moderate aortic regurgitation and mild right atrial enlargement.  04/29/2021  Robert [name] is seen today in follow-up with his daughter Robert [name].  Unfortunately was just in the ER with symptoms of generalized abdominal pain.  EKG showed rate controlled atrial flutter.  He has a remote history of A. fib and had worn a monitor back in 2017 which showed no evidence of that.  He was not anticoagulated due to history of significant epistaxis in the past when he was previously anticoagulated.  He is now referred back for evaluation management of atrial flutter.  EKG in the hospital showed a typical atrial flutter and a repeat EKG today again shows atrial flutter with some PVCs at a rate of 78.  According to his daughter  he does not seem symptomatic or any worsened by the flutter.  He has had progressive dementia however and worsening hearing loss.  He has had some documented falls and has refused to use a walker or walking assistance at home.  PMHx:  Past Medical History:  Diagnosis Date   Atrial fibrillation ([place])    CAD (coronary artery disease)    DES to Cfx   Diabetes mellitus without complication ([place])    Fall at home 06/15/2018   History of nuclear stress test 02/2012   lexiscan; mild-mod perfusion defect in basal inferoseptal, mid inferoseptal apical anterior; no change from previous study, abnormal test    Hyperlipidemia    Hypertension    Myocardial infarction ([place]) 1980s   PAD (peripheral artery disease) ([place])    aortic stent graft 2008   PUD (peptic ulcer disease)    Small bowel obstruction ([place]) 10/2011   Ulcer     Past Surgical History:  Procedure Laterality Date   [place]   occluded  marginal branch of RCA, treated medically - Dr Melvern Banker   CORONARY ANGIOPLASTY WITH STENT PLACEMENT  03/2003   2.5x54mm Taxus DES to Cfx   ESOPHAGOGASTRODUODENOSCOPY N/A 03/14/2014   Procedure: ESOPHAGOGASTRODUODENOSCOPY (EGD) with Balloon Dilation;  Surgeon: Garlan Fair, MD;  Location: [facility];  Service: Endoscopy;  Laterality: N/A;   EYE SURGERY Right    cataract surgery   GASTRECTOMY  50 yrs ago - 2 operations   had surgery at the age of 70 for the ulcer   ILIAC ARTERY ANEURYSM REPAIR     Repaied by Dr. Kellie Simmering april 2009   JOINT REPLACEMENT Left    hip replacement   TRANSTHORACIC ECHOCARDIOGRAM  11/2009   EF=>55%, mild conc LVH, normal LV systolic function; mild MR; mild-mod TR with RV systolic pressure elevated at 30-60mmHg; mild-mod AV regurg; mild pulm valve regurg    FAMHx:  Family History  Problem Relation Age of Onset   Stroke Father    Heart disease Father    Hypertension Father    Heart disease Daughter        Before age 68   Diabetes Sister         DVT-Blood clots in vein    SOCHx:   reports that he quit smoking about 28 years ago. His smoking use included cigarettes. He has never used smokeless tobacco. He reports that he does not drink alcohol and does not use drugs.  ALLERGIES:  Allergies  Allergen Reactions   Asa W/Codeine [Aspirin-Codeine] Other (See Comments)    Irritates his water system (urination burning)    ROS: Pertinent items noted in HPI and remainder of comprehensive ROS otherwise negative.  HOME MEDS: Current Outpatient Medications  Medication Sig Dispense Refill   atorvastatin (LIPITOR) 10 MG tablet Take 1 tablet by mouth daily.     brimonidine (ALPHAGAN P) 0.1 % SOLN Place 1 drop into both eyes daily.      brimonidine-timolol (COMBIGAN) 0.2-0.5 % ophthalmic solution INSTILL 1 DROP INTO EACH EYE 2 TIMES DAILY     clopidogrel (PLAVIX) 75 MG tablet TAKE 1 TABLET(75 MG) BY MOUTH DAILY 60 tablet 0   Colchicine 0.6 MG CAPS 1 capsule     Ferrous Sulfate (IRON) 325 (65 Fe) MG TABS Take 1 tablet by mouth daily.     furosemide (LASIX) 40 MG tablet TAKE 1 TABLET(40 MG) BY MOUTH DAILY 90 tablet 3   indomethacin (INDOCIN) 50 MG capsule 1 capsule with food or milk     Iron, Ferrous Sulfate, 325 (65 Fe) MG TABS 1 tablet     latanoprost (XALATAN) 0.005 % ophthalmic solution Place 1 drop into the left eye at bedtime.      lisinopril (ZESTRIL) 20 MG tablet TAKE 1 TABLET(20 MG) BY MOUTH DAILY 60 tablet 0   metFORMIN (GLUCOPHAGE) 500 MG tablet Take 500 mg by mouth 2 (two) times daily.  1   metoprolol tartrate (LOPRESSOR) 25 MG tablet Take 25 mg by mouth 2 (two) times daily.     potassium chloride (KLOR-CON) 10 MEQ tablet Take 2 tablets (20 mEq total) by mouth daily. 90 tablet 3   potassium chloride (KLOR-CON) 10 MEQ tablet Take 1 tablet by mouth daily.     tamsulosin (FLOMAX) 0.4 MG CAPS capsule Take 1 capsule by mouth daily.     vitamin B-12 (CYANOCOBALAMIN) 1000 MCG tablet 1 tablet     No current facility-administered  medications for this visit.    LABS/IMAGING: No results found for this or any previous visit (  from the past 48 hour(s)). No results found.  VITALS: Pulse 78   Ht 5\' 11"  (1.803 m)   Wt 129 lb 12.8 oz (58.9 kg)   SpO2 97%   BMI 18.10 kg/m   EXAM: General appearance: appears older than stated age, cachectic, and somnolent Neck: no carotid bruit, no JVD, and thyroid not enlarged, symmetric, no tenderness/mass/nodules Lungs: clear to auscultation bilaterally Heart: regular rate and rhythm, S1, S2 normal, and diastolic murmur: early diastolic 2/6, crescendo at lower left sternal border Abdomen: soft, non-tender; bowel sounds normal; no masses,  no organomegaly Extremities: extremities normal, atraumatic, no cyanosis or edema Pulses: 2+ and symmetric Skin: Skin color, texture, turgor normal. No rashes or lesions Neurologic: Grossly normal Psych: Pleasant  EKG: Atrial flutter with PVCs at 78, lateral T wave changes-personally reviewed  ASSESSMENT: Rate controlled atrial flutter-not an anticoagulation candidate Symptomatic PVCs Fatigue Coronary disease with a history of remote inferior MI History PCI to circumflex and known occluded right coronary artery Dyslipidemia - followed by Dr. Laurann [place] Hypertension - at goal History of stent graft for AAA 6.   Sinus bradycardia - asymptomatic, improved off b-blockers 7.   Mild to moderate aortic insufficiency  PLAN: 1.   Mr. Tuminello is in rate controlled atrial flutter but seems asymptomatic.  He is not an anticoagulation candidate.  He has a remote history of A. fib and was anticoagulated but had nosebleeds and given his progressive dementia, poor gait and instability as well as falls at home, after discussion with his daughter we both feel that he is not a good candidate for anticoagulation.  She does understand that atrial flutter would increase his risk of stroke however I feel that his risk of bleeding, falls or issues related  anticoagulation is worse than the potential stroke risk reduction.  She is in agreement with that.  Based on that plan we will continue with rate control regarding atrial flutter.  Follow-up in 1 year or sooner as necessary.  Pixie Casino, MD, [facility], [place] Director of the [facility] Lipid [facility] &  Cardiovascular Risk Reduction Clinic Diplomate of the American Board of Clinical Lipidology Attending Cardiologist  Direct Dial: [phone]  Fax: [phone]  Website:  [email or web address] [email or web address] Chalker 04/29/2021, 11:07 AM

## 2021-05-14 DIAGNOSIS — N400 Enlarged prostate without lower urinary tract symptoms: Secondary | ICD-10-CM | POA: Diagnosis not present

## 2021-05-14 DIAGNOSIS — I739 Peripheral vascular disease, unspecified: Secondary | ICD-10-CM | POA: Diagnosis not present

## 2021-05-14 DIAGNOSIS — I10 Essential (primary) hypertension: Secondary | ICD-10-CM | POA: Diagnosis not present

## 2021-05-14 DIAGNOSIS — E1151 Type 2 diabetes mellitus with diabetic peripheral angiopathy without gangrene: Secondary | ICD-10-CM | POA: Diagnosis not present

## 2021-05-14 DIAGNOSIS — I2510 Atherosclerotic heart disease of native coronary artery without angina pectoris: Secondary | ICD-10-CM | POA: Diagnosis not present

## 2021-06-07 ENCOUNTER — Other Ambulatory Visit: Payer: Self-pay | Admitting: Internal Medicine

## 2021-06-08 ENCOUNTER — Other Ambulatory Visit: Payer: Self-pay | Admitting: Internal Medicine

## 2021-06-08 DIAGNOSIS — N400 Enlarged prostate without lower urinary tract symptoms: Secondary | ICD-10-CM | POA: Diagnosis not present

## 2021-06-08 DIAGNOSIS — J4520 Mild intermittent asthma, uncomplicated: Secondary | ICD-10-CM | POA: Diagnosis not present

## 2021-06-08 DIAGNOSIS — D508 Other iron deficiency anemias: Secondary | ICD-10-CM | POA: Diagnosis not present

## 2021-06-08 DIAGNOSIS — I2510 Atherosclerotic heart disease of native coronary artery without angina pectoris: Secondary | ICD-10-CM | POA: Diagnosis not present

## 2021-06-08 DIAGNOSIS — H409 Unspecified glaucoma: Secondary | ICD-10-CM | POA: Diagnosis not present

## 2021-06-08 DIAGNOSIS — E1151 Type 2 diabetes mellitus with diabetic peripheral angiopathy without gangrene: Secondary | ICD-10-CM | POA: Diagnosis not present

## 2021-06-08 DIAGNOSIS — I10 Essential (primary) hypertension: Secondary | ICD-10-CM | POA: Diagnosis not present

## 2021-06-08 DIAGNOSIS — E7800 Pure hypercholesterolemia, unspecified: Secondary | ICD-10-CM | POA: Diagnosis not present

## 2021-06-08 DIAGNOSIS — D599 Acquired hemolytic anemia, unspecified: Secondary | ICD-10-CM | POA: Diagnosis not present

## 2021-06-10 ENCOUNTER — Other Ambulatory Visit: Payer: Self-pay | Admitting: Internal Medicine

## 2021-06-10 NOTE — Telephone Encounter (Signed)
Rx(s) sent to pharmacy electronically.  

## 2021-07-09 ENCOUNTER — Other Ambulatory Visit: Payer: Self-pay | Admitting: Internal Medicine

## 2021-08-23 DIAGNOSIS — J4520 Mild intermittent asthma, uncomplicated: Secondary | ICD-10-CM | POA: Diagnosis not present

## 2021-08-23 DIAGNOSIS — I2510 Atherosclerotic heart disease of native coronary artery without angina pectoris: Secondary | ICD-10-CM | POA: Diagnosis not present

## 2021-08-23 DIAGNOSIS — E1151 Type 2 diabetes mellitus with diabetic peripheral angiopathy without gangrene: Secondary | ICD-10-CM | POA: Diagnosis not present

## 2021-08-23 DIAGNOSIS — H409 Unspecified glaucoma: Secondary | ICD-10-CM | POA: Diagnosis not present

## 2021-08-23 DIAGNOSIS — N400 Enlarged prostate without lower urinary tract symptoms: Secondary | ICD-10-CM | POA: Diagnosis not present

## 2021-08-23 DIAGNOSIS — D599 Acquired hemolytic anemia, unspecified: Secondary | ICD-10-CM | POA: Diagnosis not present

## 2021-08-23 DIAGNOSIS — I10 Essential (primary) hypertension: Secondary | ICD-10-CM | POA: Diagnosis not present

## 2021-08-23 DIAGNOSIS — E7800 Pure hypercholesterolemia, unspecified: Secondary | ICD-10-CM | POA: Diagnosis not present

## 2021-08-23 DIAGNOSIS — D508 Other iron deficiency anemias: Secondary | ICD-10-CM | POA: Diagnosis not present

## 2021-10-01 ENCOUNTER — Other Ambulatory Visit: Payer: Self-pay

## 2021-10-01 ENCOUNTER — Ambulatory Visit: Payer: PPO | Admitting: Podiatry

## 2021-10-01 DIAGNOSIS — B351 Tinea unguium: Secondary | ICD-10-CM

## 2021-10-01 DIAGNOSIS — M79675 Pain in left toe(s): Secondary | ICD-10-CM | POA: Diagnosis not present

## 2021-10-01 DIAGNOSIS — M79674 Pain in right toe(s): Secondary | ICD-10-CM | POA: Diagnosis not present

## 2021-10-05 ENCOUNTER — Encounter: Payer: Self-pay | Admitting: Podiatry

## 2021-10-05 NOTE — Progress Notes (Signed)
,    Subjective:  Patient ID: Robert [name], male    DOB: 08/05/1932,  MRN: [id number]  Chief Complaint  Patient presents with   Nail Problem     Routine foot care    87 y.o. male presents with the above complaint. History confirmed with patient.  Has not been to see me in almost a months now no new issues A1c has been controlled  Objective:  Physical Exam: warm, good capillary refill, no trophic changes or ulcerative lesions, normal DP and PT pulses and normal sensory exam.  Thickened elongated incurvated nails x10 with brown discoloration and subungual debris Assessment:   1. Pain due to onychomycosis of toenails of both feet      Plan:  Patient was evaluated and treated and all questions answered.   Patient educated on diabetes. Discussed proper diabetic foot care and discussed risks and complications of disease. Educated patient in depth on reasons to return to the office immediately should he/she discover anything concerning or new on the feet. All questions answered. Discussed proper shoes as well.   Discussed the etiology and treatment options for the condition in detail with the patient. Educated patient on the topical and oral treatment options for mycotic nails. Recommended debridement of the nails today. Sharp and mechanical debridement performed of all painful and mycotic nails today. Nails debrided in length and thickness using a nail nipper to level of comfort. Discussed treatment options including appropriate shoe gear. Follow up as needed for painful nails.    Return in about 3 months (around 12/30/2021) for painful nails .

## 2021-10-19 ENCOUNTER — Other Ambulatory Visit: Payer: Self-pay | Admitting: Internal Medicine

## 2021-12-31 ENCOUNTER — Ambulatory Visit: Payer: PPO | Admitting: Podiatry

## 2021-12-31 DIAGNOSIS — I2510 Atherosclerotic heart disease of native coronary artery without angina pectoris: Secondary | ICD-10-CM | POA: Diagnosis not present

## 2021-12-31 DIAGNOSIS — Z23 Encounter for immunization: Secondary | ICD-10-CM | POA: Diagnosis not present

## 2021-12-31 DIAGNOSIS — R350 Frequency of micturition: Secondary | ICD-10-CM | POA: Diagnosis not present

## 2021-12-31 DIAGNOSIS — E7800 Pure hypercholesterolemia, unspecified: Secondary | ICD-10-CM | POA: Diagnosis not present

## 2021-12-31 DIAGNOSIS — I484 Atypical atrial flutter: Secondary | ICD-10-CM | POA: Diagnosis not present

## 2021-12-31 DIAGNOSIS — Z1389 Encounter for screening for other disorder: Secondary | ICD-10-CM | POA: Diagnosis not present

## 2021-12-31 DIAGNOSIS — H900 Conductive hearing loss, bilateral: Secondary | ICD-10-CM | POA: Diagnosis not present

## 2021-12-31 DIAGNOSIS — Z8639 Personal history of other endocrine, nutritional and metabolic disease: Secondary | ICD-10-CM | POA: Diagnosis not present

## 2021-12-31 DIAGNOSIS — Z0000 Encounter for general adult medical examination without abnormal findings: Secondary | ICD-10-CM | POA: Diagnosis not present

## 2021-12-31 DIAGNOSIS — I10 Essential (primary) hypertension: Secondary | ICD-10-CM | POA: Diagnosis not present

## 2021-12-31 DIAGNOSIS — E1151 Type 2 diabetes mellitus with diabetic peripheral angiopathy without gangrene: Secondary | ICD-10-CM | POA: Diagnosis not present

## 2021-12-31 DIAGNOSIS — I739 Peripheral vascular disease, unspecified: Secondary | ICD-10-CM | POA: Diagnosis not present

## 2021-12-31 DIAGNOSIS — N401 Enlarged prostate with lower urinary tract symptoms: Secondary | ICD-10-CM | POA: Diagnosis not present

## 2022-01-07 DIAGNOSIS — H6123 Impacted cerumen, bilateral: Secondary | ICD-10-CM | POA: Diagnosis not present

## 2022-01-14 ENCOUNTER — Other Ambulatory Visit: Payer: Self-pay

## 2022-01-14 ENCOUNTER — Ambulatory Visit (INDEPENDENT_AMBULATORY_CARE_PROVIDER_SITE_OTHER): Payer: PPO | Admitting: Podiatry

## 2022-01-14 DIAGNOSIS — E119 Type 2 diabetes mellitus without complications: Secondary | ICD-10-CM

## 2022-01-14 DIAGNOSIS — M79674 Pain in right toe(s): Secondary | ICD-10-CM | POA: Diagnosis not present

## 2022-01-14 DIAGNOSIS — M79675 Pain in left toe(s): Secondary | ICD-10-CM

## 2022-01-14 DIAGNOSIS — B351 Tinea unguium: Secondary | ICD-10-CM | POA: Diagnosis not present

## 2022-01-18 NOTE — Progress Notes (Signed)
,  ?  Subjective:  ?Patient ID: Robert [name], male    DOB: Nov 30, 1931,  MRN: [id number] ? ?Chief Complaint  ?Patient presents with  ? Nail Problem  ?  Thick painful toenails, 3 month follow up   ? Diabetes  ?  A1C  7.0  ? ? ?88 y.o. male presents with the above complaint. History confirmed with patient.  Overall doing well no new issues ? ?Objective:  ?Physical Exam: ?warm, good capillary refill, no trophic changes or ulcerative lesions, normal DP and PT pulses and normal sensory exam.  Thickened elongated incurvated nails x10 with brown discoloration and subungual debris ?Assessment:  ? ?1. Pain due to onychomycosis of toenails of both feet   ?2. Type 2 diabetes mellitus without complication, without long-term current use of insulin ([place])   ? ? ? ?Plan:  ?Patient was evaluated and treated and all questions answered. ? ? ?Patient educated on diabetes. Discussed proper diabetic foot care and discussed risks and complications of disease. Educated patient in depth on reasons to return to the office immediately should he/she discover anything concerning or new on the feet. All questions answered. Discussed proper shoes as well.  ? ?Discussed the etiology and treatment options for the condition in detail with the patient. Educated patient on the topical and oral treatment options for mycotic nails. Recommended debridement of the nails today. Sharp and mechanical debridement performed of all painful and mycotic nails today. Nails debrided in length and thickness using a nail nipper to level of comfort. Discussed treatment options including appropriate shoe gear. Follow up as needed for painful nails. ? ? ? ?Return in about 3 months (around 04/16/2022) for at risk foot care.  ? ?

## 2022-04-15 ENCOUNTER — Ambulatory Visit: Payer: PPO | Admitting: Podiatry

## 2022-05-06 ENCOUNTER — Emergency Department (HOSPITAL_COMMUNITY): Payer: PPO

## 2022-05-06 ENCOUNTER — Other Ambulatory Visit: Payer: Self-pay

## 2022-05-06 ENCOUNTER — Encounter (HOSPITAL_COMMUNITY): Payer: Self-pay | Admitting: Emergency Medicine

## 2022-05-06 ENCOUNTER — Inpatient Hospital Stay (HOSPITAL_COMMUNITY)
Admission: EM | Admit: 2022-05-06 | Discharge: 2022-05-13 | DRG: 175 | Disposition: A | Discharge status: Hospice | Payer: PPO | Attending: Family Medicine | Admitting: Family Medicine

## 2022-05-06 DIAGNOSIS — I252 Old myocardial infarction: Secondary | ICD-10-CM | POA: Diagnosis not present

## 2022-05-06 DIAGNOSIS — I959 Hypotension, unspecified: Secondary | ICD-10-CM | POA: Diagnosis present

## 2022-05-06 DIAGNOSIS — N400 Enlarged prostate without lower urinary tract symptoms: Secondary | ICD-10-CM | POA: Diagnosis not present

## 2022-05-06 DIAGNOSIS — J189 Pneumonia, unspecified organism: Secondary | ICD-10-CM | POA: Diagnosis present

## 2022-05-06 DIAGNOSIS — E1169 Type 2 diabetes mellitus with other specified complication: Secondary | ICD-10-CM | POA: Diagnosis not present

## 2022-05-06 DIAGNOSIS — Z87891 Personal history of nicotine dependence: Secondary | ICD-10-CM

## 2022-05-06 DIAGNOSIS — E46 Unspecified protein-calorie malnutrition: Secondary | ICD-10-CM | POA: Diagnosis present

## 2022-05-06 DIAGNOSIS — E119 Type 2 diabetes mellitus without complications: Secondary | ICD-10-CM

## 2022-05-06 DIAGNOSIS — K831 Obstruction of bile duct: Secondary | ICD-10-CM | POA: Diagnosis present

## 2022-05-06 DIAGNOSIS — J439 Emphysema, unspecified: Secondary | ICD-10-CM | POA: Diagnosis not present

## 2022-05-06 DIAGNOSIS — Z955 Presence of coronary angioplasty implant and graft: Secondary | ICD-10-CM

## 2022-05-06 DIAGNOSIS — R531 Weakness: Secondary | ICD-10-CM | POA: Diagnosis not present

## 2022-05-06 DIAGNOSIS — R1310 Dysphagia, unspecified: Secondary | ICD-10-CM | POA: Diagnosis present

## 2022-05-06 DIAGNOSIS — F0394 Unspecified dementia, unspecified severity, with anxiety: Secondary | ICD-10-CM | POA: Diagnosis not present

## 2022-05-06 DIAGNOSIS — K869 Disease of pancreas, unspecified: Secondary | ICD-10-CM | POA: Diagnosis present

## 2022-05-06 DIAGNOSIS — Z66 Do not resuscitate: Secondary | ICD-10-CM | POA: Diagnosis present

## 2022-05-06 DIAGNOSIS — I110 Hypertensive heart disease with heart failure: Secondary | ICD-10-CM | POA: Diagnosis not present

## 2022-05-06 DIAGNOSIS — Z711 Person with feared health complaint in whom no diagnosis is made: Secondary | ICD-10-CM | POA: Diagnosis not present

## 2022-05-06 DIAGNOSIS — Z833 Family history of diabetes mellitus: Secondary | ICD-10-CM

## 2022-05-06 DIAGNOSIS — I2699 Other pulmonary embolism without acute cor pulmonale: Secondary | ICD-10-CM | POA: Diagnosis present

## 2022-05-06 DIAGNOSIS — K8689 Other specified diseases of pancreas: Secondary | ICD-10-CM | POA: Diagnosis present

## 2022-05-06 DIAGNOSIS — Z789 Other specified health status: Secondary | ICD-10-CM | POA: Diagnosis not present

## 2022-05-06 DIAGNOSIS — H1089 Other conjunctivitis: Secondary | ICD-10-CM | POA: Diagnosis present

## 2022-05-06 DIAGNOSIS — R634 Abnormal weight loss: Secondary | ICD-10-CM | POA: Diagnosis not present

## 2022-05-06 DIAGNOSIS — J984 Other disorders of lung: Secondary | ICD-10-CM | POA: Diagnosis not present

## 2022-05-06 DIAGNOSIS — N281 Cyst of kidney, acquired: Secondary | ICD-10-CM | POA: Diagnosis not present

## 2022-05-06 DIAGNOSIS — B9689 Other specified bacterial agents as the cause of diseases classified elsewhere: Secondary | ICD-10-CM | POA: Diagnosis present

## 2022-05-06 DIAGNOSIS — E876 Hypokalemia: Secondary | ICD-10-CM | POA: Diagnosis not present

## 2022-05-06 DIAGNOSIS — E320 Persistent hyperplasia of thymus: Secondary | ICD-10-CM | POA: Diagnosis present

## 2022-05-06 DIAGNOSIS — E782 Mixed hyperlipidemia: Secondary | ICD-10-CM | POA: Diagnosis present

## 2022-05-06 DIAGNOSIS — I482 Chronic atrial fibrillation, unspecified: Secondary | ICD-10-CM | POA: Diagnosis not present

## 2022-05-06 DIAGNOSIS — Z96642 Presence of left artificial hip joint: Secondary | ICD-10-CM | POA: Diagnosis present

## 2022-05-06 DIAGNOSIS — R627 Adult failure to thrive: Secondary | ICD-10-CM | POA: Diagnosis not present

## 2022-05-06 DIAGNOSIS — I2694 Multiple subsegmental pulmonary emboli without acute cor pulmonale: Principal | ICD-10-CM | POA: Diagnosis present

## 2022-05-06 DIAGNOSIS — Z515 Encounter for palliative care: Secondary | ICD-10-CM

## 2022-05-06 DIAGNOSIS — Z7189 Other specified counseling: Secondary | ICD-10-CM | POA: Diagnosis not present

## 2022-05-06 DIAGNOSIS — E1151 Type 2 diabetes mellitus with diabetic peripheral angiopathy without gangrene: Secondary | ICD-10-CM | POA: Diagnosis present

## 2022-05-06 DIAGNOSIS — R64 Cachexia: Secondary | ICD-10-CM | POA: Diagnosis not present

## 2022-05-06 DIAGNOSIS — I2510 Atherosclerotic heart disease of native coronary artery without angina pectoris: Secondary | ICD-10-CM | POA: Diagnosis not present

## 2022-05-06 DIAGNOSIS — L89151 Pressure ulcer of sacral region, stage 1: Secondary | ICD-10-CM | POA: Diagnosis present

## 2022-05-06 DIAGNOSIS — I10 Essential (primary) hypertension: Secondary | ICD-10-CM | POA: Diagnosis not present

## 2022-05-06 DIAGNOSIS — R001 Bradycardia, unspecified: Secondary | ICD-10-CM | POA: Diagnosis not present

## 2022-05-06 DIAGNOSIS — K828 Other specified diseases of gallbladder: Secondary | ICD-10-CM | POA: Diagnosis not present

## 2022-05-06 DIAGNOSIS — E1165 Type 2 diabetes mellitus with hyperglycemia: Secondary | ICD-10-CM | POA: Diagnosis not present

## 2022-05-06 DIAGNOSIS — Z79899 Other long term (current) drug therapy: Secondary | ICD-10-CM

## 2022-05-06 DIAGNOSIS — Z7984 Long term (current) use of oral hypoglycemic drugs: Secondary | ICD-10-CM

## 2022-05-06 DIAGNOSIS — R911 Solitary pulmonary nodule: Secondary | ICD-10-CM | POA: Diagnosis not present

## 2022-05-06 DIAGNOSIS — I5032 Chronic diastolic (congestive) heart failure: Secondary | ICD-10-CM | POA: Diagnosis not present

## 2022-05-06 DIAGNOSIS — E11649 Type 2 diabetes mellitus with hypoglycemia without coma: Secondary | ICD-10-CM | POA: Diagnosis present

## 2022-05-06 DIAGNOSIS — I714 Abdominal aortic aneurysm, without rupture, unspecified: Secondary | ICD-10-CM | POA: Diagnosis present

## 2022-05-06 DIAGNOSIS — H109 Unspecified conjunctivitis: Secondary | ICD-10-CM | POA: Diagnosis not present

## 2022-05-06 DIAGNOSIS — L8990 Pressure ulcer of unspecified site, unspecified stage: Secondary | ICD-10-CM | POA: Insufficient documentation

## 2022-05-06 DIAGNOSIS — Z8711 Personal history of peptic ulcer disease: Secondary | ICD-10-CM

## 2022-05-06 DIAGNOSIS — R932 Abnormal findings on diagnostic imaging of liver and biliary tract: Secondary | ICD-10-CM | POA: Diagnosis not present

## 2022-05-06 DIAGNOSIS — H9193 Unspecified hearing loss, bilateral: Secondary | ICD-10-CM | POA: Diagnosis present

## 2022-05-06 DIAGNOSIS — Z681 Body mass index (BMI) 19 or less, adult: Secondary | ICD-10-CM | POA: Diagnosis not present

## 2022-05-06 DIAGNOSIS — K862 Cyst of pancreas: Secondary | ICD-10-CM | POA: Diagnosis not present

## 2022-05-06 DIAGNOSIS — R748 Abnormal levels of other serum enzymes: Secondary | ICD-10-CM | POA: Diagnosis not present

## 2022-05-06 DIAGNOSIS — Z8249 Family history of ischemic heart disease and other diseases of the circulatory system: Secondary | ICD-10-CM

## 2022-05-06 DIAGNOSIS — Z885 Allergy status to narcotic agent status: Secondary | ICD-10-CM

## 2022-05-06 DIAGNOSIS — Z95828 Presence of other vascular implants and grafts: Secondary | ICD-10-CM

## 2022-05-06 DIAGNOSIS — C799 Secondary malignant neoplasm of unspecified site: Secondary | ICD-10-CM | POA: Diagnosis present

## 2022-05-06 DIAGNOSIS — Z7902 Long term (current) use of antithrombotics/antiplatelets: Secondary | ICD-10-CM

## 2022-05-06 LAB — BASIC METABOLIC PANEL
Anion gap: 15 (ref 5–15)
BUN: 17 mg/dL (ref 8–23)
CO2: 17 mmol/L — ABNORMAL LOW (ref 22–32)
Calcium: 8.5 mg/dL — ABNORMAL LOW (ref 8.9–10.3)
Chloride: 106 mmol/L (ref 98–111)
Creatinine, Ser: 1.06 mg/dL (ref 0.61–1.24)
GFR, Estimated: >60 mL/min (ref >60)
Glucose, Bld: 123 mg/dL — ABNORMAL HIGH (ref 70–99)
Potassium: 3.6 mmol/L (ref 3.5–5.1)
Sodium: 138 mmol/L (ref 135–145)

## 2022-05-06 LAB — HEPATIC FUNCTION PANEL
ALT: 74 U/L — ABNORMAL HIGH (ref 0–44)
AST: 82 U/L — ABNORMAL HIGH (ref 15–41)
Albumin: 2.2 g/dL — ABNORMAL LOW (ref 3.5–5.0)
Alkaline Phosphatase: 440 U/L — ABNORMAL HIGH (ref 38–126)
Bilirubin, Direct: 6.9 mg/dL — ABNORMAL HIGH (ref 0.0–0.2)
Indirect Bilirubin: 5 mg/dL — ABNORMAL HIGH (ref 0.3–0.9)
Total Bilirubin: 11.9 mg/dL — ABNORMAL HIGH (ref 0.3–1.2)
Total Protein: 6.5 g/dL (ref 6.5–8.1)

## 2022-05-06 LAB — CBC
HCT: 42.5 % (ref 39.0–52.0)
Hemoglobin: 13.2 g/dL (ref 13.0–17.0)
MCH: 30.6 pg (ref 26.0–34.0)
MCHC: 31.1 g/dL (ref 30.0–36.0)
MCV: 98.6 fL (ref 80.0–100.0)
Platelets: 147 10*3/uL — ABNORMAL LOW (ref 150–400)
RBC: 4.31 MIL/uL (ref 4.22–5.81)
RDW: 16.6 % — ABNORMAL HIGH (ref 11.5–15.5)
WBC: 5.4 10*3/uL (ref 4.0–10.5)
nRBC: 0 % (ref 0.0–0.2)

## 2022-05-06 LAB — PROTIME-INR
INR: 1.5 — ABNORMAL HIGH (ref 0.8–1.2)
Prothrombin Time: 17.7 seconds — ABNORMAL HIGH (ref 11.4–15.2)

## 2022-05-06 LAB — TROPONIN I (HIGH SENSITIVITY)
Troponin I (High Sensitivity): 14 ng/L (ref <18)
Troponin I (High Sensitivity): 12 ng/L (ref <18)

## 2022-05-06 MED ORDER — TAMSULOSIN HCL 0.4 MG PO CAPS
0.4000 mg | ORAL_CAPSULE | Freq: Every day | ORAL | Status: DC
  Administered 2022-05-12 – 2022-05-13 (×2): 0.4 mg via ORAL
  Filled 2022-05-06 (×3): qty 1

## 2022-05-06 MED ORDER — LACTATED RINGERS IV BOLUS
1000.0000 mL | Freq: Once | INTRAVENOUS | Status: AC
  Administered 2022-05-06: 1000 mL via INTRAVENOUS

## 2022-05-06 MED ORDER — LACTATED RINGERS IV SOLN: INTRAVENOUS | Status: DC

## 2022-05-06 MED ORDER — ONDANSETRON HCL 4 MG PO TABS: 4.0000 mg | ORAL_TABLET | Freq: Four times a day (QID) | ORAL | Status: DC | PRN

## 2022-05-06 MED ORDER — HEPARIN (PORCINE) 25000 UT/250ML-% IV SOLN
950.0000 [IU]/h | INTRAVENOUS | Status: DC
  Administered 2022-05-06: 900 [IU]/h via INTRAVENOUS
  Administered 2022-05-08: 1050 [IU]/h via INTRAVENOUS
  Administered 2022-05-09 – 2022-05-10 (×2): 1000 [IU]/h via INTRAVENOUS
  Filled 2022-05-06 (×5): qty 250

## 2022-05-06 MED ORDER — HEPARIN BOLUS VIA INFUSION
4000.0000 [IU] | Freq: Once | INTRAVENOUS | Status: AC
  Administered 2022-05-06: 4000 [IU] via INTRAVENOUS
  Filled 2022-05-06: qty 4000

## 2022-05-06 MED ORDER — ACETAMINOPHEN 325 MG PO TABS: 650.0000 mg | ORAL_TABLET | Freq: Four times a day (QID) | ORAL | Status: DC | PRN

## 2022-05-06 MED ORDER — INSULIN ASPART 100 UNIT/ML IJ SOLN
0.0000 [IU] | Freq: Three times a day (TID) | INTRAMUSCULAR | Status: DC
  Administered 2022-05-08: 1 [IU] via SUBCUTANEOUS

## 2022-05-06 MED ORDER — ALBUMIN HUMAN 25 % IV SOLN
25.0000 g | Freq: Once | INTRAVENOUS | Status: AC
Start: 2022-05-06 — End: 2022-05-06
  Administered 2022-05-06: 25 g via INTRAVENOUS
  Filled 2022-05-06: qty 100

## 2022-05-06 MED ORDER — METOPROLOL TARTRATE 25 MG PO TABS: 25.0000 mg | ORAL_TABLET | Freq: Two times a day (BID) | ORAL | Status: DC

## 2022-05-06 MED ORDER — IOHEXOL 350 MG/ML SOLN
65.0000 mL | Freq: Once | INTRAVENOUS | Status: AC | PRN
  Administered 2022-05-06: 65 mL via INTRAVENOUS

## 2022-05-06 MED ORDER — LORAZEPAM 2 MG/ML IJ SOLN
0.5000 mg | Freq: Once | INTRAMUSCULAR | Status: AC
  Administered 2022-05-06: 0.5 mg via INTRAVENOUS
  Filled 2022-05-06: qty 1

## 2022-05-06 MED ORDER — ONDANSETRON HCL 4 MG/2ML IJ SOLN: 4.0000 mg | Freq: Four times a day (QID) | INTRAMUSCULAR | Status: DC | PRN

## 2022-05-06 MED ORDER — POLYETHYLENE GLYCOL 3350 17 G PO PACK: 17.0000 g | PACK | Freq: Every day | ORAL | Status: DC | PRN

## 2022-05-06 MED ORDER — ATORVASTATIN CALCIUM 10 MG PO TABS: 10.0000 mg | ORAL_TABLET | Freq: Every day | ORAL | Status: DC

## 2022-05-06 MED ORDER — ACETAMINOPHEN 650 MG RE SUPP: 650.0000 mg | Freq: Four times a day (QID) | RECTAL | Status: DC | PRN

## 2022-05-06 MED ORDER — IOHEXOL 300 MG/ML  SOLN
75.0000 mL | Freq: Once | INTRAMUSCULAR | Status: AC | PRN
  Administered 2022-05-06: 75 mL via INTRAVENOUS

## 2022-05-06 MED ORDER — LORAZEPAM 2 MG/ML IJ SOLN
0.5000 mg | Freq: Once | INTRAMUSCULAR | Status: AC | PRN
  Administered 2022-05-07: 0.5 mg via INTRAVENOUS
  Filled 2022-05-06: qty 1

## 2022-05-06 NOTE — ED Triage Notes (Signed)
Patient sent to ED from PCP for evaluation of generalized weakness and poor appetitive for the last week and half. BP 80/61 in triage. Patient arouses to voice, daughter states at baseline patient is ambulatory and talkative. Patient in no apparent distress at this time.

## 2022-05-06 NOTE — ED Notes (Signed)
Patient transported to CT 

## 2022-05-06 NOTE — ED Provider Notes (Signed)
[facility] EMERGENCY DEPARTMENT Provider Note  CSN: [id number] Arrival date & time: 05/06/22 1256  Chief Complaint(s) Weakness  HPI Robert [name] is a 88 y.o. male with PMH A-fib, CAD status post MI with DES to the circumflex, T2DM, HTN, HLD, PAD who presents emergency department for evaluation of generalized weakness and hypotension.  Family states that over the last 1 and half weeks he has had a progressive decline in his functional status.  Family states that he has lost a significant amount of weight over the last 3 weeks and states that he has had a issues tolerating p.o. where he will swallow, food will get stuck and he will then have to throw it up.  Patient has a underlying history of dementia and history obtained primarily from patient's family who states that they went to the primary care physician today who informed them that patient will need to come to the emergency department for further evaluation.   Past Medical History Past Medical History:  Diagnosis Date   Atrial fibrillation ([place])    CAD (coronary artery disease)    DES to Cfx   Diabetes mellitus without complication ([employer])    Fall at home 06/15/2018   History of nuclear stress test 02/2012   lexiscan; mild-mod perfusion defect in basal inferoseptal, mid inferoseptal apical anterior; no change from previous study, abnormal test    Hyperlipidemia    Hypertension    Myocardial infarction ([place]) 1980s   PAD (peripheral artery disease) ([place])    aortic stent graft 2008   PUD (peptic ulcer disease)    Small bowel obstruction ([place]) 10/2011   Ulcer    Patient Active Problem List   Diagnosis Date Noted   Fall 06/15/2018   BPH (benign prostatic hyperplasia) 06/15/2018   Microcytic hypochromic anemia 06/15/2018   Hypokalemia 06/15/2018   Abnormal EKG 06/15/2018   Chest pain 08/31/2017   Other fatigue 06/23/2016   Frequent PVCs 06/23/2016   Symptomatic bradycardia 05/20/2015   Pain of left lower  extremity-Left Hip/Leg 06/04/2014   Numbness-Bilat Leg 06/04/2014   Weakness of both legs 06/04/2014   Aftercare following surgery of the circulatory system, NEC 10/02/2013   AAA (abdominal aortic aneurysm) ([place]) 07/18/2012   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 Authorizing Provider  atorvastatin (LIPITOR) 10 MG tablet Take 1 tablet by mouth daily.    [provider]  brimonidine (ALPHAGAN P) 0.1 % SOLN Place 1 drop into both eyes daily.  Patient not taking: Reported on 04/29/2021    [provider]  brimonidine-timolol (COMBIGAN) 0.2-0.5 % ophthalmic solution INSTILL 1 DROP INTO EACH EYE 2 TIMES DAILY Patient not taking: Reported on 04/29/2021    [provider]  clopidogrel (PLAVIX) 75 MG tablet TAKE 1 TABLET(75 MG) BY MOUTH DAILY 06/08/21   Hilty, Nadean Corwin, MD  Colchicine 0.6 MG CAPS 1 capsule Patient not taking: Reported on 04/29/2021 07/04/20   [provider]  Ferrous Sulfate (IRON) 325 (65 Fe) MG TABS Take 1 tablet by mouth daily. 12/31/20   [provider]  furosemide (LASIX) 40 MG tablet TAKE 1 TABLET(40 MG) BY MOUTH  DAILY 10/20/21   Hilty, Nadean Corwin, MD  indomethacin (INDOCIN) 50 MG capsule 1 capsule with food or milk Patient not taking: Reported on 04/29/2021 07/04/20   [provider]  Iron, Ferrous Sulfate, 325 (65 Fe) MG TABS 1 tablet 12/31/20   [provider]  latanoprost (XALATAN) 0.005 % ophthalmic solution Place 1 drop into the left eye at bedtime.  Patient not taking: Reported on 04/29/2021    [provider]  lisinopril (ZESTRIL) 20 MG tablet TAKE 1 TABLET(20 MG) BY MOUTH  DAILY 07/09/21   Hilty, Nadean Corwin, MD  metFORMIN (GLUCOPHAGE) 500 MG tablet Take 500 mg by mouth 2 (two) times daily. 09/04/15   [provider]  metoprolol tartrate (LOPRESSOR) 25 MG tablet Take 25 mg by mouth 2 (two) times daily. 12/25/20   [provider]  potassium chloride (KLOR-CON) 10 MEQ tablet Take 1 tablet by mouth daily.    [provider]  tamsulosin (FLOMAX) 0.4 MG CAPS capsule Take 1 capsule by mouth daily. 04/04/19   [provider]  vitamin B-12 (CYANOCOBALAMIN) 1000 MCG tablet 1 tablet Patient not taking: Reported on 04/29/2021 03/26/16   [provider]                                                                                                                                    Past Surgical History Past Surgical History:  Procedure Laterality Date   CARDIAC CATHETERIZATION  1987   occluded marginal branch of RCA, treated medically - Dr Melvern Banker   CORONARY ANGIOPLASTY WITH STENT PLACEMENT  03/2003   2.5x72m Taxus DES to Cfx   ESOPHAGOGASTRODUODENOSCOPY N/A 03/14/2014   Procedure: ESOPHAGOGASTRODUODENOSCOPY (EGD) with Balloon Dilation;  Surgeon: MGarlan Fair MD;  Location: [facility] END[facility];  Service: Endoscopy;  Laterality: N/A;   EYE SURGERY Right    cataract surgery   GASTRECTOMY  50 yrs ago - 2 operations   had surgery at the age of 89for the ulcer   ILIAC ARTERY ANEURYSM REPAIR     Repaied by Dr. LKellie Simmeringapril 2009   JOINT REPLACEMENT Left    hip replacement   TRANSTHORACIC ECHOCARDIOGRAM  11/2009   EF=>55%, mild conc LVH, normal LV systolic function; mild MR; mild-mod TR with RV systolic pressure elevated at 30-434mg; mild-mod AV regurg; mild pulm valve regurg   Family History Family History  Problem Relation Age of Onset   Stroke Father    Heart disease Father    Hypertension Father    Heart disease Daughter        Before age 818 Diabetes Sister        DVT-Blood clots in vein    Social History Social History    Tobacco Use   Smoking status: Former    Years: 0.50    Types: Cigarettes    Quit date: 07/18/1992    Years since quitting: 29.8   Smokeless tobacco: Never  Tobacco comments:    quit around HA time  Vaping Use   Vaping Use: Never used  Substance Use Topics   Alcohol use: No   Drug use: No   Allergies Asa w/codeine [aspirin-codeine]  Review of Systems Review of Systems  Unable to perform ROS: Dementia    Physical Exam Vital Signs  I have reviewed the triage vital signs BP (!) 80/61   Pulse 100   Temp (!) 97.5 F (36.4 C) (Oral)   Resp (!) 22   SpO2 92%   Physical Exam Constitutional:      General: He is not in acute distress.    Appearance: Normal appearance.     Comments: Cachectic  HENT:     Head: Normocephalic and atraumatic.     Nose: No congestion or rhinorrhea.  Eyes:     General:        Right eye: No discharge.        Left eye: No discharge.     Extraocular Movements: Extraocular movements intact.     Pupils: Pupils are equal, round, and reactive to light.  Cardiovascular:     Rate and Rhythm: Normal rate and regular rhythm.     Heart sounds: No murmur heard. Pulmonary:     Effort: No respiratory distress.     Breath sounds: No wheezing or rales.  Abdominal:     General: There is no distension.     Tenderness: There is no abdominal tenderness.  Musculoskeletal:        General: Normal range of motion.     Cervical back: Normal range of motion.  Skin:    General: Skin is warm and dry.  Neurological:     General: No focal deficit present.     Mental Status: He is alert.     ED Results and Treatments Labs (all labs ordered are listed, but only abnormal results are displayed) Labs Reviewed  BASIC METABOLIC PANEL - Abnormal; Notable for the following components:      Result Value   CO2 17 (*)    Glucose, Bld 123 (*)    Calcium 8.5 (*)    All other components within normal limits  CBC - Abnormal; Notable for the following components:    RDW 16.6 (*)    Platelets 147 (*)    All other components within normal limits  URINALYSIS, ROUTINE W REFLEX MICROSCOPIC  HEPATIC FUNCTION PANEL  CBG MONITORING, ED                                                                                                                          Radiology No results found.  Pertinent labs & imaging results that were available during my care of the patient were reviewed by me and considered in my medical decision making (see MDM for details).  Medications Ordered in ED Medications  lactated ringers bolus 1,000 mL (has no administration in time range)  Procedures .Critical Care  Performed by: Teressa Lower, MD Authorized by: Teressa Lower, MD   Critical care provider statement:    Critical care time (minutes):  30   Critical care was necessary to treat or prevent imminent or life-threatening deterioration of the following conditions:  Hepatic failure and circulatory failure (pulmonary embolus)   Critical care was time spent personally by me on the following activities:  Development of treatment plan with patient or surrogate, discussions with consultants, evaluation of patient's response to treatment, examination of patient, ordering and review of laboratory studies, ordering and review of radiographic studies, ordering and performing treatments and interventions, pulse oximetry, re-evaluation of patient's condition and review of old charts   (including critical care time)  Medical Decision Making / ED Course   This patient presents to the ED for concern of failure to thrive, hypotension, this involves an extensive number of treatment options, and is a complaint that carries with it a high risk of complications and morbidity.  The differential diagnosis includes hypovolemia, chest mass, esophageal mass, abdominal  malignancy, liver failure, electrolyte abnormality  MDM: Patient seen emergency room for evaluation of multiple complaints as described above.  Physical exam reveals a cachectic appearing patient with abdominal fullness to palpation.  Laboratory evaluation with a CO2 of 17, albumin 2.2, AST 82, ALT 74, alk phos 440, total bili 11.9 with direct bili 6.9.  INR 1.5.  Patient's hypotension resolved with lactated Ringer's.  Chest x-ray with left lower lobe airspace disease and new bullae.  Follow-up CT imaging is extensive that shows a new PE in the distal right main pulmonary artery that extends into the lobar and segmental branches of the right lower lobe, right upper lobe nodule fibrotic changes at the lung bases, cardiomegaly and an aortic aneurysm measuring 4.1 cm, possible new thymic mass, interval development of marked intra and extrahepatic biliary ductal dilatation where the extrahepatic bile duct is obliterated as well as marked dilatation of the main pancreatic duct with multiple verrucoid cystic lesions, diffuse body wall wasting and anasarca.  I spoke with Dr. Lyndel Safe of GI who states that patient will likely need an MRCP to better characterize this as these abdominal findings are concerning.  Patient started on heparin drip for the pulmonary embolism.  Patient then admitted to the hospital service for further work-up of multiple comorbid conditions.   Additional history obtained: -Additional history obtained from multiple family members -External records from outside source obtained and reviewed including: Chart review including previous notes, labs, imaging, consultation notes   Lab Tests: -I ordered, reviewed, and interpreted labs.   The pertinent results include:   Labs Reviewed  BASIC METABOLIC PANEL - Abnormal; Notable for the following components:      Result Value   CO2 17 (*)    Glucose, Bld 123 (*)    Calcium 8.5 (*)    All other components within normal limits  CBC - Abnormal;  Notable for the following components:   RDW 16.6 (*)    Platelets 147 (*)    All other components within normal limits  URINALYSIS, ROUTINE W REFLEX MICROSCOPIC  HEPATIC FUNCTION PANEL  CBG MONITORING, ED      EKG   EKG Interpretation  Date/Time:  Thursday May 06 2022 13:41:57 EDT Ventricular Rate:  105 PR Interval:    QRS Duration: 98 QT Interval:  364 QTC Calculation: 481 R Axis:   -49 Text Interpretation: Atrial flutter with variable A-V block Cannot rule out Septal infarct , age  undetermined Abnormal ECG When compared with ECG of 01-Apr-2021 18:34, PREVIOUS ECG IS PRESENT Confirmed by Dalton Molesworth ([id number]) on 05/07/2022 12:02:54 AM         Imaging Studies ordered: I ordered imaging studies including CT PE, CT abdomen pelvis, chest x-ray I independently visualized and interpreted imaging. I agree with the radiologist interpretation   Medicines ordered and prescription drug management: Meds ordered this encounter  Medications   lactated ringers bolus 1,000 mL    -I have reviewed the patients home medicines and have made adjustments as needed  Critical interventions Heparin for PE, lactated Ringer's for hypotension, GI consultation for hepatic failure  Consultations Obtained: I requested consultation with the gastroenterologist Dr. Lyndel Safe,  and discussed lab and imaging findings as well as pertinent plan - they recommend: MRCP and admission   Cardiac Monitoring: The patient was maintained on a cardiac monitor.  I personally viewed and interpreted the cardiac monitored which showed an underlying rhythm of: A flutter  Social Determinants of Health:  Factors impacting patients care include: none   Reevaluation: After the interventions noted above, I reevaluated the patient and found that they have :improved  Co morbidities that complicate the patient evaluation  Past Medical History:  Diagnosis Date   Atrial fibrillation ([place])    CAD (coronary artery  disease)    DES to Cfx   Diabetes mellitus without complication ([place])    Fall at home 06/15/2018   History of nuclear stress test 02/2012   lexiscan; mild-mod perfusion defect in basal inferoseptal, mid inferoseptal apical anterior; no change from previous study, abnormal test    Hyperlipidemia    Hypertension    Myocardial infarction ([place]) 1980s   PAD (peripheral artery disease) ([place])    aortic stent graft 2008   PUD (peptic ulcer disease)    Small bowel obstruction ([place]) 10/2011   Ulcer       Dispostion: I considered admission for this patient, and for new PE, hepatic failure patient will require admission     Final Clinical Impression(s) / ED Diagnoses Final diagnoses:  None     @PCDICTATION @    Teressa Lower, MD 05/07/22 0004

## 2022-05-06 NOTE — ED Provider Triage Note (Signed)
Emergency Medicine Provider Triage Evaluation Note  Robert [name] , a 88 y.o. male  was evaluated in triage.  Pt complains of generalized weakness onset 1.5 weeks.  Patient was evaluated by his primary care provider today and told to come to the ED for further evaluation and likely admission to the hospital.  Patient denies chest pain or shortness of breath at this time.  Daughter provides majority of the history and notes the patient has had decreased appetite as of late.  Review of Systems  Positive: As per HPI Negative:   Physical Exam  BP (!) 80/61   Pulse 100   Temp (!) 97.5 F (36.4 C) (Oral)   Resp (!) 22   SpO2 92%  Gen:   Awake, no distress   Resp:  Normal effort no MSK:   Moves extremities without difficulty  Other:  Scleral icterus noted bilaterally.  Medical Decision Making  Medically screening exam initiated at 1:44 PM.  Appropriate orders placed.  Robert [name] was informed that the remainder of the evaluation will be completed by another provider, this initial triage assessment does not replace that evaluation, and the importance of remaining in the ED until their evaluation is complete.  2:06 PM - Discussed with RN that patient is in need of a room immediately. RN aware and working on room placement.  Work-up initiated.    Cheralyn Oliver A, PA-C 05/06/22 1409

## 2022-05-06 NOTE — Progress Notes (Signed)
ANTICOAGULATION CONSULT NOTE - Initial Consult  Pharmacy Consult for Heparin Indication: pulmonary embolus  Allergies  Allergen Reactions   Asa W/Codeine [Aspirin-Codeine] Other (See Comments)    Irritates his water system (urination burning)    Patient Measurements:   Heparin Dosing Weight: 51 kg  Vital Signs: Temp: 97.5 F (36.4 C) (07/13 1343) Temp Source: Oral (07/13 1343) BP: 130/74 (07/13 1915) Pulse Rate: 98 (07/13 1915)  Labs: Recent Labs    05/06/22 1339 05/06/22 1715  HGB 13.2  --   HCT 42.5  --   PLT 147*  --   CREATININE 1.06  --   TROPONINIHS  --  14    CrCl cannot be calculated (Unknown ideal weight.).   Medical History: Past Medical History:  Diagnosis Date   Atrial fibrillation ([place])    CAD (coronary artery disease)    DES to Cfx   Diabetes mellitus without complication ([place])    Fall at home 06/15/2018   History of nuclear stress test 02/2012   lexiscan; mild-mod perfusion defect in basal inferoseptal, mid inferoseptal apical anterior; no change from previous study, abnormal test    Hyperlipidemia    Hypertension    Myocardial infarction ([place]) 1980s   PAD (peripheral artery disease) ([place])    aortic stent graft 2008   PUD (peptic ulcer disease)    Small bowel obstruction ([place]) 10/2011   Ulcer     Medications:  (Not in a hospital admission)  Scheduled:  Infusions:   albumin human     lactated ringers     PRN:   Assessment: 50 yom with a history of A-fib, CAD status post MI with DES to the circumflex, T2DM, HTN, HLD, PAD . Patient is presenting with weakness. Heparin per pharmacy consult placed for pulmonary embolus.  CTA PE w/ PE in distal rt main pulmonary artery extending into lobar and segmental branches of RLL  Patient is not on anticoagulation prior to arrival.  Hgb 13.2; plt 147  Goal of Therapy:  Heparin level 0.3-0.7 units/ml Monitor platelets by anticoagulation protocol: Yes   Plan:  Give 4000 units bolus x 1 Start  heparin infusion at 900 units/hr Check anti-Xa level at 0300 and daily while on heparin Continue to monitor H&H and platelets  Lorelei Pont, PharmD, BCPS 05/06/2022 8:18 PM ED Clinical Pharmacist -  [phone]

## 2022-05-06 NOTE — H&P (Signed)
History and Physical    Patient: Robert [name] MRN: [id number] DOA: 05/06/2022  Date of Service: the patient was seen and examined on 05/07/2022  Patient coming from:  PCP office  Chief Complaint:  Chief Complaint  Patient presents with   Weakness    HPI:   88 year old male with past medical history of dementia, hypertension, hyperlipidemia, coronary artery disease (multiple remote MI's S/P cath in 1987 and cath in 2004 S/P DES to circumflex), diastolic congestive heart failure (Echo 08/2017 EF 55-60% with G1DD), non-insulin-dependent diabetes mellitus type 2, chronic atrial fibrillation and flutter (not on anticoagulation), diabetes mellitus type 2, abdominal aortic aneurysm who presents to [facility] emergency department from his primary care provider's office for evaluation of generalized weakness and hypotension.  Patient is an extremely poor historian due to lethargy and therefore the majority of the history has been obtained from discussion with the emergency department staff, review of triage notes and discussions with family.  According to family over the past 3 weeks patient has exhibited generalized progressive weakness.  This has been associated with increasing lethargy and poor oral intake.  Family also notes that patient has been throwing up food shortly after attempting to ingest with food seemingly getting stuck in his throat.  Finally, family is noting that patient has exhibited rapid unintentional weight loss over the past several weeks although it is unclear how much.  Patient has denied pain, has not been exhibiting any diarrhea, cough, shortness of breath.  Patient has not had any sick contacts, recent travel or contact with confirmed COVID-19 infection.  Due to progressively worsening symptoms patient went to see his primary care provider earlier in the day and due to severe weakness and hypotension the patient was sent to [facility] emergency department  for evaluation.  Upon evaluation in the emergency department patient was initially found to be hypotensive and was initiated on intravenous fluids.  CT of the chest abdomen and pelvis was obtained finding multiple abnormalities including pulmonary emboli in the distal right main pulmonary artery extending into the right lower and middle lobe without heart strain.  Patient additionally found to have groundglass opacities in the right middle lobe concerning for infection.  Additionally patient was identified to have marked intra and extrahepatic biliary ductal dilatation with obliteration of the extrahepatic bile duct as well as interval development of an marked dilatation of the main pancreatic duct.  ER provider discussed case with Dr. Lyndel Safe with gastroenterology who recommends obtaining an MRCP and tumor markers.  The hospitalist group has now been called to assess the patient for admission to the hospital.  Review of Systems: Review of Systems  Unable to perform ROS: Dementia     Past Medical History:  Diagnosis Date   Atrial fibrillation ([place])    CAD (coronary artery disease)    DES to Cfx   Diabetes mellitus without complication ([place])    Fall at home 06/15/2018   History of nuclear stress test 02/2012   lexiscan; mild-mod perfusion defect in basal inferoseptal, mid inferoseptal apical anterior; no change from previous study, abnormal test    Hyperlipidemia    Hypertension    Myocardial infarction ([place]) 1980s   PAD (peripheral artery disease) ([place])    aortic stent graft 2008   PUD (peptic ulcer disease)    Small bowel obstruction ([place]) 10/2011   Ulcer     Past Surgical History:  Procedure Laterality Date   CARDIAC CATHETERIZATION  1987   occluded marginal branch  of RCA, treated medically - Dr Melvern Banker   CORONARY ANGIOPLASTY WITH STENT PLACEMENT  03/2003   2.5x19m Taxus DES to Cfx   ESOPHAGOGASTRODUODENOSCOPY N/A 03/14/2014   Procedure: ESOPHAGOGASTRODUODENOSCOPY (EGD) with Balloon  Dilation;  Surgeon: MGarlan Fair MD;  Location: [facility] ENDOSCOPY;  Service: Endoscopy;  Laterality: N/A;   EYE SURGERY Right    cataract surgery   GASTRECTOMY  50 yrs ago - 2 operations   had surgery at the age of 30for the ulcer   ILIAC ARTERY ANEURYSM REPAIR     Repaied by Dr. LKellie Simmeringapril 2009   JOINT REPLACEMENT Left    hip replacement   TRANSTHORACIC ECHOCARDIOGRAM  11/2009   EF=>55%, mild conc LVH, normal LV systolic function; mild MR; mild-mod TR with RV systolic pressure elevated at 30-450mg; mild-mod AV regurg; mild pulm valve regurg    Social History:  reports that he quit smoking about 29 years ago. His smoking use included cigarettes. He has never used smokeless tobacco. He reports that he does not drink alcohol and does not use drugs.  Allergies  Allergen Reactions   Asa W/Codeine [Aspirin-Codeine] Other (See Comments)    Irritates his water system (urination burning)    Family History  Problem Relation Age of Onset   Stroke Father    Heart disease Father    Hypertension Father    Heart disease Daughter        Before age 86 Diabetes Sister        DVT-Blood clots in vein    Prior to Admission medications   Medication Sig Start Date End Date Taking? Authorizing Provider  atorvastatin (LIPITOR) 10 MG tablet Take 1 tablet by mouth daily.   Yes [provider]  Ferrous Sulfate (IRON) 325 (65 Fe) MG TABS Take 1 tablet by mouth daily. 12/31/20  Yes [provider]  furosemide (LASIX) 40 MG tablet TAKE 1 TABLET(40 MG) BY MOUTH DAILY Patient taking differently: Take 40 mg by mouth daily. 10/20/21  Yes Hilty, KeNadean CorwinMD  lisinopril (ZESTRIL) 20 MG tablet TAKE 1 TABLET(20 MG) BY MOUTH DAILY 07/09/21  Yes Hilty, KeNadean CorwinMD  metoprolol tartrate (LOPRESSOR) 25 MG tablet Take 25 mg by mouth 2 (two) times daily. 12/25/20  Yes [provider]  potassium chloride (KLOR-CON) 10 MEQ tablet Take 1 tablet by mouth daily.   Yes [provider]   tamsulosin (FLOMAX) 0.4 MG CAPS capsule Take 1 capsule by mouth daily. 04/04/19  Yes [provider]  clopidogrel (PLAVIX) 75 MG tablet TAKE 1 TABLET(75 MG) BY MOUTH DAILY Patient not taking: Reported on 05/06/2022 06/08/21   HiPixie CasinoMD    Physical Exam:  Vitals:   05/07/22 0045 05/07/22 0245 05/07/22 0300 05/07/22 0315  BP: 113/70 118/83 100/66 109/67  Pulse: (!) 59 83 62 (!) 56  Resp: '16 14 10 '$ (!) 8  Temp:      TempSrc:      SpO2: 95% 90% 95% 96%    Constitutional: Lethargic but arousable, oriented x1 no associated distress.   Skin: no rashes, no lesions, good skin turgor noted. Eyes: Notable discharge from both eyes.  Notable injected conjunctive a bilaterally.  Pupils are equally reactive to light.   ENMT: Mucous membranes noted.  Posterior pharynx clear of any exudate or lesions.   Neck: normal, supple, no masses, no thyromegaly.  No evidence of jugular venous distension.   Respiratory: clear to auscultation bilaterally, no wheezing, no crackles. Normal respiratory effort. No  accessory muscle use.  Cardiovascular: Regular rate and rhythm, no murmurs / rubs / gallops.  Trace distal bilateral lower extremity pitting edema.  2+ pedal pulses. No carotid bruits.  Chest:   Nontender without crepitus or deformity.   Back:   Nontender without crepitus or deformity. Abdomen: Notable significant epigastric abdominal fullness on exam.  No associated tenderness.  Abdomen is soft.   Positive bowel sounds noted in all quadrants.   Musculoskeletal: No joint deformity upper and lower extremities. Good ROM, no contractures.  Notable poor muscle tone Neurologic:   Patient moving all 4 extremities spontaneously.  Patient is not consistently following commands.  Patient is lethargic but arousable and oriented x1..  Patient is responsive to verbal stimuli.   Psychiatric: Unable to assess due to significant lethargy.  Patient currently does not seem to possess insight as to his current  situation..    Data Reviewed:  I have personally reviewed and interpreted labs, imaging.  Significant findings are:  Chemistry revealing sodium 138, potassium 3.6, bicarbonate 17, glucose 123, BUN 17, creatinine 1.06. CBC revealing white blood cell count 5.4, hemoglobin 13.2, hematocrit 42.5, platelet count 147. Coagulation profile revealing INR 1.5, PT of 17.7 Hepatic function panel revealing albumin 2.2, AST 82, ALT 74, alkaline phosphatase 440, total bilirubin 11.9 direct bilirubin of 6.9 and indirect bilirubin of 5.0. CT angiogram of the chest revealing pulmonary emboli in the distal right main pulmonary artery extending to the lobar and segmental branches of right lower lobe and right middle lobe without evidence of right heart strain.  Also evidence of patchy groundglass opacities in the right middle lobe likely infectious in etiology.  Evidence of severe emphysema with large bulla in both lower lobes.  Focal opacity in the right upper lobe measuring 1.5 cm in diameter.  Aneurysmal dilatation of the aorta measuring 4.1 cm. CT imaging of the abdomen pelvis with contrast revealing marked intra and extrahepatic biliary ductal dilatation to the level of the ampulla where extrahepatic ductal bile duct is obliterated.  Multiple cystic lesions in the region of the pancreatic head.  Discrete mass could not be clearly identified.  EKG: Personally reviewed.  Rhythm is atrial flutter with variable AV block with heart rate of 105 bpm.  No dynamic ST segment changes appreciated.   Assessment and Plan: * Pancreatic mass Patient presenting with several weeks of unintentional weight loss, abdominal discomfort, dark urine, lethargy and weakness Numerous findings via radiographic work-up with MRCP suggestive of a pancreatic mass with significant biliary obstruction.  Patient will likely require EUS or ERCP if family wishes to proceed Formal consultation request sent to Dr. Watt Climes with [facility] gastroenterology  (PCP is [facility]) Patient will be kept n.p.o. for now Prognosis is likely quite poor Tumor markers have been ordered including CA 19-9, AFP and CEA levels  Acute pulmonary embolism (HCC) Multiple pulmonary emboli noted on CT angiogram of the chest likely secondary to hypercoagulable state from malignant process No evidence of right heart strain on CT Currently on heparin infusion although considering patient's liver disease patient is at high risk of bleeding complications Monitoring on telemetry Obtaining echocardiogram  Pneumonia of right middle lobe due to infectious organism Appearance of right middle lobe infectious process on CT imaging Placing patient on intravenous antibiotics with ceftriaxone and azithromycin Blood cultures ordered Supplemental oxygen as necessary As needed breathing treatments  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  Nodule of upper lobe of right lung Unclear as to whether this is related to an underlying malignant process or a secondary malignancy altogether Conservative management for now pertaining to this nodule as other foci concerning for malignancy are addressed  Coronary artery disease involving native coronary artery of native heart without angina pectoris Patient is currently chest pain free Monitoring patient on telemetry Holding home regimen of clopidogrel Holding statin therapy due to severely deranged liver function Continue home regimen of metoprolol   Type 2 diabetes mellitus without complication, without long-term current use of insulin (HCC) Patient been placed on Accu-Cheks before every meal and nightly with sliding scale insulin Hemoglobin  A1C ordered Diabetic Diet once n.p.o. is discontinued   Chronic diastolic CHF (congestive heart failure) (HCC) No clinical evidence of cardiogenic volume overload Strict input and output monitoring Daily weights Low-sodium diet once n.p.o. is discontinued   Essential hypertension Continue home regimen metoprolol As needed intravenous antihypertensives for markedly elevated blood pressure  Bacterial conjunctivitis of both eyes Initiating Polytrim eyedrops for 7 days  Mixed hyperlipidemia due to type 2 diabetes mellitus (HCC) Holding statin therapy due to deranged liver function  BPH (benign prostatic hyperplasia) Continue home regimen of Flomax       Code Status:  DNR  code status decision has been confirmed with: daugther Family Communication: Daughter is at the bedside and has been updated on plan of care  Consults: Dr. Watt Climes with [facility]  Severity of Illness:  The appropriate patient status for this patient is INPATIENT. Inpatient status is judged to be reasonable and necessary in order to provide the required intensity of service to ensure the patient's safety. The patient's presenting symptoms, physical exam findings, and initial radiographic and laboratory data in the context of their chronic comorbidities is felt to place them at high risk for further clinical deterioration. Furthermore, it is not anticipated that the patient will be medically stable for discharge from the hospital within 2 midnights of admission.   * I certify that at the point of admission it is my clinical judgment that the patient will require inpatient hospital care spanning beyond 2 midnights from the point of admission due to high intensity of service, high risk for further deterioration and high frequency of surveillance required.*  Author:  Vernelle Emerald MD  05/07/2022 6:27 AM

## 2022-05-06 NOTE — ED Notes (Signed)
Patient transported to X-ray 

## 2022-05-07 ENCOUNTER — Inpatient Hospital Stay (HOSPITAL_COMMUNITY): Payer: PPO

## 2022-05-07 DIAGNOSIS — I5032 Chronic diastolic (congestive) heart failure: Secondary | ICD-10-CM

## 2022-05-07 DIAGNOSIS — I2699 Other pulmonary embolism without acute cor pulmonale: Secondary | ICD-10-CM

## 2022-05-07 DIAGNOSIS — K8689 Other specified diseases of pancreas: Secondary | ICD-10-CM | POA: Diagnosis not present

## 2022-05-07 DIAGNOSIS — B9689 Other specified bacterial agents as the cause of diseases classified elsewhere: Secondary | ICD-10-CM | POA: Diagnosis present

## 2022-05-07 LAB — URINALYSIS, ROUTINE W REFLEX MICROSCOPIC
Glucose, UA: NEGATIVE mg/dL
Hgb urine dipstick: NEGATIVE
Ketones, ur: NEGATIVE mg/dL
Leukocytes,Ua: NEGATIVE
Nitrite: NEGATIVE
Protein, ur: 30 mg/dL — AB
Specific Gravity, Urine: >1.046 — ABNORMAL HIGH (ref 1.005–1.030)
pH: 5 (ref 5.0–8.0)

## 2022-05-07 LAB — COMPREHENSIVE METABOLIC PANEL
ALT: 64 U/L — ABNORMAL HIGH (ref 0–44)
AST: 67 U/L — ABNORMAL HIGH (ref 15–41)
Albumin: 2.2 g/dL — ABNORMAL LOW (ref 3.5–5.0)
Alkaline Phosphatase: 383 U/L — ABNORMAL HIGH (ref 38–126)
Anion gap: 9 (ref 5–15)
BUN: 16 mg/dL (ref 8–23)
CO2: 23 mmol/L (ref 22–32)
Calcium: 8.2 mg/dL — ABNORMAL LOW (ref 8.9–10.3)
Chloride: 105 mmol/L (ref 98–111)
Creatinine, Ser: 1.02 mg/dL (ref 0.61–1.24)
GFR, Estimated: >60 mL/min (ref >60)
Glucose, Bld: 99 mg/dL (ref 70–99)
Potassium: 3.1 mmol/L — ABNORMAL LOW (ref 3.5–5.1)
Sodium: 137 mmol/L (ref 135–145)
Total Bilirubin: 10.8 mg/dL — ABNORMAL HIGH (ref 0.3–1.2)
Total Protein: 6 g/dL — ABNORMAL LOW (ref 6.5–8.1)

## 2022-05-07 LAB — GLUCOSE, CAPILLARY
Glucose-Capillary: 65 mg/dL — ABNORMAL LOW (ref 70–99)
Glucose-Capillary: 83 mg/dL (ref 70–99)

## 2022-05-07 LAB — HEMOGLOBIN A1C
Hgb A1c MFr Bld: 7.4 % — ABNORMAL HIGH (ref 4.8–5.6)
Mean Plasma Glucose: 165.68 mg/dL

## 2022-05-07 LAB — MAGNESIUM: Magnesium: 2.3 mg/dL (ref 1.7–2.4)

## 2022-05-07 LAB — HEPARIN LEVEL (UNFRACTIONATED)
Heparin Unfractionated: <0.1 IU/mL — ABNORMAL LOW (ref 0.30–0.70)
Heparin Unfractionated: 0.19 IU/mL — ABNORMAL LOW (ref 0.30–0.70)

## 2022-05-07 LAB — APTT: aPTT: 53 seconds — ABNORMAL HIGH (ref 24–36)

## 2022-05-07 LAB — AMMONIA: Ammonia: 23 umol/L (ref 9–35)

## 2022-05-07 LAB — ECHOCARDIOGRAM COMPLETE

## 2022-05-07 LAB — PROTIME-INR
INR: 1.5 — ABNORMAL HIGH (ref 0.8–1.2)
Prothrombin Time: 17.5 seconds — ABNORMAL HIGH (ref 11.4–15.2)

## 2022-05-07 MED ORDER — GADOBUTROL 1 MMOL/ML IV SOLN
5.0000 mL | Freq: Once | INTRAVENOUS | Status: AC | PRN
  Administered 2022-05-07: 5 mL via INTRAVENOUS

## 2022-05-07 MED ORDER — HEPARIN BOLUS VIA INFUSION
2000.0000 [IU] | Freq: Once | INTRAVENOUS | Status: AC
  Administered 2022-05-07: 2000 [IU] via INTRAVENOUS
  Filled 2022-05-07: qty 2000

## 2022-05-07 MED ORDER — DEXTROSE 50 % IV SOLN: 12.5000 g | INTRAVENOUS | Status: AC

## 2022-05-07 MED ORDER — ALBUTEROL SULFATE (2.5 MG/3ML) 0.083% IN NEBU
2.5000 mg | INHALATION_SOLUTION | RESPIRATORY_TRACT | Status: DC | PRN
Start: 2022-05-07 — End: 2022-05-13

## 2022-05-07 MED ORDER — SODIUM CHLORIDE 0.9 % IV SOLN
500.0000 mg | INTRAVENOUS | Status: AC
  Administered 2022-05-07 – 2022-05-11 (×5): 500 mg via INTRAVENOUS
  Filled 2022-05-07 (×6): qty 5

## 2022-05-07 MED ORDER — DEXTROSE 50 % IV SOLN
12.5000 g | INTRAVENOUS | Status: AC
  Administered 2022-05-07: 12.5 g via INTRAVENOUS
  Filled 2022-05-07: qty 50

## 2022-05-07 MED ORDER — SODIUM CHLORIDE 0.9 % IV SOLN
2.0000 g | INTRAVENOUS | Status: AC
  Administered 2022-05-07 – 2022-05-11 (×5): 2 g via INTRAVENOUS
  Filled 2022-05-07 (×5): qty 20

## 2022-05-07 MED ORDER — HEPARIN BOLUS VIA INFUSION
1500.0000 [IU] | Freq: Once | INTRAVENOUS | Status: DC
Start: 2022-05-07 — End: 2022-05-07
  Filled 2022-05-07: qty 1500

## 2022-05-07 MED ORDER — POTASSIUM CHLORIDE 10 MEQ/100ML IV SOLN
10.0000 meq | INTRAVENOUS | Status: AC
  Administered 2022-05-08 (×5): 10 meq via INTRAVENOUS
  Filled 2022-05-07 (×6): qty 100

## 2022-05-07 MED ORDER — POLYMYXIN B-TRIMETHOPRIM 10000-0.1 UNIT/ML-% OP SOLN
1.0000 [drp] | Freq: Four times a day (QID) | OPHTHALMIC | Status: DC
  Administered 2022-05-07 – 2022-05-13 (×21): 1 [drp] via OPHTHALMIC
  Filled 2022-05-07 (×2): qty 10

## 2022-05-07 MED ORDER — POTASSIUM CHLORIDE CRYS ER 20 MEQ PO TBCR: 40.0000 meq | EXTENDED_RELEASE_TABLET | ORAL | Status: DC

## 2022-05-07 MED ORDER — DEXTROSE-NACL 5-0.9 % IV SOLN
INTRAVENOUS | Status: DC
Start: 2022-05-07 — End: 2022-05-11

## 2022-05-07 MED ORDER — HYDRALAZINE HCL 20 MG/ML IJ SOLN: 10.0000 mg | Freq: Four times a day (QID) | INTRAMUSCULAR | Status: DC | PRN

## 2022-05-07 NOTE — ED Notes (Signed)
Lab called, pt's blood hemolyzed for heparin

## 2022-05-07 NOTE — Assessment & Plan Note (Addendum)
   Unclear as to whether this is related to an underlying malignant process or a secondary malignancy altogether  Conservative management for now pertaining to this nodule as other foci concerning for malignancy are addressed

## 2022-05-07 NOTE — Assessment & Plan Note (Signed)
   Notable hyperplasia and possible soft tissue nodule of the thymus  Unclear etiology, possibly related to metastatic process although considering patient's presentation best approach would be to address the a forementioned pancreatic mass/hepatobiliary disease for now and manage the thymus hyperplasia conservatively.

## 2022-05-07 NOTE — ED Notes (Signed)
This tech alone with nurse removed pt clothing. Pt started to urinate on self and bed while being changed into hospital gown. Pt cleaned up, provided fresh clean linen. Pt reposition in bed, placed on condom cathter and provided warm blanket. Daughter at bedside.

## 2022-05-07 NOTE — Assessment & Plan Note (Signed)
   Patient presenting with several weeks of unintentional weight loss, abdominal discomfort, dark urine, lethargy and weakness  Numerous findings via radiographic work-up with MRCP suggestive of a pancreatic mass with significant biliary obstruction.  Patient will likely require EUS or ERCP if family wishes to proceed  Formal consultation request sent to Dr. Watt Climes with [facility] gastroenterology (PCP is [facility])  Patient will be kept n.p.o. for now  Prognosis is likely quite poor  Tumor markers have been ordered including CA 19-9, AFP and CEA levels

## 2022-05-07 NOTE — Assessment & Plan Note (Signed)
.   Patient been placed on Accu-Cheks before every meal and nightly with sliding scale insulin . Hemoglobin A1C ordered . Diabetic Diet once n.p.o. is discontinued

## 2022-05-07 NOTE — Assessment & Plan Note (Signed)
   Multiple pulmonary emboli noted on CT angiogram of the chest likely secondary to hypercoagulable state from malignant process  No evidence of right heart strain on CT  Currently on heparin infusion although considering patient's liver disease patient is at high risk of bleeding complications  Monitoring on telemetry  Obtaining echocardiogram

## 2022-05-07 NOTE — ED Notes (Addendum)
Phlebotomy  made aware of difficult stick

## 2022-05-07 NOTE — Assessment & Plan Note (Signed)
   Continue home regimen of Flomax 

## 2022-05-07 NOTE — Progress Notes (Signed)
Daughter at bedside reports that pt had a choking episode on Wednesday.  Discussed with NP Kyere and will keep pt NPO and add D5NS at 48m/hr. SAyesha MohairBSN RN CWarrick7/14/2023, 11:20 PM

## 2022-05-07 NOTE — Progress Notes (Signed)
PROGRESS NOTE    Robert [name]  IRC:[id number] DOB: 26-Jan-1932 DOA: 05/06/2022 PCP: Lavone Orn, MD     Brief Narrative:  88 year old male with past medical history of dementia, hypertension, hyperlipidemia, coronary artery disease (multiple remote MI's S/P cath in 1987 and cath in 2004 S/P DES to circumflex), diastolic congestive heart failure (Echo 08/2017 EF 55-60% with G1DD), non-insulin-dependent diabetes mellitus type 2, chronic atrial fibrillation and flutter (not on anticoagulation), diabetes mellitus type 2, abdominal aortic aneurysm who presents to [facility] emergency department from his primary care provider's office for evaluation of generalized weakness and hypotension. Also rapid unintentional weight loss.   CT of the chest abdomen and pelvis was obtained finding multiple abnormalities including pulmonary emboli in the distal right main pulmonary artery extending into the right lower and middle lobe without heart strain.  Patient additionally found to have groundglass opacities in the right middle lobe concerning for infection.  Additionally patient was identified to have marked intra and extrahepatic biliary ductal dilatation with obliteration of the extrahepatic bile duct as well as interval development of an marked dilatation of the main pancreatic duct. MRCP showed dilation and pancreatic resulting in obstruction of the distal common bile duct and pancreatic ducts with severe proximal ductal dilation.  Small peripancreatic lymph nodes suspicious for potential nodal metastasis.  Biliary sludge. Normal LFTs with PCP 12/2021. GI consulted.   New events last 24 hours / Subjective: Patient seen in the emergency department with daughter at bedside.  He is somnolent on examination.  Daughter states that patient is hard of hearing at baseline.  Assessment & Plan:  Principal Problem:   Pancreatic mass Active Problems:   Acute pulmonary embolism (HCC)   Pneumonia of right  middle lobe due to infectious organism   Thymus hyperplasia (HCC)   Nodule of upper lobe of right lung   Coronary artery disease involving native coronary artery of native heart without angina pectoris   Type 2 diabetes mellitus without complication, without long-term current use of insulin (HCC)   Chronic diastolic CHF (congestive heart failure) (HCC)   Essential hypertension   Mixed hyperlipidemia due to type 2 diabetes mellitus (HCC)   Bacterial conjunctivitis of both eyes   BPH (benign prostatic hyperplasia)   Pancreatic mass, with biliary duct dilation -GI consulted -Question ERCP/EUS timing -Consulted palliative care to involve them in goals of care setting  Acute PE -IV heparin -Echo pending   Community-acquired pneumonia -Rocephin, azithromycin  Thymus hyperplasia  -Notable hyperplasia and possible soft tissue nodule of the thymus  Nodule of upper lobe of right lung -Unclear if related to underlying malignant process/mets vs second primary malignancy  -Hx of smoking, quit when he was 88 yo   Failure to thrive, poor oral intake -Likely secondary to pancreatic mass as above -Await timing of ERCP, consult dietitian for malnutrition  CAD -Plavix on hold due to pending procedure  -Lipitor on hold due to elevated LFT  DM type 2 -A1c 7.4 -SSI  Chronic diastolic HF -IVF due to NPO and FTT -Monitor fluid status  HTN -Metoprolol   Bacterial conjunctivitis of both eyes -Polytrim eyedrops for 7 days  BPH -Flomax   Hypokalemia -Replace   DVT prophylaxis:  SCDs Start: 05/06/22 2301  Code Status: DNR Family Communication: Daughter at bedside.  Patient is widowed.  Has 5 children. Disposition Plan:  Status is: Inpatient Remains inpatient appropriate because: Pending further work up  Consultants:  GI PMT   Antimicrobials:  Anti-infectives (From admission, onward)  Start     Dose/Rate Route Frequency Ordered Stop   05/07/22 0630  cefTRIAXone  (ROCEPHIN) 2 g in sodium chloride 0.9 % 100 mL IVPB        2 g 200 mL/hr over 30 Minutes Intravenous Every 24 hours 05/07/22 0621     05/07/22 0630  azithromycin (ZITHROMAX) 500 mg in sodium chloride 0.9 % 250 mL IVPB        500 mg 250 mL/hr over 60 Minutes Intravenous Every 24 hours 05/07/22 0621          Objective: Vitals:   05/07/22 0645 05/07/22 0900 05/07/22 1000 05/07/22 1300  BP: 119/68 114/69 110/84 115/76  Pulse: 73   91  Resp: '12 15 15 15  '$ Temp:      TempSrc:      SpO2: 96%   95%   No intake or output data in the 24 hours ending 05/07/22 1347 There were no vitals filed for this visit.  Examination:  General exam: Appears calm and comfortable, somnolent   Respiratory system: Clear to auscultation. Respiratory effort normal. No respiratory distress. Cardiovascular system: S1 & S2 heard, irreg rhythm, flutter waves on telemetry, rate 70s  Gastrointestinal system: Abdomen is nondistended, soft  Extremities: Symmetric in appearance  Skin: No rashes, lesions or ulcers on exposed skin   Data Reviewed: I have personally reviewed following labs and imaging studies  CBC: Recent Labs  Lab 05/06/22 1339 05/07/22 0519  WBC 5.4 4.8  NEUTROABS  --  4.1  HGB 13.2 12.0*  HCT 42.5 36.2*  MCV 98.6 92.1  PLT 147* 782*   Basic Metabolic Panel: Recent Labs  Lab 05/06/22 1339 05/07/22 0519  NA 138 137  K 3.6 3.1*  CL 106 105  CO2 17* 23  GLUCOSE 123* 99  BUN 17 16  CREATININE 1.06 1.02  CALCIUM 8.5* 8.2*  MG  --  2.3   GFR: CrCl cannot be calculated (Unknown ideal weight.). Liver Function Tests: Recent Labs  Lab 05/06/22 1715 05/07/22 0519  AST 82* 67*  ALT 74* 64*  ALKPHOS 440* 383*  BILITOT 11.9* 10.8*  PROT 6.5 6.0*  ALBUMIN 2.2* 2.2*   No results for input(s): "LIPASE", "AMYLASE" in the last 168 hours. Recent Labs  Lab 05/07/22 0519  AMMONIA 23   Coagulation Profile: Recent Labs  Lab 05/06/22 2005 05/07/22 0519  INR 1.5* 1.5*   Cardiac  Enzymes: No results for input(s): "CKTOTAL", "CKMB", "CKMBINDEX", "TROPONINI" in the last 168 hours. BNP (last 3 results) No results for input(s): "PROBNP" in the last 8760 hours. HbA1C: Recent Labs    05/07/22 0519  HGBA1C 7.4*   CBG: No results for input(s): "GLUCAP" in the last 168 hours. Lipid Profile: No results for input(s): "CHOL", "HDL", "LDLCALC", "TRIG", "CHOLHDL", "LDLDIRECT" in the last 72 hours. Thyroid Function Tests: No results for input(s): "TSH", "T4TOTAL", "FREET4", "T3FREE", "THYROIDAB" in the last 72 hours. Anemia Panel: No results for input(s): "VITAMINB12", "FOLATE", "FERRITIN", "TIBC", "IRON", "RETICCTPCT" in the last 72 hours. Sepsis Labs: No results for input(s): "PROCALCITON", "LATICACIDVEN" in the last 168 hours.  No results found for this or any previous visit (from the past 240 hour(s)).    Radiology Studies: MR ABDOMEN MRCP W WO CONTAST  Result Date: 05/07/2022 CLINICAL DATA:  88 year old male with history of jaundice. EXAM: MRI ABDOMEN WITHOUT AND WITH CONTRAST (INCLUDING MRCP) TECHNIQUE: Multiplanar multisequence MR imaging of the abdomen was performed both before and after the administration of intravenous contrast. Heavily T2-weighted images of the  biliary and pancreatic ducts were obtained, and three-dimensional MRCP images were rendered by post processing. CONTRAST:  25m GADAVIST GADOBUTROL 1 MMOL/ML IV SOLN COMPARISON:  No prior abdominal MRI. CT the abdomen and pelvis 05/06/2022. FINDINGS: Comment: Today's study is exceedingly limited by extensive patient respiratory motion and suboptimal contrast bolus (the majority of the postcontrast images demonstrate no intravascular gadolinium). Lower chest: Increased susceptibility artifact in the dependent portions of the lower thorax bilaterally corresponding to extensive bullous disease in this region on recent chest CT. Small right pleural effusion. Hepatobiliary: Severely limited assessment secondary to  suboptimal contrast bolus and patient respiratory motion. With these limitations in mind, there is severe intrahepatic biliary ductal dilatation and diffuse periportal edema. Several well-defined T1 hypointense, T2 hyperintense, nonenhancing lesions scattered throughout the liver correspond to multiple cysts noted on prior CT examinations, measuring up to 3.3 x 2.8 cm in segment 8 (axial image 6 of series 4). No clearly aggressive intrahepatic lesion confidently identified within the limitations of today's examination. Gallbladder is severely distended. In the dependent portion of the gallbladder there is some T1 hyperintense, T2 hypointense amorphous material lying dependently, most likely to represent biliary sludge. Gallbladder wall thickness is upper limits of normal at 3 mm. Trace amount of pericholecystic fluid, nonspecific in the setting of trace volume of ascites. Cystic duct is dilated. Shortly after the confluence of the common hepatic duct and the cystic duct there is some focal narrowing of the proximal common bile duct, which appears to demonstrate some peripheral enhancement best appreciated on axial image 47 of series 19. Slightly more distal to this best appreciated on coronal MRCP image 29 of series 11 there is irregularity in and around the common bile duct, resulting in complete obliteration of the lumen. Whether this is secondary to extrinsic or intrinsic obstruction is uncertain on today's limited study. Pancreas: Severe dilatation of the main pancreatic duct and many pancreatic ductal side branches, with the main pancreatic duct measuring up to 1.8 cm in the neck of the pancreas. Multiple cystic appearing regions in the head of the pancreas appear to predominantly represent dilated side branches, however, the pancreatic head appears enlarged relative to the rest of the pancreas which is otherwise severely atrophic, and in the region of the common bile duct obliteration in the pancreatic head  (axial image 57 of series 19) this mass-like appearance has multiple internal cystic regions and relatively increased parenchymal enhancement, estimated to measure approximately 5.3 x 4.9 cm. Whether there is truly a lesion in this region or not is uncertain on today's limited study, however, the coincident obliteration of the common bile duct and main pancreatic duct in these regions, with severe proximal dilatation raises concern for neoplasm. Spleen:  Unremarkable. Adrenals/Urinary Tract: Multiple T1 hypointense, T2 hyperintense, nonenhancing lesions in both kidneys, compatible with simple cysts, largest of which is exophytic in the lateral aspect of the interpolar region of the left kidney measuring up to 7.3 cm in diameter. No hydroureteronephrosis in the visualized portions of the abdomen. Bilateral adrenal glands are normal in appearance. Stomach/Bowel: Visualized portions are unremarkable. Vascular/Lymphatic: Aorto bi-iliac stent graft noted, apparently patent on today's limited examination. Multiple prominent borderline enlarged peripancreatic lymph nodes adjacent to the pancreatic head, measuring up to 9 mm (axial image 54 of series 19). Other:  Trace volume of ascites. Musculoskeletal: No aggressive appearing osseous lesions are noted in the visualized portions of the skeleton. IMPRESSION: 1. Severely limited examination which is highly suspicious for a complex cystic and solid lesion  in the pancreatic head resulting in obstruction of the distal common bile duct and pancreatic ducts, with severe proximal ductal dilatation. Small peripancreatic lymph nodes suspicious for potential nodal metastasis. Further evaluation with endoscopic ultrasound and/or ERCP is recommended to better evaluate these findings. 2. Biliary sludge lying dependently in the gallbladder. Gallbladder is severely distended. Gallbladder wall thickness appears upper limits of normal, and there is a trace volume of pericholecystic fluid  (assessment is limited by presence of small volume of ascites) such that the possibility of early acute cholecystitis is not excluded. 3. Severe bullous emphysema in the lung bases. Clinical correlation for evidence of alpha-1 antitrypsin deficiency is recommended. 4. Additional incidental findings, as above. Electronically Signed   By: Vinnie Langton M.D.   On: 05/07/2022 05:48   CT ABDOMEN PELVIS W CONTRAST  Result Date: 05/06/2022 CLINICAL DATA:  Pulmonary nodule, metastatic disease EXAM: CT ABDOMEN AND PELVIS WITH CONTRAST TECHNIQUE: Multidetector CT imaging of the abdomen and pelvis was performed using the standard protocol following bolus administration of intravenous contrast. RADIATION DOSE REDUCTION: This exam was performed according to the departmental dose-optimization program which includes automated exposure control, adjustment of the mA and/or kV according to patient size and/or use of iterative reconstruction technique. CONTRAST:  19m OMNIPAQUE IOHEXOL 300 MG/ML  SOLN COMPARISON:  04/01/2021 FINDINGS: Lower chest: Prominent bulla are noted within the lung bases bilaterally better assessed on previously performed chest CT at 6:20 p.m. Patchy ground-glass pulmonary infiltrates are noted within the visualized lung bases, possibly infectious or inflammatory in nature. Extensive multi-vessel coronary artery calcification. Cardiac size is within normal limits. Hepatobiliary: There is interval development of marked intra and extrahepatic biliary ductal dilation to the level of the ampulla where the extrahepatic bile duct is obliterated, best seen on image # 37/3 and 63/5. The gallbladder is markedly distended in keeping with an obstructive biliary process. Multiple hepatic cysts are again identified. No enhancing intrahepatic mass identified. Pancreas: The main pancreatic duct is markedly dilated, new since prior examination. The duct also appears occluded in the region of the ampulla, best seen on  axial image # 39/3 and 61/5. Multiple varicoid cystic lesions in this region of the pancreatic head may represent dilated pancreatic side branches. A discrete mass is not clearly identified on this limited examination, however. Spleen: Unremarkable Adrenals/Urinary Tract: The adrenal glands are unremarkable. The kidneys are normal in size and position. Multiple simple cortical cysts are seen within the kidneys bilaterally. No further follow-up is recommended for these lesions there is no hydronephrosis. Contrast from prior chest CT examination is seen within the renal collecting system. The bladder is unremarkable. Stomach/Bowel: Evaluation of the bowel is markedly limited by lack of intra-abdominal fat, diffuse anasarca, and phase of contrast enhancement. The large and small bowel are grossly unremarkable. No evidence of obstruction. No free intraperitoneal gas or fluid. Vascular/Lymphatic: Abdominal aortic aneurysm pair utilizing a bifurcated stent graft is again identified. No residual abdominal aortic aneurysm. Extensive aortoiliac atherosclerotic calcification. Evaluation of the retroperitoneal soft tissues is markedly limited by extensive retroperitoneal edema though no definite pathologic adenopathy is identified. Reproductive: The prostate gland is unremarkable. Other: There is diffuse body wall wasting and edema in keeping with anasarca. Musculoskeletal: No acute bone abnormality. Degenerative changes are seen within the lumbar spine. Left total hip arthroplasty has been performed. IMPRESSION: 1. Interval development of marked intra and extrahepatic biliary ductal dilation to the level of the ampulla where the extrahepatic bile duct is obliterated. Interval development of marked dilation of  the main pancreatic duct. Multiple varicoid cystic lesions in the region of the pancreatic head may represent dilated pancreatic side branches. A discrete mass is not clearly identified on this limited examination.  Correlation with EUS/ERCP or MRI/MRCP examination may be helpful for further evaluation. 2. Patchy ground-glass pulmonary infiltrates within the visualized lung bases, possibly infectious or inflammatory in nature. 3. Diffuse body wall wasting and edema in keeping with anasarca. Extensive body wall and retroperitoneal edema limits evaluation of the a intraperitoneal and retroperitoneal structures. 4. Extensive multi-vessel coronary artery calcification. 5. Abdominal aortic aneurysm utilizing a bifurcated stent graft. No residual abdominal aortic aneurysm. Electronically Signed   By: Fidela Salisbury M.D.   On: 05/06/2022 21:09   CT Angio Chest PE W and/or Wo Contrast  Addendum Date: 05/06/2022   ADDENDUM REPORT: 05/06/2022 19:22 ADDENDUM: Additional IMPRESSION: 8. Prominent soft tissue in the region of the thymus. Findings may represent thymic hyperplasia or other soft tissue nodule. Recommend clinical correlation and follow-up. Electronically Signed   By: Ronney Asters M.D.   On: 05/06/2022 19:22   Result Date: 05/06/2022 CLINICAL DATA:  High probability for PE.  Concern for mass. EXAM: CT ANGIOGRAPHY CHEST WITH CONTRAST TECHNIQUE: Multidetector CT imaging of the chest was performed using the standard protocol during bolus administration of intravenous contrast. Multiplanar CT image reconstructions and MIPs were obtained to evaluate the vascular anatomy. RADIATION DOSE REDUCTION: This exam was performed according to the departmental dose-optimization program which includes automated exposure control, adjustment of the mA and/or kV according to patient size and/or use of iterative reconstruction technique. CONTRAST:  72m OMNIPAQUE IOHEXOL 350 MG/ML SOLN COMPARISON:  CT PE protocol 02/15/2012 FINDINGS: Cardiovascular: There is aneurysmal dilatation of the ascending aorta measuring 4.1 cm which is unchanged from prior. Heart is mildly enlarged. There is no pericardial effusion. There is adequate opacification of  the pulmonary arteries. Pulmonary emboli are seen within the distal right main pulmonary artery extending into lobar and segmental branches of the right lower lobe and right middle lobe. No central or saddle pulmonary embolism identified. Mediastinum/Nodes: Visualized thyroid gland is within normal limits. There is prominent soft tissue in the anterior mediastinum in the region of the thymus measuring up to 16 mm in thickness. No other enlarged lymph nodes are identified. Esophagus is unremarkable. Lungs/Pleura: Severe emphysematous changes have progressed compared to 2013. There are new large bullae within the bilateral lower lobes measuring up to 8 cm on the right and 7 cm on the left. There is adjacent compressive atelectasis. Fibrotic changes are seen in the lung bases, increased from prior. There is stable scarring in both lung apices. New focal ill-defined peripheral airspace opacity in the right upper lobe with slightly nodular component measuring 1.5 by 1.0 by 1.2 cm. Multifocal minimal ground-glass opacity seen in the right middle lobe, new from prior. Trachea and central airways are patent. Upper Abdomen: There are innumerable hypodense structures throughout the liver similar to the prior study, most likely cysts. Bilateral renal cysts are partially visualized. There are surgical clips in the upper abdomen. Musculoskeletal: No chest wall abnormality. No acute or significant osseous findings. Review of the MIP images confirms the above findings. IMPRESSION: 1. Pulmonary emboli within the distal right main pulmonary artery extending into lobar and segmental branches of the right lower lobe and right middle lobe. No evidence for right heart strain. 2. Patchy ground-glass opacities in the right middle lobe, likely infectious/inflammatory. 3. Progression of severe emphysema with large bullae in both lower lobes. 4.  Ill-defined peripheral focal opacity in the right upper lobe, slightly nodular, measuring up to  15 mm. This is indeterminate. Consider one of the following in 3 months for both low-risk and high-risk individuals: (a) repeat chest CT, (b) follow-up PET-CT, or (c) tissue sampling. This recommendation follows the consensus statement: Guidelines for Management of Incidental Pulmonary Nodules Detected on CT Images: From the Fleischner Society 2017; Radiology 2017; 284:228-243. 5. Progression of fibrotic changes in the lung bases. 6. Cardiomegaly. 7. Aneurysmal dilatation of the ascending aorta measuring 4.1 cm. Recommend annual imaging followup by CTA or MRA. This recommendation follows 2010 ACCF/AHA/AATS/ACR/ASA/SCA/SCAI/SIR/STS/SVM Guidelines for the Diagnosis and Management of Patients with Thoracic Aortic Disease. Circulation. 2010; 121: Z610-R604. Aortic aneurysm NOS (ICD10-I71.9) Aortic Atherosclerosis (ICD10-I70.0) and Emphysema (ICD10-J43.9). Electronically Signed: By: Ronney Asters M.D. On: 05/06/2022 19:06   DG Chest 2 View  Result Date: 05/06/2022 CLINICAL DATA:  Pneumonia. EXAM: CHEST - 2 VIEW COMPARISON:  Chest x-ray 04/01/2021 FINDINGS: Aorta is ectatic, unchanged. Heart size is mildly enlarged, unchanged. There is some patchy opacities in the left lower lung with possible new bulla. There is blunting of the right costophrenic angle. No pleural effusion. Reticulonodular opacities are seen in the lateral right upper lobe. No evidence for pneumothorax. No acute fractures. IMPRESSION: 1. Left lower lobe airspace disease with possible new bullae. 2. Reticulonodular opacities in the right upper lobe, indeterminate. Findings may be infectious/inflammatory. 3. Consider follow-up CT for further evaluation. Electronically Signed   By: Ronney Asters M.D.   On: 05/06/2022 16:14      Scheduled Meds:  insulin aspart  0-9 Units Subcutaneous TID AC & HS   metoprolol tartrate  25 mg Oral BID   tamsulosin  0.4 mg Oral Daily   trimethoprim-polymyxin b  1 drop Both Eyes Q6H   Continuous Infusions:   azithromycin Stopped (05/07/22 1005)   cefTRIAXone (ROCEPHIN)  IV Stopped (05/07/22 1005)   heparin 1,000 Units/hr (05/07/22 0707)   lactated ringers 100 mL/hr at 05/07/22 0508     LOS: 1 day     Dessa Phi, DO [facility] 05/07/2022, 1:47 PM   Available via Epic secure chat 7am-7pm After these hours, please refer to coverage provider listed on [email or web address]

## 2022-05-07 NOTE — Progress Notes (Addendum)
ANTICOAGULATION CONSULT NOTE  Pharmacy Consult for Heparin Indication: pulmonary embolus  Allergies  Allergen Reactions   Asa W/Codeine [Aspirin-Codeine] Other (See Comments)    Irritates his water system (urination burning)    Patient Measurements:   Heparin Dosing Weight: 51 kg  Vital Signs: Temp: 97.4 F (36.3 C) (07/14 1746) Temp Source: Axillary (07/14 1746) BP: 105/65 (07/14 1741) Pulse Rate: 60 (07/14 1741)  Labs: Recent Labs    05/06/22 1339 05/06/22 1715 05/06/22 2005 05/07/22 0519 05/07/22 1713  HGB 13.2  --   --  12.0*  --   HCT 42.5  --   --  36.2*  --   PLT 147*  --   --  148*  --   APTT  --   --   --  53*  --   LABPROT  --   --  17.7* 17.5*  --   INR  --   --  1.5* 1.5*  --   HEPARINUNFRC  --   --   --  <0.10* 0.19*  CREATININE 1.06  --   --  1.02  --   TROPONINIHS  --  14 12  --   --      CrCl cannot be calculated (Unknown ideal weight.).  Assessment: 56 yom with a history of A-fib, CAD status post MI with DES to the circumflex, T2DM, HTN, HLD, PAD . Patient is presenting with weakness. Heparin per pharmacy consult placed for pulmonary embolus on CTA.   Heparin level sub-therapeutic at 0.19 units/mL on 1000 units/hr.  RN reports there is some bloody urine.  Goal of Therapy:  Heparin level 0.3-0.7 units/ml Monitor platelets by anticoagulation protocol: Yes   Plan:  Increase heparin infusion to 1150 units/hr Check heparin level in 8 hours Monitor for bleeding resolution or worsening  Maleka Contino D. Mina Marble, PharmD, BCPS, [place] 05/07/2022, 6:14 PM

## 2022-05-07 NOTE — Assessment & Plan Note (Signed)
   Holding statin therapy due to deranged liver function

## 2022-05-07 NOTE — Progress Notes (Signed)
ANTICOAGULATION CONSULT NOTE - Pharmacy Consult for Heparin Indication: pulmonary embolus Brief A/P: Heparin held during MRI--rebolus and increase heparin rate  Allergies  Allergen Reactions   Asa W/Codeine [Aspirin-Codeine] Other (See Comments)    Irritates his water system (urination burning)    Patient Measurements:   Heparin Dosing Weight: 51 kg  Vital Signs: BP: 109/67 (07/14 0315) Pulse Rate: 56 (07/14 0315)  Labs: Recent Labs    05/06/22 1339 05/06/22 1715 05/06/22 2005 05/07/22 0519  HGB 13.2  --   --  12.0*  HCT 42.5  --   --  36.2*  PLT 147*  --   --  148*  APTT  --   --   --  53*  LABPROT  --   --  17.7* 17.5*  INR  --   --  1.5* 1.5*  HEPARINUNFRC  --   --   --  <0.10*  CREATININE 1.06  --   --  1.02  TROPONINIHS  --  14 12  --      CrCl cannot be calculated (Unknown ideal weight.).  Assessment: 88 y.o. male with PE for heparin. Heparin held ~ 90 min for MRI prior to level  Goal of Therapy:  Heparin level 0.3-0.7 units/ml Monitor platelets by anticoagulation protocol: Yes   Plan:  Heparin 2000 units IV bolus, then increase heparin 1000 units/hr Check heparin level in 8 hours.   Phillis Knack, PharmD, BCPS

## 2022-05-07 NOTE — Assessment & Plan Note (Signed)
   Appearance of right middle lobe infectious process on CT imaging  Placing patient on intravenous antibiotics with ceftriaxone and azithromycin  Blood cultures ordered  Supplemental oxygen as necessary  As needed breathing treatments

## 2022-05-07 NOTE — ED Notes (Signed)
Patient returned from MRI.  Infusions restarted.  Unable to draw labs at this time.

## 2022-05-07 NOTE — ED Notes (Signed)
Infusions paused for patient to go to MRI.  Attempted to draw AM labs and heparin level without success.  Will attempt when patient returns

## 2022-05-07 NOTE — Progress Notes (Signed)
  Echocardiogram 2D Echocardiogram has been performed.  Robert [name] F 05/07/2022, 3:03 PM

## 2022-05-07 NOTE — Assessment & Plan Note (Signed)
.   No clinical evidence of cardiogenic volume overload . Strict input and output monitoring . Daily weights . Low-sodium diet once n.p.o. is discontinued

## 2022-05-07 NOTE — Progress Notes (Signed)
Hypoglycemic Event  CBG: 65 at 2123  Treatment: D50 25 mL (12.5 gm) pt unable to take any po's safely due to mental status r/t dementia.  Symptoms: None  Follow-up CBG: Time:2202 CBG Result:83  Possible Reasons for Event: Inadequate meal intake  Comments/MD notified: Lennox Grumbles NP  Ayesha Mohair BSN RN [facility] ([place] 05/07/2022, 10:23 PM

## 2022-05-07 NOTE — Assessment & Plan Note (Signed)
   Initiating Polytrim eyedrops for 7 days

## 2022-05-07 NOTE — ED Notes (Signed)
Heparin level redraw sent to lab first draw lab called for hemolyses

## 2022-05-07 NOTE — Assessment & Plan Note (Addendum)
.   Patient is currently chest pain free . Monitoring patient on telemetry . Holding home regimen of clopidogrel . Holding statin therapy due to severely deranged liver function . Continue home regimen of metoprolol

## 2022-05-07 NOTE — Consult Note (Signed)
Referring Provider: [facility] Primary Care Physician:  Lavone Orn, MD Primary Gastroenterologist:  Former patient of Dr. Wynetta Emery  Reason for Consultation: Elevated LFTs with biliary dilation  HPI: Robert [name] is a 88 y.o. male past medical history of dementia, hypertension, hyperlipidemia, coronary artery disease (multiple remote MI's S/P cath in 1987 and cath in 2004 S/P DES to circumflex), diastolic congestive heart failure (Echo 08/2017 EF 55-60% with G1DD), non-insulin-dependent diabetes mellitus type 2, chronic atrial fibrillation and flutter (on Plavix), diabetes mellitus type 2, abdominal aortic aneurysm, presents for evaluation of weakness, weight loss, and elevated LFTs.  According to family, patient has exhibited progressive generalized weakness over the past 3 weeks.  Also rapid unintentional weight loss. MRCP showed dilation and pancreatic resulting in obstruction of the distal common bile duct and pancreatic ducts with severe proximal ductal dilation.  Small peripancreatic lymph nodes suspicious for potential nodal metastasis.  Biliary sludge. Normal LFTs with PCP 12/2021.  History obtained from daughter at bedside. Patient's daughter states around Fathers day patient was normal, eating normally and having conversations. He mentioned to her that "something in his insides didn't feel right" but never mentioned pain to her. She states she saw him 3 weeks ago and he was his baseline normal. She saw him yesterday and noticed he had lost about 25lbs. Reports when he drinks water or eats food he spits it back up. Unable to hold a conversation with her (history of dementia - though worse than baseline). Denies family history of colon cancer. No previous colonoscopies.  EGD with dilation done for dysphagia 02/2014 with Dr. Wynetta Emery: Post Billroth I and Billroth II gastrectomy severe ulcer disease, scattered erosions in the gastrum.  Biopsies show chronic active gastritis with H. pylori  Past Medical  History:  Diagnosis Date   Atrial fibrillation ([place])    CAD (coronary artery disease)    DES to Cfx   Diabetes mellitus without complication ([place])    Fall at home 06/15/2018   History of nuclear stress test 02/2012   lexiscan; mild-mod perfusion defect in basal inferoseptal, mid inferoseptal apical anterior; no change from previous study, abnormal test    Hyperlipidemia    Hypertension    Myocardial infarction ([place]) 1980s   PAD (peripheral artery disease) ([place])    aortic stent graft 2008   PUD (peptic ulcer disease)    Small bowel obstruction ([place]) 10/2011   Ulcer     Past Surgical History:  Procedure Laterality Date   CARDIAC CATHETERIZATION  1987   occluded marginal branch of RCA, treated medically - Dr Melvern Banker   CORONARY ANGIOPLASTY WITH STENT PLACEMENT  03/2003   2.5x31m Taxus DES to Cfx   ESOPHAGOGASTRODUODENOSCOPY N/A 03/14/2014   Procedure: ESOPHAGOGASTRODUODENOSCOPY (EGD) with Balloon Dilation;  Surgeon: MGarlan Fair MD;  Location: [facility] END[facility];  Service: Endoscopy;  Laterality: N/A;   EYE SURGERY Right    cataract surgery   GASTRECTOMY  50 yrs ago - 2 operations   had surgery at the age of 89for the ulcer   ILIAC ARTERY ANEURYSM REPAIR     Repaied by Dr. LKellie Simmeringapril 2009   JOINT REPLACEMENT Left    hip replacement   TRANSTHORACIC ECHOCARDIOGRAM  11/2009   EF=>55%, mild conc LVH, normal LV systolic function; mild MR; mild-mod TR with RV systolic pressure elevated at 30-431mg; mild-mod AV regurg; mild pulm valve regurg    Prior to Admission medications   Medication Sig Start Date End Date Taking? Authorizing Provider  atorvastatin (LIPITOR) 10 MG  tablet Take 1 tablet by mouth daily.   Yes [provider]  Ferrous Sulfate (IRON) 325 (65 Fe) MG TABS Take 1 tablet by mouth daily. 12/31/20  Yes [provider]  furosemide (LASIX) 40 MG tablet TAKE 1 TABLET(40 MG) BY MOUTH DAILY Patient taking differently: Take 40 mg by mouth daily. 10/20/21  Yes  Hilty, Nadean Corwin, MD  lisinopril (ZESTRIL) 20 MG tablet TAKE 1 TABLET(20 MG) BY MOUTH DAILY 07/09/21  Yes Hilty, Nadean Corwin, MD  metoprolol tartrate (LOPRESSOR) 25 MG tablet Take 25 mg by mouth 2 (two) times daily. 12/25/20  Yes [provider]  potassium chloride (KLOR-CON) 10 MEQ tablet Take 1 tablet by mouth daily.   Yes [provider]  tamsulosin (FLOMAX) 0.4 MG CAPS capsule Take 1 capsule by mouth daily. 04/04/19  Yes [provider]  clopidogrel (PLAVIX) 75 MG tablet TAKE 1 TABLET(75 MG) BY MOUTH DAILY Patient not taking: Reported on 05/06/2022 06/08/21   Pixie Casino, MD    Scheduled Meds:  insulin aspart  0-9 Units Subcutaneous TID AC & HS   metoprolol tartrate  25 mg Oral BID   tamsulosin  0.4 mg Oral Daily   trimethoprim-polymyxin b  1 drop Both Eyes Q6H   Continuous Infusions:  azithromycin     cefTRIAXone (ROCEPHIN)  IV     heparin 1,000 Units/hr (05/07/22 0707)   lactated ringers 100 mL/hr at 05/07/22 0508   PRN Meds:.acetaminophen **OR** acetaminophen, albuterol, hydrALAZINE, ondansetron **OR** ondansetron (ZOFRAN) IV, polyethylene glycol  Allergies as of 05/06/2022 - Review Complete 05/06/2022  Allergen Reaction Noted   Asa w/codeine [aspirin-codeine] Other (See Comments) 05/13/2011    Family History  Problem Relation Age of Onset   Stroke Father    Heart disease Father    Hypertension Father    Heart disease Daughter        Before age 31   Diabetes Sister        DVT-Blood clots in vein    Social History   Socioeconomic History   Marital status: Widowed    Spouse name: Not on file   Number of children: Not on file   Years of education: Not on file   Highest education level: Not on file  Occupational History   Not on file  Tobacco Use   Smoking status: Former    Years: 0.50    Types: Cigarettes    Quit date: 07/18/1992    Years since quitting: 29.8   Smokeless tobacco: Never   Tobacco comments:    quit around HA time   Vaping Use   Vaping Use: Never used  Substance and Sexual Activity   Alcohol use: No   Drug use: No   Sexual activity: Not on file  Other Topics Concern   Not on file  Social History Narrative   Used to be a Psychologist, 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   Social Determinants of Health   Financial Resource Strain: Not on file  Food Insecurity: Not on file  Transportation Needs: Not on file  Physical Activity: Not on file  Stress: Not on file  Social Connections: Not on file  Intimate Partner Violence: Not on file    Review of Systems: Review of Systems  Unable to perform ROS: Acuity of condition     Physical Exam:Physical Exam Constitutional:      Appearance: He is ill-appearing.     Comments: Thin, frail   HENT:     Head: Normocephalic and atraumatic.  Nose: Nose normal. No congestion.     Mouth/Throat:     Mouth: Mucous membranes are moist.     Pharynx: Oropharynx is clear.  Eyes:     General: Scleral icterus present.     Extraocular Movements: Extraocular movements intact.  Cardiovascular:     Rate and Rhythm: Normal rate and regular rhythm.  Pulmonary:     Effort: Pulmonary effort is normal. No respiratory distress.  Abdominal:     General: Bowel sounds are normal. There is no distension.     Palpations: Abdomen is soft. There is no mass.     Tenderness: There is no abdominal tenderness. There is no guarding or rebound.     Hernia: No hernia is present.  Musculoskeletal:        General: No swelling. Normal range of motion.     Cervical back: Normal range of motion and neck supple.  Skin:    General: Skin is warm and dry.  Neurological:     Mental Status: He is disoriented.     Vital signs: Vitals:   05/07/22 0315 05/07/22 0645  BP: 109/67 119/68  Pulse: (!) 56 73  Resp: (!) 8 12  Temp:    SpO2: 96% 96%        GI:  Lab Results: Recent Labs    05/06/22 1339 05/07/22 0519  WBC 5.4 4.8  HGB 13.2 12.0*  HCT 42.5 36.2*  PLT 147*  148*   BMET Recent Labs    05/06/22 1339 05/07/22 0519  NA 138 137  K 3.6 3.1*  CL 106 105  CO2 17* 23  GLUCOSE 123* 99  BUN 17 16  CREATININE 1.06 1.02  CALCIUM 8.5* 8.2*   LFT Recent Labs    05/06/22 1715 05/07/22 0519  PROT 6.5 6.0*  ALBUMIN 2.2* 2.2*  AST 82* 67*  ALT 74* 64*  ALKPHOS 440* 383*  BILITOT 11.9* 10.8*  BILIDIR 6.9*  --   IBILI 5.0*  --    PT/INR Recent Labs    05/06/22 2005 05/07/22 0519  LABPROT 17.7* 17.5*  INR 1.5* 1.5*     Studies/Results: MR ABDOMEN MRCP W WO CONTAST  Result Date: 05/07/2022 CLINICAL DATA:  88 year old male with history of jaundice. EXAM: MRI ABDOMEN WITHOUT AND WITH CONTRAST (INCLUDING MRCP) TECHNIQUE: Multiplanar multisequence MR imaging of the abdomen was performed both before and after the administration of intravenous contrast. Heavily T2-weighted images of the biliary and pancreatic ducts were obtained, and three-dimensional MRCP images were rendered by post processing. CONTRAST:  36m GADAVIST GADOBUTROL 1 MMOL/ML IV SOLN COMPARISON:  No prior abdominal MRI. CT the abdomen and pelvis 05/06/2022. FINDINGS: Comment: Today's study is exceedingly limited by extensive patient respiratory motion and suboptimal contrast bolus (the majority of the postcontrast images demonstrate no intravascular gadolinium). Lower chest: Increased susceptibility artifact in the dependent portions of the lower thorax bilaterally corresponding to extensive bullous disease in this region on recent chest CT. Small right pleural effusion. Hepatobiliary: Severely limited assessment secondary to suboptimal contrast bolus and patient respiratory motion. With these limitations in mind, there is severe intrahepatic biliary ductal dilatation and diffuse periportal edema. Several well-defined T1 hypointense, T2 hyperintense, nonenhancing lesions scattered throughout the liver correspond to multiple cysts noted on prior CT examinations, measuring up to 3.3 x 2.8 cm  in segment 8 (axial image 6 of series 4). No clearly aggressive intrahepatic lesion confidently identified within the limitations of today's examination. Gallbladder is severely distended. In the dependent portion of the  gallbladder there is some T1 hyperintense, T2 hypointense amorphous material lying dependently, most likely to represent biliary sludge. Gallbladder wall thickness is upper limits of normal at 3 mm. Trace amount of pericholecystic fluid, nonspecific in the setting of trace volume of ascites. Cystic duct is dilated. Shortly after the confluence of the common hepatic duct and the cystic duct there is some focal narrowing of the proximal common bile duct, which appears to demonstrate some peripheral enhancement best appreciated on axial image 47 of series 19. Slightly more distal to this best appreciated on coronal MRCP image 29 of series 11 there is irregularity in and around the common bile duct, resulting in complete obliteration of the lumen. Whether this is secondary to extrinsic or intrinsic obstruction is uncertain on today's limited study. Pancreas: Severe dilatation of the main pancreatic duct and many pancreatic ductal side branches, with the main pancreatic duct measuring up to 1.8 cm in the neck of the pancreas. Multiple cystic appearing regions in the head of the pancreas appear to predominantly represent dilated side branches, however, the pancreatic head appears enlarged relative to the rest of the pancreas which is otherwise severely atrophic, and in the region of the common bile duct obliteration in the pancreatic head (axial image 57 of series 19) this mass-like appearance has multiple internal cystic regions and relatively increased parenchymal enhancement, estimated to measure approximately 5.3 x 4.9 cm. Whether there is truly a lesion in this region or not is uncertain on today's limited study, however, the coincident obliteration of the common bile duct and main pancreatic duct in  these regions, with severe proximal dilatation raises concern for neoplasm. Spleen:  Unremarkable. Adrenals/Urinary Tract: Multiple T1 hypointense, T2 hyperintense, nonenhancing lesions in both kidneys, compatible with simple cysts, largest of which is exophytic in the lateral aspect of the interpolar region of the left kidney measuring up to 7.3 cm in diameter. No hydroureteronephrosis in the visualized portions of the abdomen. Bilateral adrenal glands are normal in appearance. Stomach/Bowel: Visualized portions are unremarkable. Vascular/Lymphatic: Aorto bi-iliac stent graft noted, apparently patent on today's limited examination. Multiple prominent borderline enlarged peripancreatic lymph nodes adjacent to the pancreatic head, measuring up to 9 mm (axial image 54 of series 19). Other:  Trace volume of ascites. Musculoskeletal: No aggressive appearing osseous lesions are noted in the visualized portions of the skeleton. IMPRESSION: 1. Severely limited examination which is highly suspicious for a complex cystic and solid lesion in the pancreatic head resulting in obstruction of the distal common bile duct and pancreatic ducts, with severe proximal ductal dilatation. Small peripancreatic lymph nodes suspicious for potential nodal metastasis. Further evaluation with endoscopic ultrasound and/or ERCP is recommended to better evaluate these findings. 2. Biliary sludge lying dependently in the gallbladder. Gallbladder is severely distended. Gallbladder wall thickness appears upper limits of normal, and there is a trace volume of pericholecystic fluid (assessment is limited by presence of small volume of ascites) such that the possibility of early acute cholecystitis is not excluded. 3. Severe bullous emphysema in the lung bases. Clinical correlation for evidence of alpha-1 antitrypsin deficiency is recommended. 4. Additional incidental findings, as above. Electronically Signed   By: Vinnie Langton M.D.   On:  05/07/2022 05:48   CT ABDOMEN PELVIS W CONTRAST  Result Date: 05/06/2022 CLINICAL DATA:  Pulmonary nodule, metastatic disease EXAM: CT ABDOMEN AND PELVIS WITH CONTRAST TECHNIQUE: Multidetector CT imaging of the abdomen and pelvis was performed using the standard protocol following bolus administration of intravenous contrast. RADIATION DOSE REDUCTION:  This exam was performed according to the departmental dose-optimization program which includes automated exposure control, adjustment of the mA and/or kV according to patient size and/or use of iterative reconstruction technique. CONTRAST:  26m OMNIPAQUE IOHEXOL 300 MG/ML  SOLN COMPARISON:  04/01/2021 FINDINGS: Lower chest: Prominent bulla are noted within the lung bases bilaterally better assessed on previously performed chest CT at 6:20 p.m. Patchy ground-glass pulmonary infiltrates are noted within the visualized lung bases, possibly infectious or inflammatory in nature. Extensive multi-vessel coronary artery calcification. Cardiac size is within normal limits. Hepatobiliary: There is interval development of marked intra and extrahepatic biliary ductal dilation to the level of the ampulla where the extrahepatic bile duct is obliterated, best seen on image # 37/3 and 63/5. The gallbladder is markedly distended in keeping with an obstructive biliary process. Multiple hepatic cysts are again identified. No enhancing intrahepatic mass identified. Pancreas: The main pancreatic duct is markedly dilated, new since prior examination. The duct also appears occluded in the region of the ampulla, best seen on axial image # 39/3 and 61/5. Multiple varicoid cystic lesions in this region of the pancreatic head may represent dilated pancreatic side branches. A discrete mass is not clearly identified on this limited examination, however. Spleen: Unremarkable Adrenals/Urinary Tract: The adrenal glands are unremarkable. The kidneys are normal in size and position. Multiple  simple cortical cysts are seen within the kidneys bilaterally. No further follow-up is recommended for these lesions there is no hydronephrosis. Contrast from prior chest CT examination is seen within the renal collecting system. The bladder is unremarkable. Stomach/Bowel: Evaluation of the bowel is markedly limited by lack of intra-abdominal fat, diffuse anasarca, and phase of contrast enhancement. The large and small bowel are grossly unremarkable. No evidence of obstruction. No free intraperitoneal gas or fluid. Vascular/Lymphatic: Abdominal aortic aneurysm pair utilizing a bifurcated stent graft is again identified. No residual abdominal aortic aneurysm. Extensive aortoiliac atherosclerotic calcification. Evaluation of the retroperitoneal soft tissues is markedly limited by extensive retroperitoneal edema though no definite pathologic adenopathy is identified. Reproductive: The prostate gland is unremarkable. Other: There is diffuse body wall wasting and edema in keeping with anasarca. Musculoskeletal: No acute bone abnormality. Degenerative changes are seen within the lumbar spine. Left total hip arthroplasty has been performed. IMPRESSION: 1. Interval development of marked intra and extrahepatic biliary ductal dilation to the level of the ampulla where the extrahepatic bile duct is obliterated. Interval development of marked dilation of the main pancreatic duct. Multiple varicoid cystic lesions in the region of the pancreatic head may represent dilated pancreatic side branches. A discrete mass is not clearly identified on this limited examination. Correlation with EUS/ERCP or MRI/MRCP examination may be helpful for further evaluation. 2. Patchy ground-glass pulmonary infiltrates within the visualized lung bases, possibly infectious or inflammatory in nature. 3. Diffuse body wall wasting and edema in keeping with anasarca. Extensive body wall and retroperitoneal edema limits evaluation of the a intraperitoneal  and retroperitoneal structures. 4. Extensive multi-vessel coronary artery calcification. 5. Abdominal aortic aneurysm utilizing a bifurcated stent graft. No residual abdominal aortic aneurysm. Electronically Signed   By: AFidela SalisburyM.D.   On: 05/06/2022 21:09   CT Angio Chest PE W and/or Wo Contrast  Addendum Date: 05/06/2022   ADDENDUM REPORT: 05/06/2022 19:22 ADDENDUM: Additional IMPRESSION: 8. Prominent soft tissue in the region of the thymus. Findings may represent thymic hyperplasia or other soft tissue nodule. Recommend clinical correlation and follow-up. Electronically Signed   By: ARonney AstersM.D.   On: 05/06/2022  19:22   Result Date: 05/06/2022 CLINICAL DATA:  High probability for PE.  Concern for mass. EXAM: CT ANGIOGRAPHY CHEST WITH CONTRAST TECHNIQUE: Multidetector CT imaging of the chest was performed using the standard protocol during bolus administration of intravenous contrast. Multiplanar CT image reconstructions and MIPs were obtained to evaluate the vascular anatomy. RADIATION DOSE REDUCTION: This exam was performed according to the departmental dose-optimization program which includes automated exposure control, adjustment of the mA and/or kV according to patient size and/or use of iterative reconstruction technique. CONTRAST:  19m OMNIPAQUE IOHEXOL 350 MG/ML SOLN COMPARISON:  CT PE protocol 02/15/2012 FINDINGS: Cardiovascular: There is aneurysmal dilatation of the ascending aorta measuring 4.1 cm which is unchanged from prior. Heart is mildly enlarged. There is no pericardial effusion. There is adequate opacification of the pulmonary arteries. Pulmonary emboli are seen within the distal right main pulmonary artery extending into lobar and segmental branches of the right lower lobe and right middle lobe. No central or saddle pulmonary embolism identified. Mediastinum/Nodes: Visualized thyroid gland is within normal limits. There is prominent soft tissue in the anterior mediastinum in  the region of the thymus measuring up to 16 mm in thickness. No other enlarged lymph nodes are identified. Esophagus is unremarkable. Lungs/Pleura: Severe emphysematous changes have progressed compared to 2013. There are new large bullae within the bilateral lower lobes measuring up to 8 cm on the right and 7 cm on the left. There is adjacent compressive atelectasis. Fibrotic changes are seen in the lung bases, increased from prior. There is stable scarring in both lung apices. New focal ill-defined peripheral airspace opacity in the right upper lobe with slightly nodular component measuring 1.5 by 1.0 by 1.2 cm. Multifocal minimal ground-glass opacity seen in the right middle lobe, new from prior. Trachea and central airways are patent. Upper Abdomen: There are innumerable hypodense structures throughout the liver similar to the prior study, most likely cysts. Bilateral renal cysts are partially visualized. There are surgical clips in the upper abdomen. Musculoskeletal: No chest wall abnormality. No acute or significant osseous findings. Review of the MIP images confirms the above findings. IMPRESSION: 1. Pulmonary emboli within the distal right main pulmonary artery extending into lobar and segmental branches of the right lower lobe and right middle lobe. No evidence for right heart strain. 2. Patchy ground-glass opacities in the right middle lobe, likely infectious/inflammatory. 3. Progression of severe emphysema with large bullae in both lower lobes. 4. Ill-defined peripheral focal opacity in the right upper lobe, slightly nodular, measuring up to 15 mm. This is indeterminate. Consider one of the following in 3 months for both low-risk and high-risk individuals: (a) repeat chest CT, (b) follow-up PET-CT, or (c) tissue sampling. This recommendation follows the consensus statement: Guidelines for Management of Incidental Pulmonary Nodules Detected on CT Images: From the Fleischner Society 2017; Radiology 2017;  284:228-243. 5. Progression of fibrotic changes in the lung bases. 6. Cardiomegaly. 7. Aneurysmal dilatation of the ascending aorta measuring 4.1 cm. Recommend annual imaging followup by CTA or MRA. This recommendation follows 2010 ACCF/AHA/AATS/ACR/ASA/SCA/SCAI/SIR/STS/SVM Guidelines for the Diagnosis and Management of Patients with Thoracic Aortic Disease. Circulation. 2010; 121:: [id number] Aortic aneurysm NOS (ICD10-I71.9) Aortic Atherosclerosis (ICD10-I70.0) and Emphysema (ICD10-J43.9). Electronically Signed: By: ARonney AstersM.D. On: 05/06/2022 19:06   DG Chest 2 View  Result Date: 05/06/2022 CLINICAL DATA:  Pneumonia. EXAM: CHEST - 2 VIEW COMPARISON:  Chest x-ray 04/01/2021 FINDINGS: Aorta is ectatic, unchanged. Heart size is mildly enlarged, unchanged. There is some patchy opacities in the  left lower lung with possible new bulla. There is blunting of the right costophrenic angle. No pleural effusion. Reticulonodular opacities are seen in the lateral right upper lobe. No evidence for pneumothorax. No acute fractures. IMPRESSION: 1. Left lower lobe airspace disease with possible new bullae. 2. Reticulonodular opacities in the right upper lobe, indeterminate. Findings may be infectious/inflammatory. 3. Consider follow-up CT for further evaluation. Electronically Signed   By: Ronney Asters M.D.   On: 05/06/2022 16:14    Impression: Elevated LFTs, obstructive jaundice -AST 67/ALT 64/alk phos 383 -T. bili 10.8 -No leukocytosis -Platelets 148 -AFP, CEA, CA 19-9 pending -MRCP 7/14: Solid lesion in pancreatic head resulting in obstruction of the distal common bile duct and pancreatic ducts with severe proximal ductal dilation.  Small peripancreatic lymph nodes suspicious for potential nodal metastasis.  Biliary sludge  PE - on heparin  Pneumonia - on rocephin and azithromycin  Diastolic congestive heart failure -Echo 08/2017 shows ejection fraction 55 to 60%  A-fib - On  Plavix  Plan: Patient currently being treated for PE and pneumonia. At some point will need ERCP and EUS, though will discuss timing of these procedures with Dr. Watt Climes and Dr. Paulita Fujita. Continue supportive care Continue to hold plavix Continue to trend LFTs. [facility] will follow.   LOS: 1 day   Garnette Scheuermann  PA-C 05/07/2022, 8:03 AM  Contact #  [phone]

## 2022-05-07 NOTE — Assessment & Plan Note (Signed)
   Continue home regimen metoprolol  As needed intravenous antihypertensives for markedly elevated blood pressure

## 2022-05-08 DIAGNOSIS — H109 Unspecified conjunctivitis: Secondary | ICD-10-CM | POA: Diagnosis not present

## 2022-05-08 DIAGNOSIS — Z789 Other specified health status: Secondary | ICD-10-CM

## 2022-05-08 DIAGNOSIS — L8990 Pressure ulcer of unspecified site, unspecified stage: Secondary | ICD-10-CM | POA: Insufficient documentation

## 2022-05-08 DIAGNOSIS — R638 Other symptoms and signs concerning food and fluid intake: Secondary | ICD-10-CM

## 2022-05-08 DIAGNOSIS — Z711 Person with feared health complaint in whom no diagnosis is made: Secondary | ICD-10-CM

## 2022-05-08 DIAGNOSIS — Z515 Encounter for palliative care: Secondary | ICD-10-CM

## 2022-05-08 DIAGNOSIS — I2699 Other pulmonary embolism without acute cor pulmonale: Secondary | ICD-10-CM | POA: Diagnosis not present

## 2022-05-08 DIAGNOSIS — Z7189 Other specified counseling: Secondary | ICD-10-CM

## 2022-05-08 DIAGNOSIS — Z66 Do not resuscitate: Secondary | ICD-10-CM

## 2022-05-08 DIAGNOSIS — K8689 Other specified diseases of pancreas: Secondary | ICD-10-CM | POA: Diagnosis not present

## 2022-05-08 DIAGNOSIS — R627 Adult failure to thrive: Secondary | ICD-10-CM

## 2022-05-08 LAB — CBC WITH DIFFERENTIAL/PLATELET
Abs Immature Granulocytes: 0.03 10*3/uL (ref 0.00–0.07)
Basophils Absolute: 0 10*3/uL (ref 0.0–0.1)
Basophils Relative: 0 %
Eosinophils Absolute: 0 10*3/uL (ref 0.0–0.5)
Eosinophils Relative: 1 %
HCT: 36.2 % — ABNORMAL LOW (ref 39.0–52.0)
Hemoglobin: 12 g/dL — ABNORMAL LOW (ref 13.0–17.0)
Immature Granulocytes: 1 %
Lymphocytes Relative: 7 %
Lymphs Abs: 0.4 10*3/uL — ABNORMAL LOW (ref 0.7–4.0)
MCH: 30.5 pg (ref 26.0–34.0)
MCHC: 33.1 g/dL (ref 30.0–36.0)
MCV: 92.1 fL (ref 80.0–100.0)
Monocytes Absolute: 0.3 10*3/uL (ref 0.1–1.0)
Monocytes Relative: 6 %
Neutro Abs: 4.1 10*3/uL (ref 1.7–7.7)
Neutrophils Relative %: 85 %
Platelets: 148 10*3/uL — ABNORMAL LOW (ref 150–400)
RBC: 3.93 MIL/uL — ABNORMAL LOW (ref 4.22–5.81)
RDW: 16.3 % — ABNORMAL HIGH (ref 11.5–15.5)
WBC: 4.8 10*3/uL (ref 4.0–10.5)
nRBC: 0 % (ref 0.0–0.2)

## 2022-05-08 LAB — CBC
HCT: 36.9 % — ABNORMAL LOW (ref 39.0–52.0)
Hemoglobin: 12.4 g/dL — ABNORMAL LOW (ref 13.0–17.0)
MCH: 31 pg (ref 26.0–34.0)
MCHC: 33.6 g/dL (ref 30.0–36.0)
MCV: 92.3 fL (ref 80.0–100.0)
Platelets: 162 10*3/uL (ref 150–400)
RBC: 4 MIL/uL — ABNORMAL LOW (ref 4.22–5.81)
RDW: 16.6 % — ABNORMAL HIGH (ref 11.5–15.5)
WBC: 4.6 10*3/uL (ref 4.0–10.5)
nRBC: 0 % (ref 0.0–0.2)

## 2022-05-08 LAB — COMPREHENSIVE METABOLIC PANEL
ALT: 63 U/L — ABNORMAL HIGH (ref 0–44)
AST: 64 U/L — ABNORMAL HIGH (ref 15–41)
Albumin: 2 g/dL — ABNORMAL LOW (ref 3.5–5.0)
Alkaline Phosphatase: 345 U/L — ABNORMAL HIGH (ref 38–126)
Anion gap: 12 (ref 5–15)
BUN: 13 mg/dL (ref 8–23)
CO2: 21 mmol/L — ABNORMAL LOW (ref 22–32)
Calcium: 8.2 mg/dL — ABNORMAL LOW (ref 8.9–10.3)
Chloride: 107 mmol/L (ref 98–111)
Creatinine, Ser: 0.84 mg/dL (ref 0.61–1.24)
GFR, Estimated: >60 mL/min (ref >60)
Glucose, Bld: 117 mg/dL — ABNORMAL HIGH (ref 70–99)
Potassium: 3.4 mmol/L — ABNORMAL LOW (ref 3.5–5.1)
Sodium: 140 mmol/L (ref 135–145)
Total Bilirubin: 9.7 mg/dL — ABNORMAL HIGH (ref 0.3–1.2)
Total Protein: 5.9 g/dL — ABNORMAL LOW (ref 6.5–8.1)

## 2022-05-08 LAB — GLUCOSE, CAPILLARY
Glucose-Capillary: 104 mg/dL — ABNORMAL HIGH (ref 70–99)
Glucose-Capillary: 109 mg/dL — ABNORMAL HIGH (ref 70–99)
Glucose-Capillary: 111 mg/dL — ABNORMAL HIGH (ref 70–99)
Glucose-Capillary: 123 mg/dL — ABNORMAL HIGH (ref 70–99)
Glucose-Capillary: 135 mg/dL — ABNORMAL HIGH (ref 70–99)
Glucose-Capillary: 158 mg/dL — ABNORMAL HIGH (ref 70–99)
Glucose-Capillary: 93 mg/dL (ref 70–99)
Glucose-Capillary: 96 mg/dL (ref 70–99)

## 2022-05-08 LAB — POTASSIUM: Potassium: 3.7 mmol/L (ref 3.5–5.1)

## 2022-05-08 LAB — AFP TUMOR MARKER: AFP, Serum, Tumor Marker: 3.1 ng/mL (ref 0.0–6.4)

## 2022-05-08 LAB — HEPARIN LEVEL (UNFRACTIONATED)
Heparin Unfractionated: 0.57 IU/mL (ref 0.30–0.70)
Heparin Unfractionated: 0.81 IU/mL — ABNORMAL HIGH (ref 0.30–0.70)
Heparin Unfractionated: 0.65 IU/mL (ref 0.30–0.70)

## 2022-05-08 LAB — CANCER ANTIGEN 19-9: CA 19-9: 27 U/mL (ref 0–35)

## 2022-05-08 LAB — CEA: CEA: 12.9 ng/mL — ABNORMAL HIGH (ref 0.0–4.7)

## 2022-05-08 MED ORDER — METOPROLOL TARTRATE 5 MG/5ML IV SOLN
5.0000 mg | Freq: Four times a day (QID) | INTRAVENOUS | Status: DC
  Administered 2022-05-08: 5 mg via INTRAVENOUS
  Filled 2022-05-08 (×2): qty 5

## 2022-05-08 MED ORDER — POTASSIUM CHLORIDE 10 MEQ/100ML IV SOLN
10.0000 meq | INTRAVENOUS | Status: AC
  Administered 2022-05-08 (×3): 10 meq via INTRAVENOUS
  Filled 2022-05-08 (×3): qty 100

## 2022-05-08 MED ORDER — POTASSIUM CHLORIDE 10 MEQ/100ML IV SOLN
10.0000 meq | Freq: Once | INTRAVENOUS | Status: AC
Start: 2022-05-08 — End: 2022-05-08
  Administered 2022-05-08: 10 meq via INTRAVENOUS

## 2022-05-08 MED ORDER — ENSURE ENLIVE PO LIQD: 237.0000 mL | Freq: Three times a day (TID) | ORAL | Status: DC

## 2022-05-08 MED ORDER — LORAZEPAM 2 MG/ML IJ SOLN
0.5000 mg | Freq: Once | INTRAMUSCULAR | Status: AC
  Administered 2022-05-08: 0.5 mg via INTRAVENOUS
  Filled 2022-05-08: qty 1

## 2022-05-08 NOTE — Plan of Care (Signed)
  Problem: Pain Managment: Goal: General experience of comfort will improve Outcome: Progressing   Problem: Safety: Goal: Ability to remain free from injury will improve Outcome: Progressing   Problem: Nutrition: Goal: Adequate nutrition will be maintained Outcome: Not Progressing   

## 2022-05-08 NOTE — Progress Notes (Signed)
Initial Nutrition Assessment  DOCUMENTATION CODES:   Underweight; suspect severe malnutrition  INTERVENTION:   When diet advanced, start Ensure Enlive po TID, each supplement provides 350 kcal and 20 grams of protein.  RD to follow and assist with maximizing nutrition intake according to goals of care.  NUTRITION DIAGNOSIS:   Increased nutrient needs related to acute illness (pancreatic mass with recent poor intake and weight loss) as evidenced by estimated needs.  GOAL:   Patient will meet greater than or equal to 90% of their needs  MONITOR:   Diet advancement, PO intake  REASON FOR ASSESSMENT:   Consult Poor PO  ASSESSMENT:   88 yo male admitted with pancreatic mass, acute pulmonary embolism, PNA. PMH includes dementia, HTN, HLD, CAD, CHF, DM-2, A fib and flutter, AAA, remote gastric ulcer, S/P Bilroth I and II.  RD working remotely. Unable to reach patient/family by phone. Unable to complete NFPE. Per H&P, patient has had a 25 lb weight loss over the past 3 weeks with generalized weakness, poor intake, and vomiting shortly after ingesting food.  Potential underlying malignancy process or mets vs second primary malignancy. Plans for ERCP if work-up continues.  Currently NPO.  Weight history reviewed. Patient with 15 lb (12%) weight loss over the past year, which is not significant for the time frame. No weight encounters available between 1 year ago and now, so weight loss could have happened quicker than one year and may be significant. Per family report, weight loss has occurred over the past 3 weeks, which would be severe.   Suspect patient is malnourished given reported poor intake, weight loss, and BMI=16; unable to obtain enough information at this time for identification of malnutrition.   Palliative care team has been consulted for goals of care, given new pancreatic mass.   Labs reviewed. K 3.4, A1C 7.4 CBG: [phone]  Medications reviewed and include  Novolog, Flomax.  NUTRITION - FOCUSED PHYSICAL EXAM:  Unable to complete  Diet Order:   Diet Order             Diet NPO time specified  Diet effective now                   EDUCATION NEEDS:   Not appropriate for education at this time  Skin:  Skin Assessment: Skin Integrity Issues: Skin Integrity Issues:: Stage I Stage I: sacrum  Last BM:  7/12  Height:   Ht Readings from Last 1 Encounters:  05/08/22 '5\' 11"'$  (1.803 m)    Weight:   Wt Readings from Last 1 Encounters:  05/08/22 52 kg    Ideal Body Weight:  78.2 kg  BMI:  Body mass index is 15.99 kg/m.  Estimated Nutritional Needs:   Kcal:  2000-2200  Protein:  95-105 gm  Fluid:  2 L    Lucas Mallow RD, LDN, CNSC Please refer to Amion for contact information.

## 2022-05-08 NOTE — Progress Notes (Signed)
ANTICOAGULATION CONSULT NOTE - Follow Up Consult  Pharmacy Consult for Heparin Indication: pulmonary embolus  Allergies  Allergen Reactions   Diona Fanti W/Codeine [Aspirin-Codeine] Other (See Comments)    Irritates his water system (urination burning)    Patient Measurements: Heparin Dosing Weight: 51 kg  Vital Signs: Temp: 98.6 F (37 C) (07/15 2008) Temp Source: Oral (07/15 2008) BP: 106/77 (07/15 2008) Pulse Rate: 100 (07/15 2008)  Labs: Recent Labs    05/06/22 1339 05/06/22 1715 05/06/22 2005 05/07/22 0519 05/07/22 1713 05/08/22 0419 05/08/22 1235 05/08/22 2151  HGB 13.2  --   --  12.0*  --  12.4*  --   --   HCT 42.5  --   --  36.2*  --  36.9*  --   --   PLT 147*  --   --  148*  --  162  --   --   APTT  --   --   --  53*  --   --   --   --   LABPROT  --   --  17.7* 17.5*  --   --   --   --   INR  --   --  1.5* 1.5*  --   --   --   --   HEPARINUNFRC  --   --   --  <0.10*   < > 0.57 0.81* 0.65  CREATININE 1.06  --   --  1.02  --  0.84  --   --   TROPONINIHS  --  14 12  --   --   --   --   --    < > = values in this interval not displayed.     Estimated Creatinine Clearance: 44.7 mL/min (by C-G formula based on SCr of 0.84 mg/dL).   Medications:  Scheduled:   dextrose  12.5 g Intravenous STAT   feeding supplement  237 mL Oral TID BM   insulin aspart  0-9 Units Subcutaneous TID AC & HS   metoprolol tartrate  5 mg Intravenous Q6H   tamsulosin  0.4 mg Oral Daily   trimethoprim-polymyxin b  1 drop Both Eyes Q6H   Infusions:   azithromycin Stopped (05/08/22 0631)   cefTRIAXone (ROCEPHIN)  IV Stopped (05/08/22 0616)   dextrose 5 % and 0.9% NaCl 75 mL/hr at 05/08/22 1225   heparin 1,050 Units/hr (05/08/22 1900)    Assessment: 88 yo M with a history of A-fib, CAD status post MI with DES to the circumflex, T2DM, HTN, HLD, PAD. Patient is presenting with weakness. CTA PE w/ PE in distal rt main pulmonary artery extending into lobar and segmental branches of RLL.  Patient is not on anticoagulation prior to arrival. Pharmacy consulted for heparin dosing.  Heparin level today is therapeutic at 0.57, on 1150 units/hr. Hgb 12.4, plt 162. Patient was previously noted to have some hematuria yesterday; now with dark amber colored urine per RN. No line issues or signs/symptoms of bleeding noted per RN.   7/15: Heparin level therapeutic at 0.65. CBC stable. Urine is very amber colored and not as dark as on 7/14. Reduced dose slightly.  Goal of Therapy:  Heparin level 0.3-0.7 units/ml Monitor platelets by anticoagulation protocol: Yes   Plan:  Decrease IV heparin at 1000 units/hr. Check ~8 hr heparin level.  Daily CBC, heparin level. Monitor for signs/symptoms of bleeding.  Sandford Craze, PharmD. [facility] Acute [facility] PGY-1 05/08/2022 11:01 PM    Please check AMION for  all [place] phone numbers After 10:00 PM, call [place] [phone]

## 2022-05-08 NOTE — Progress Notes (Signed)
Mobility Specialist Progress Note:   05/08/22 0920  Mobility  Activity Dangled on edge of bed  Level of Assistance Dependent, patient does less than 25%  Assistive Device None  Activity Response Tolerated well  $Mobility charge 1 Mobility   Pt received asleep in bed with family member at bedside. Family member states within the past 3 weeks, pts LOF has significantly declined. Pt able to achieve sitting EOB with TotalA. Pt able to maintain seated position once EOB, for ~62mn. Performed light ROM with BLE, pt tolerated well. Left in bed with all needs met, bed alarm on.   [facility] Secure Chat or Office Phone: [phone]

## 2022-05-08 NOTE — Evaluation (Signed)
Clinical/Bedside Swallow Evaluation Patient Details  Name: Robert [name] MRN: [id number] Date of Birth: 11-07-1931  Today's Date: 05/08/2022 Time: SLP Start Time (ACUTE ONLY): 28 SLP Stop Time (ACUTE ONLY): 9147 SLP Time Calculation (min) (ACUTE ONLY): 38 min  Past Medical History:  Past Medical History:  Diagnosis Date   Atrial fibrillation ([place])    CAD (coronary artery disease)    DES to Cfx   Diabetes mellitus without complication ([place])    Fall at home 06/15/2018   History of nuclear stress test 02/2012   lexiscan; mild-mod perfusion defect in basal inferoseptal, mid inferoseptal apical anterior; no change from previous study, abnormal test    Hyperlipidemia    Hypertension    Myocardial infarction ([place]) 1980s   PAD (peripheral artery disease) ([place])    aortic stent graft 2008   PUD (peptic ulcer disease)    Small bowel obstruction ([place]) 10/2011   Ulcer    Past Surgical History:  Past Surgical History:  Procedure Laterality Date   CARDIAC CATHETERIZATION  1987   occluded marginal branch of RCA, treated medically - Dr Melvern Banker   CORONARY ANGIOPLASTY WITH STENT PLACEMENT  03/2003   2.5x9m Taxus DES to Cfx   ESOPHAGOGASTRODUODENOSCOPY N/A 03/14/2014   Procedure: ESOPHAGOGASTRODUODENOSCOPY (EGD) with Balloon Dilation;  Surgeon: MGarlan Fair MD;  Location: [facility] ENDOSCOPY;  Service: Endoscopy;  Laterality: N/A;   EYE SURGERY Right    cataract surgery   GASTRECTOMY  50 yrs ago - 2 operations   had surgery at the age of 89for the ulcer   ILIAC ARTERY ANEURYSM REPAIR     Repaied by Dr. LKellie Simmeringapril 2009   JOINT REPLACEMENT Left    hip replacement   TRANSTHORACIC ECHOCARDIOGRAM  11/2009   EF=>55%, mild conc LVH, normal LV systolic function; mild MR; mild-mod TR with RV systolic pressure elevated at 30-457mg; mild-mod AV regurg; mild pulm valve regurg   HPI:  Pt is an [age]o male presenting from his PCP's office with generalized weakness, hypotension, and rapid, unintentional  weight loss. Pt found to have pancreatic mass, acute PE, and PNA. Per GI note, daughter has noticed "when he drinks water or eats food he spits it back up." PMH includes: dementia, EGD with dilation, HTN, HLD, CAD, CHF, DMII, afib, AAA, remote gastric ulcer, S/P Bilroth I and II.    Assessment / Plan / Recommendation  Clinical Impression  Pt presents with signs of an acute dysphagia, including delayed oral transit and multiple swallows with audible sound with swallowing that makes SLP question how efficiently boluses are able to clear his pharynx. He coughed on his initial sip of thin liquids but had no other overt s/s of aspiration. With cognitive status and hearing loss it is challenging to get him to follow commands and he only takes in a little bit at a time. Discussed options with family. At this time, they are interested in pursuing work up and would want to complete MBS. This can be scheduled as soon as possible, but with other procedures planned for Monday, they are aware that it is possible that it might be Tuesday at the earliest. In the interim, they are hoping that he could at least try to eat and drink a little bit. Education was provided about potential for aspiration and risks of aspiration. We can monitor for overt coughing, but can't rule out the possibility of silent or intermittent aspiration and/or post-prandial aspiration if he were to regurgitate as he has been at home. They  would like to proceed cautiously at this time, allowing small, single sips of thin liquids or small bites of puree from the floor stock, monitoring carefully for any overt signs of aspiration and doing small volumes to see if he can keep this down. SLP will f/u for ongoing differential diagnosis and completion of MBS as able. SLP Visit Diagnosis: Dysphagia, unspecified (R13.10)    Aspiration Risk  Moderate aspiration risk;Risk for inadequate nutrition/hydration    Diet Recommendation Other (Comment) (small bites  of purees and sips of thin liquids from floor stock)   Liquid Administration via: Cup;Straw;Spoon Medication Administration: Via alternative means Supervision: Full supervision/cueing for compensatory strategies Postural Changes: Seated upright at 90 degrees;Remain upright for at least 30 minutes after po intake    Other  Recommendations Oral Care Recommendations: Oral care QID Other Recommendations: Have oral suction available    Recommendations for follow up therapy are one component of a multi-disciplinary discharge planning process, led by the attending physician.  Recommendations may be updated based on patient status, additional functional criteria and insurance authorization.  Follow up Recommendations  (tba)      Assistance Recommended at Discharge Frequent or constant Supervision/Assistance  Functional Status Assessment Patient has had a recent decline in their functional status and demonstrates the ability to make significant improvements in function in a reasonable and predictable amount of time.  Frequency and Duration min 2x/week  2 weeks       Prognosis Prognosis for Safe Diet Advancement: Fair Barriers to Reach Goals: Cognitive deficits;Other (Comment) (overall medical issues)      Swallow Study   General HPI: Pt is an 88 yo male presenting from his PCP's office with generalized weakness, hypotension, and rapid, unintentional weight loss. Pt found to have pancreatic mass, acute PE, and PNA. Per GI note, daughter has noticed "when he drinks water or eats food he spits it back up." PMH includes: dementia, EGD with dilation, HTN, HLD, CAD, CHF, DMII, afib, AAA, remote gastric ulcer, S/P Bilroth I and II. Type of Study: Bedside Swallow Evaluation Previous Swallow Assessment: none in chart Diet Prior to this Study: NPO Temperature Spikes Noted: No Respiratory Status: Room air History of Recent Intubation: No Behavior/Cognition: Alert;Requires cueing Oral Cavity  Assessment: Within Functional Limits Oral Care Completed by SLP: No Oral Cavity - Dentition: Edentulous Vision: Functional for self-feeding Self-Feeding Abilities: Needs assist Patient Positioning: Upright in bed Baseline Vocal Quality: Normal;Other (comment) (? some hypernasality but family denies any acute changes to voice/speech) Volitional Cough: Cognitively unable to elicit Volitional Swallow: Unable to elicit    Oral/Motor/Sensory Function Overall Oral Motor/Sensory Function:  (cognitively not able to assess)   Ice Chips Ice chips: Not tested   Thin Liquid Thin Liquid: Impaired Presentation: Cup;Self Fed;Straw Oral Phase Functional Implications: Prolonged oral transit Pharyngeal  Phase Impairments: Multiple swallows;Cough - Immediate    Nectar Thick Nectar Thick Liquid: Not tested   Honey Thick Honey Thick Liquid: Not tested   Puree Puree: Impaired Presentation: Spoon Pharyngeal Phase Impairments: Multiple swallows   Solid     Solid: Not tested      Osie Bond., M.A. [place] Office [phone]  Secure chat preferred  05/08/2022,4:36 PM

## 2022-05-08 NOTE — Progress Notes (Addendum)
ANTICOAGULATION CONSULT NOTE - Follow Up Consult  Pharmacy Consult for Heparin Indication: pulmonary embolus  Allergies  Allergen Reactions   Asa W/Codeine [Aspirin-Codeine] Other (See Comments)    Irritates his water system (urination burning)    Patient Measurements: Heparin Dosing Weight: 51 kg  Vital Signs: Temp: 97.5 F (36.4 C) (07/15 0402) Temp Source: Oral (07/15 0402) BP: 109/81 (07/15 0402) Pulse Rate: 106 (07/15 0402)  Labs: Recent Labs    05/06/22 1339 05/06/22 1715 05/06/22 2005 05/07/22 0519 05/07/22 1713 05/08/22 0419  HGB 13.2  --   --  12.0*  --  12.4*  HCT 42.5  --   --  36.2*  --  36.9*  PLT 147*  --   --  148*  --  162  APTT  --   --   --  53*  --   --   LABPROT  --   --  17.7* 17.5*  --   --   INR  --   --  1.5* 1.5*  --   --   HEPARINUNFRC  --   --   --  <0.10* 0.19* 0.57  CREATININE 1.06  --   --  1.02  --  0.84  TROPONINIHS  --  14 12  --   --   --     CrCl cannot be calculated (Unknown ideal weight.).   Medications:  Scheduled:   dextrose  12.5 g Intravenous STAT   insulin aspart  0-9 Units Subcutaneous TID AC & HS   metoprolol tartrate  25 mg Oral BID   tamsulosin  0.4 mg Oral Daily   trimethoprim-polymyxin b  1 drop Both Eyes Q6H   Infusions:   azithromycin 500 mg (05/08/22 0630)   cefTRIAXone (ROCEPHIN)  IV 2 g (05/08/22 0546)   dextrose 5 % and 0.9% NaCl Stopped (05/08/22 0546)   heparin 1,150 Units/hr (05/08/22 0546)    Assessment: 88 yo M with a history of A-fib, CAD status post MI with DES to the circumflex, T2DM, HTN, HLD, PAD. Patient is presenting with weakness. CTA PE w/ PE in distal rt main pulmonary artery extending into lobar and segmental branches of RLL. Patient is not on anticoagulation prior to arrival. Pharmacy consulted for heparin dosing.  Heparin level today is therapeutic at 0.57, on 1150 units/hr. Hgb 12.4, plt 162. Patient was previously noted to have some hematuria yesterday; now with dark amber colored  urine per RN. No line issues or signs/symptoms of bleeding noted per RN.   Goal of Therapy:  Heparin level 0.3-0.7 units/ml Monitor platelets by anticoagulation protocol: Yes   Plan:  Continue IV heparin at 1150 units/hr. Check ~8 hr heparin level.  Daily CBC, heparin level. Monitor for signs/symptoms of bleeding.   ADDENDUM: ~8 hr heparin level is supratherapeutic at 0.81, on 1150 units/hr. CBC stable. RN reports patient is still producing darker, amber colored urine. Will decrease IV heparin infusion rate to 1050 units/hr and check another ~8 hr heparin level tonight.    Vance Peper, PharmD PGY-2 Pharmacy Resident Phone [phone] 05/08/2022 7:04 AM   Please check AMION for all [place] phone numbers After 10:00 PM, call [place] [phone]

## 2022-05-08 NOTE — Consult Note (Signed)
Consultation Note Date: 05/08/2022   Patient Name: Robert [name]  DOB: Mar 05, 1932  MRN: [id number]  Age / Sex: 88 y.o., male  PCP: Lavone Orn, MD Referring Physician: Dessa Phi, DO  Reason for Consultation: Establishing goals of care, "Newly dx pancreatic mass, further work up in progress. Establish rapport and discuss goals of care with pt and family"  HPI/Patient Profile: 88 y.o. male  with past medical history of dementia, hypertension, hyperlipidemia, coronary artery disease (multiple remote MI's s/p cath in 1985/12/27 and cath in 27-Dec-2002 s/p DES to circumflex), diastolic congestive heart failure (echo 05/07/22 EF 30-35%), chronic atrial fibrillation and flutter (on Plavix), diabetes mellitus type 2, abdominal aortic aneurysm presented to ED on 05/06/2022 from PCP office for evaluation of hypotension, generalized weakness, and poor appetite.  Per family, patient has exhibited progressive generalized weakness over the past 3 weeks, significant weight loss of about 25 pounds, with poor appetite.  Patient was admitted on 05/06/2022 with pancreatic mass with significant biliary obstruction, acute pulmonary embolism, pneumonia of the right middle lobe, thymus hyperplasia, nodule of upper lobe of right lung, failure to thrive.    Clinical Assessment and Goals of Care: I have reviewed medical records including EPIC notes, labs, and imaging. Received report from primary RN - no acute concerns.  RN reports patient has had difficulty swallowing - now NPO with SLP pending.  RN reports patient is too weak to stand, very lethargic, oriented to self only.  Went to visit patient at bedside -daughter/Valerie present. Patient was lying in bed awake, alert, oriented to self only, and able to answer very simple questions only.  His speech is mumbled and almost unintelligible.  No signs or non-verbal gestures of pain or discomfort noted. No  respiratory distress, increased work of breathing, or secretions noted.  Patient denies pain.  He is very ill and frail appearing.  Per daughter patient is hard of hearing.  Patient is not able to make complex medical decisions.  Met with patient and daughter/Valerie to discuss diagnosis, prognosis, GOC, EOL wishes, disposition, and options.  Patient was asleep for the majority of visit today.  I introduced Palliative Medicine as specialized medical care for people living with serious illness. It focuses on providing relief from the symptoms and stress of a serious illness. The goal is to improve quality of life for both the patient and the family.  We discussed a brief life review of the patient as well as functional and nutritional status.  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  He loves to travel, motorcycles, was very active per daughter until he decided to stop driving at 88 years old.  Patient's wife passed away in December 28, 1991 -patient has 5 children.  Prior to hospitalization, patient was living in a private residence with his daughter/Veronica and her 2 children.  Mateo Flow tells me that patient was independent up until last Wednesday when he became too weak to walk or do ADLs for himself.  Patient does not have home health services.  Mateo Flow tells me that as of Father's Day patient was walking into restaurants and eating well; however, as of 3 weeks ago patient has not been eating much, having trouble swallowing, and has had progressive weakness.  Albumin noted at 2.0 on 05/08/2022.  We discussed patient's current illness and what it means in the larger context of patient's on-going co-morbidities.  Education provided that dementia and CHF are progressive, non-curable disease underlying the patient's current  acute medical conditions. Mateo Flow has a clear understanding of patient's current acute medical situation. Medications reviewed. Natural disease trajectory  and expectations at EOL were discussed. I attempted to elicit values and goals of care important to the patient. The difference between aggressive medical intervention and comfort care was considered in light of the patient's goals of care. Mateo Flow tells me that family have been discussing patient's situation and they are "hoping for the best, but preparing for the worst." Family understand patient is very frail with poor prognosis. At this time, family would like to continue work up for additional information and will make stepwise decisions. Offered to have family meeting for [place] pending clinical course - Mateo Flow felt this was best and would like her sister present. Family meeting scheduled for tomorrow - family to call with time.   Advance directives and rehospitalization were considered and discussed. Patient does have a Living Will and HCPOA - requested copies. Per Mateo Flow, she and her sister/Veronica are both HCPOA. Will have family meeting tomorrow.   Discussed with patient/family the importance of continued conversation with each other and the medical providers regarding overall plan of care and treatment options, ensuring decisions are within the context of the patient's values and GOCs.    Questions and concerns were addressed. The patient/family was encouraged to call with questions and/or concerns. PMT card was provided.   Primary Decision Maker: HCPOA - daughters Olson Lucarelli and Scarlett Presto. Do not have copies of HCPOA document - requested.    SUMMARY OF RECOMMENDATIONS   Continue current medical treatment with watchful waiting Continue pancreatic mass work up - family will make stepwise decisions based on results/information. They understand patient is very frail with likely poor prognosis Continue DNR/DNI as previously documented Family meeting scheduled for tomorrow 7/16 - family to call PMT with time Ongoing GOC pending clinical course PMT will continue to follow  and support holistically   Code Status/Advance Care Planning: DNR  Palliative Prophylaxis:  Aspiration, Bowel Regimen, Delirium Protocol, Frequent Pain Assessment, Oral Care, and Turn Reposition  Additional Recommendations (Limitations, Scope, Preferences): Full Scope Treatment and No Tracheostomy  Psycho-social/Spiritual:  Desire for further Chaplaincy support:no Created space and opportunity for patient and family to express thoughts and feelings regarding patient's current medical situation.  Emotional support and therapeutic listening provided.  Prognosis:  Poor  Discharge Planning: To Be Determined      Primary Diagnoses: Present on Admission:  Pancreatic mass  Acute pulmonary embolism (HCC)  Pneumonia of right middle lobe due to infectious organism  BPH (benign prostatic hyperplasia)  Coronary artery disease involving native coronary artery of native heart without angina pectoris  Essential hypertension  Mixed hyperlipidemia due to type 2 diabetes mellitus (HCC)  Chronic diastolic CHF (congestive heart failure) (HCC)  Thymus hyperplasia (HCC)  Nodule of upper lobe of right lung  Bacterial conjunctivitis of both eyes   I have reviewed the medical record, interviewed the patient and family, and examined the patient. The following aspects are pertinent.  Past Medical History:  Diagnosis Date   Atrial fibrillation ([place])    CAD (coronary artery disease)    DES to Cfx   Diabetes mellitus without complication ([place])    Fall at home 06/15/2018   History of nuclear stress test 02/2012   lexiscan; mild-mod perfusion defect in basal inferoseptal, mid inferoseptal apical anterior; no change from previous study, abnormal test    Hyperlipidemia    Hypertension    Myocardial infarction ([place]) 1980s   PAD (peripheral  artery disease) ([place])    aortic stent graft 2008   PUD (peptic ulcer disease)    Small bowel obstruction ([place]) 10/2011   Ulcer    Social History    Socioeconomic History   Marital status: Widowed    Spouse name: Not on file   Number of children: Not on file   Years of education: Not on file   Highest education level: Not on file  Occupational History   Not on file  Tobacco Use   Smoking status: Former    Years: 0.50    Types: Cigarettes    Quit date: 07/18/1992    Years since quitting: 29.8   Smokeless tobacco: Never   Tobacco comments:    quit around HA time  Vaping Use   Vaping Use: Never used  Substance and Sexual Activity   Alcohol use: No   Drug use: No   Sexual activity: Not on file  Other Topics Concern   Not on file  Social History Narrative   Used to be a Psychologist, 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   Social Determinants of Health   Financial Resource Strain: Not on file  Food Insecurity: Not on file  Transportation Needs: Not on file  Physical Activity: Not on file  Stress: Not on file  Social Connections: Not on file   Family History  Problem Relation Age of Onset   Stroke Father    Heart disease Father    Hypertension Father    Heart disease Daughter        Before age 77   Diabetes Sister        DVT-Blood clots in vein   Scheduled Meds:  dextrose  12.5 g Intravenous STAT   insulin aspart  0-9 Units Subcutaneous TID AC & HS   metoprolol tartrate  25 mg Oral BID   tamsulosin  0.4 mg Oral Daily   trimethoprim-polymyxin b  1 drop Both Eyes Q6H   Continuous Infusions:  azithromycin 500 mg (05/08/22 0630)   cefTRIAXone (ROCEPHIN)  IV 2 g (05/08/22 0546)   dextrose 5 % and 0.9% NaCl Stopped (05/08/22 0546)   heparin 1,150 Units/hr (05/08/22 0546)   PRN Meds:.albuterol, hydrALAZINE, ondansetron **OR** ondansetron (ZOFRAN) IV, polyethylene glycol Medications Prior to Admission:  Prior to Admission medications   Medication Sig Start Date End Date Taking? Authorizing Provider  atorvastatin (LIPITOR) 10 MG tablet Take 1 tablet by mouth daily.   Yes [provider]  Ferrous  Sulfate (IRON) 325 (65 Fe) MG TABS Take 1 tablet by mouth daily. 12/31/20  Yes [provider]  furosemide (LASIX) 40 MG tablet TAKE 1 TABLET(40 MG) BY MOUTH DAILY Patient taking differently: Take 40 mg by mouth daily. 10/20/21  Yes Hilty, Nadean Corwin, MD  lisinopril (ZESTRIL) 20 MG tablet TAKE 1 TABLET(20 MG) BY MOUTH DAILY 07/09/21  Yes Hilty, Nadean Corwin, MD  metoprolol tartrate (LOPRESSOR) 25 MG tablet Take 25 mg by mouth 2 (two) times daily. 12/25/20  Yes [provider]  potassium chloride (KLOR-CON) 10 MEQ tablet Take 1 tablet by mouth daily.   Yes [provider]  tamsulosin (FLOMAX) 0.4 MG CAPS capsule Take 1 capsule by mouth daily. 04/04/19  Yes [provider]  clopidogrel (PLAVIX) 75 MG tablet TAKE 1 TABLET(75 MG) BY MOUTH DAILY Patient not taking: Reported on 05/06/2022 06/08/21   Pixie Casino, MD   Allergies  Allergen Reactions   Diona Fanti W/Codeine [Aspirin-Codeine] Other (See Comments)    Irritates his water  system (urination burning)   Review of Systems  Constitutional:  Positive for activity change and fatigue.  Respiratory:  Negative for shortness of breath.   Gastrointestinal:  Negative for nausea and vomiting.  Neurological:  Positive for weakness.  All other systems reviewed and are negative.   Physical Exam Vitals and nursing note reviewed.  Constitutional:      General: He is not in acute distress.    Appearance: He is cachectic. He is ill-appearing.  Pulmonary:     Effort: No respiratory distress.  Skin:    General: Skin is warm and dry.  Neurological:     Mental Status: He is lethargic, disoriented and confused.     Motor: Weakness present.  Psychiatric:        Attention and Perception: Attention normal.        Speech: Speech is slurred.        Behavior: Behavior is cooperative.        Cognition and Memory: Cognition is impaired. Memory is impaired.     Vital Signs: BP 105/76 (BP Location: Right Arm)   Pulse (!) 115   Temp  (!) 97.5 F (36.4 C) (Oral)   Resp 18   Ht 5' 11"  (1.803 m)   Wt 52 kg   SpO2 100%   BMI 15.99 kg/m  Pain Scale: 0-10 POSS *See Group Information*: S-Acceptable,Sleep, easy to arouse Pain Score: Asleep   SpO2: SpO2: 100 % O2 Device:SpO2: 100 % O2 Flow Rate: .   IO: Intake/output summary:  Intake/Output Summary (Last 24 hours) at 05/08/2022 1046 Last data filed at 05/08/2022 0546 Gross per 24 hour  Intake 1534.3 ml  Output 600 ml  Net 934.3 ml    LBM: Last BM Date : 05/05/22 Baseline Weight: Weight: 52 kg Most recent weight: Weight: 52 kg     Palliative Assessment/Data: PPS 10% due to NPO status     Time In: 1045 Time Out: 1215 Time Total: 90 minutes  Greater than 50%  of this time was spent counseling and coordinating care related to the above assessment and plan.  Signed by: Lin Landsman, NP   Please contact Palliative Medicine Team phone at [phone] for questions and concerns.  For individual provider: See Amion  *Portions of this note are a verbal dictation therefore any spelling and/or grammatical errors are due to the "[employer]" system interpretation.

## 2022-05-08 NOTE — Progress Notes (Signed)
PROGRESS NOTE    Robert [name]  ZOX:[id number] DOB: 11/20/1931 DOA: 05/06/2022 PCP: Lavone Orn, MD     Brief Narrative:  88 year old male with past medical history of dementia, hypertension, hyperlipidemia, coronary artery disease (multiple remote MI's S/P cath in 1987 and cath in 2004 S/P DES to circumflex), diastolic congestive heart failure (Echo 08/2017 EF 55-60% with G1DD), non-insulin-dependent diabetes mellitus type 2, chronic atrial fibrillation and flutter (not on anticoagulation), diabetes mellitus type 2, abdominal aortic aneurysm who presents to [facility] emergency department from his primary care provider's office for evaluation of generalized weakness and hypotension. Also rapid unintentional weight loss.   CT of the chest abdomen and pelvis was obtained finding multiple abnormalities including pulmonary emboli in the distal right main pulmonary artery extending into the right lower and middle lobe without heart strain.  Patient additionally found to have groundglass opacities in the right middle lobe concerning for infection.  Additionally patient was identified to have marked intra and extrahepatic biliary ductal dilatation with obliteration of the extrahepatic bile duct as well as interval development of an marked dilatation of the main pancreatic duct. MRCP showed dilation and pancreatic resulting in obstruction of the distal common bile duct and pancreatic ducts with severe proximal ductal dilation.  Small peripancreatic lymph nodes suspicious for potential nodal metastasis.  Biliary sludge. Normal LFTs with PCP 12/2021. GI consulted.   New events last 24 hours / Subjective: Patient resting comfortably, arousable to voice but mumbled incoherently to question asked.  Daughter is at bedside.  States no acute events overnight, was able to get to edge of bed with assistance with mobility technician.  Assessment & Plan:  Principal Problem:   Pancreatic mass Active  Problems:   Acute pulmonary embolism (HCC)   Pneumonia of right middle lobe due to infectious organism   Thymus hyperplasia (HCC)   Nodule of upper lobe of right lung   Coronary artery disease involving native coronary artery of native heart without angina pectoris   Type 2 diabetes mellitus without complication, without long-term current use of insulin (HCC)   Chronic diastolic CHF (congestive heart failure) (HCC)   Essential hypertension   Mixed hyperlipidemia due to type 2 diabetes mellitus (HCC)   Bacterial conjunctivitis of both eyes   BPH (benign prostatic hyperplasia)   Pressure injury of skin   Pancreatic mass, with biliary duct dilation -GI consulted -Reevaluate on Monday to discuss possible timing of attempts at EUS and ERCP -Palliative care medicine following, planning for family meeting tomorrow  Acute PE -IV heparin  Chronic systolic CHF -EF 81% with global hypokinesis seen on echocardiogram after PE -Troponin was negative on admission -Does not appear to be fluid overloaded  Community-acquired pneumonia -Rocephin, azithromycin  Thymus hyperplasia  -Notable hyperplasia and possible soft tissue nodule of the thymus  Nodule of upper lobe of right lung -Unclear if related to underlying malignant process/mets vs second primary malignancy  -Hx of smoking, quit when he was 88 yo   Failure to thrive, poor oral intake -Likely secondary to pancreatic mass as above -Dietitian consulted  Dysphagia -SLP evaluation pending -IV fluid while n.p.o.  CAD -Plavix on hold due to pending procedure  -Lipitor on hold due to elevated LFT  DM type 2 -A1c 7.4 -SSI  HTN -Metoprolol   Bacterial conjunctivitis of both eyes -Polytrim eyedrops for 7 days  BPH -Flomax   Hypokalemia -Replace   DVT prophylaxis:  SCDs Start: 05/06/22 2301  Code Status: DNR Family Communication: Daughter at bedside.  Patient is widowed.  Has 5 children. Disposition Plan:  Status is:  Inpatient Remains inpatient appropriate because: Pending further work up  Consultants:  GI PMT   Antimicrobials:  Anti-infectives (From admission, onward)    Start     Dose/Rate Route Frequency Ordered Stop   05/07/22 0630  cefTRIAXone (ROCEPHIN) 2 g in sodium chloride 0.9 % 100 mL IVPB        2 g 200 mL/hr over 30 Minutes Intravenous Every 24 hours 05/07/22 0621     05/07/22 0630  azithromycin (ZITHROMAX) 500 mg in sodium chloride 0.9 % 250 mL IVPB        500 mg 250 mL/hr over 60 Minutes Intravenous Every 24 hours 05/07/22 0621          Objective: Vitals:   05/08/22 0402 05/08/22 0700 05/08/22 0840 05/08/22 1218  BP: 109/81  105/76 107/82  Pulse: (!) 106  (!) 115 (!) 101  Resp: '16  18 20  '$ Temp: (!) 97.5 F (36.4 C)     TempSrc: Oral     SpO2: 92%  100% 98%  Weight:  52 kg    Height:  '5\' 11"'$  (1.803 m)      Intake/Output Summary (Last 24 hours) at 05/08/2022 1359 Last data filed at 05/08/2022 1109 Gross per 24 hour  Intake 1534.3 ml  Output 600 ml  Net 934.3 ml   Filed Weights   05/08/22 0700  Weight: 52 kg    Examination:  General exam: Appears calm and comfortable Respiratory system: Clear to auscultation. Respiratory effort normal. No respiratory distress. Cardiovascular system: S1 & S2 heard, no peripheral edema Gastrointestinal system: Abdomen is nondistended, soft  Extremities: Symmetric in appearance  Skin: No rashes, lesions or ulcers on exposed skin   Data Reviewed: I have personally reviewed following labs and imaging studies  CBC: Recent Labs  Lab 05/06/22 1339 05/07/22 0519 05/08/22 0419  WBC 5.4 4.8 4.6  NEUTROABS  --  4.1  --   HGB 13.2 12.0* 12.4*  HCT 42.5 36.2* 36.9*  MCV 98.6 92.1 92.3  PLT 147* 148* 790    Basic Metabolic Panel: Recent Labs  Lab 05/06/22 1339 05/07/22 0519 05/08/22 0419 05/08/22 1235  NA 138 137 140  --   K 3.6 3.1* 3.4* 3.7  CL 106 105 107  --   CO2 17* 23 21*  --   GLUCOSE 123* 99 117*  --   BUN '17  16 13  '$ --   CREATININE 1.06 1.02 0.84  --   CALCIUM 8.5* 8.2* 8.2*  --   MG  --  2.3  --   --     GFR: Estimated Creatinine Clearance: 44.7 mL/min (by C-G formula based on SCr of 0.84 mg/dL). Liver Function Tests: Recent Labs  Lab 05/06/22 1715 05/07/22 0519 05/08/22 0419  AST 82* 67* 64*  ALT 74* 64* 63*  ALKPHOS 440* 383* 345*  BILITOT 11.9* 10.8* 9.7*  PROT 6.5 6.0* 5.9*  ALBUMIN 2.2* 2.2* 2.0*    No results for input(s): "LIPASE", "AMYLASE" in the last 168 hours. Recent Labs  Lab 05/07/22 0519  AMMONIA 23    Coagulation Profile: Recent Labs  Lab 05/06/22 2005 05/07/22 0519  INR 1.5* 1.5*    Cardiac Enzymes: No results for input(s): "CKTOTAL", "CKMB", "CKMBINDEX", "TROPONINI" in the last 168 hours. BNP (last 3 results) No results for input(s): "PROBNP" in the last 8760 hours. HbA1C: Recent Labs    05/07/22 0519  HGBA1C 7.4*  CBG: Recent Labs  Lab 05/08/22 0003 05/08/22 0148 05/08/22 0403 05/08/22 0907 05/08/22 1149  GLUCAP 104* 96 93 109* 111*   Lipid Profile: No results for input(s): "CHOL", "HDL", "LDLCALC", "TRIG", "CHOLHDL", "LDLDIRECT" in the last 72 hours. Thyroid Function Tests: No results for input(s): "TSH", "T4TOTAL", "FREET4", "T3FREE", "THYROIDAB" in the last 72 hours. Anemia Panel: No results for input(s): "VITAMINB12", "FOLATE", "FERRITIN", "TIBC", "IRON", "RETICCTPCT" in the last 72 hours. Sepsis Labs: No results for input(s): "PROCALCITON", "LATICACIDVEN" in the last 168 hours.  Recent Results (from the past 240 hour(s))  Culture, blood (Routine X 2) w Reflex to ID Panel     Status: None (Preliminary result)   Collection Time: 05/07/22  6:20 AM   Specimen: BLOOD RIGHT HAND  Result Value Ref Range Status   Specimen Description BLOOD RIGHT HAND  Final   Special Requests   Final    AEROBIC BOTTLE ONLY Blood Culture results may not be optimal due to an inadequate volume of blood received in culture bottles   Culture   Final     NO GROWTH < 24 HOURS Performed at [facility], [place] [address], [place], [place] [address]    Report Status PENDING  Incomplete  Culture, blood (Routine X 2) w Reflex to ID Panel     Status: None (Preliminary result)   Collection Time: 05/07/22  6:25 AM   Specimen: BLOOD LEFT HAND  Result Value Ref Range Status   Specimen Description BLOOD LEFT HAND  Final   Special Requests   Final    AEROBIC BOTTLE ONLY Blood Culture results may not be optimal due to an inadequate volume of blood received in culture bottles   Culture   Final    NO GROWTH < 24 HOURS Performed at [facility], [place] [address] [place], [place] [address]    Report Status PENDING  Incomplete      Radiology Studies: ECHOCARDIOGRAM COMPLETE  Result Date: 05/07/2022    ECHOCARDIOGRAM REPORT   Patient Name:   BURHANUDDIN KOHLMANN Date of Exam: 05/07/2022 Medical Rec #:  [id number]      Height:       71.0 in Accession #:    [id number]     Weight:       129.8 lb Date of Birth:  [birth year]      BSA:          1.755 m Patient Age:    62 years       BP:           128/75 mmHg Patient Gender: M              HR:           122 bpm. Exam Location:  Inpatient Procedure: 2D Echo, Color Doppler and Cardiac Doppler Indications:    Pulmonary embolus  History:        Patient has prior history of Echocardiogram examinations, most                 recent 09/01/2017. Signs/Symptoms:Altered Mental Status.  Sonographer:    Merrie Roof RDCS Referring Phys: [id number] New Site  Sonographer Comments: Technically difficult study due to poor echo windows, suboptimal parasternal window, suboptimal apical window and suboptimal subcostal window. IMPRESSIONS  1. Left ventricular ejection fraction, by estimation, is 30 to 35%. The left ventricle has moderately decreased function. The left ventricle demonstrates global hypokinesis. Left ventricular diastolic parameters are indeterminate.  2. Right ventricular systolic function is  normal. The right  ventricular size is normal.  3. The mitral valve is normal in structure. Moderate to severe mitral valve regurgitation. No evidence of mitral stenosis.  4. The aortic valve is tricuspid. There is mild calcification of the aortic valve. There is mild thickening of the aortic valve. Aortic valve regurgitation is moderate. Aortic valve sclerosis/calcification is present, without any evidence of aortic stenosis.  5. The inferior vena cava is normal in size with greater than 50% respiratory variability, suggesting right atrial pressure of 3 mmHg. Comparison(s): Prior images reviewed side by side. The left ventricular function is worsened. FINDINGS  Left Ventricle: Left ventricular ejection fraction, by estimation, is 30 to 35%. The left ventricle has moderately decreased function. The left ventricle demonstrates global hypokinesis. The left ventricular internal cavity size was normal in size. There is no left ventricular hypertrophy. Left ventricular diastolic parameters are indeterminate. Right Ventricle: The right ventricular size is normal. No increase in right ventricular wall thickness. Right ventricular systolic function is normal. Left Atrium: Left atrial size was normal in size. Right Atrium: Right atrial size was normal in size. Pericardium: There is no evidence of pericardial effusion. Mitral Valve: The mitral valve is normal in structure. Moderate to severe mitral valve regurgitation. No evidence of mitral valve stenosis. Tricuspid Valve: The tricuspid valve is normal in structure. Tricuspid valve regurgitation is not demonstrated. No evidence of tricuspid stenosis. Aortic Valve: The aortic valve is tricuspid. There is mild calcification of the aortic valve. There is mild thickening of the aortic valve. Aortic valve regurgitation is moderate. Aortic valve sclerosis/calcification is present, without any evidence of aortic stenosis. Pulmonic Valve: The pulmonic valve was normal in structure. Pulmonic valve  regurgitation is mild. No evidence of pulmonic stenosis. Aorta: The aortic root is normal in size and structure. Venous: The inferior vena cava is normal in size with greater than 50% respiratory variability, suggesting right atrial pressure of 3 mmHg. IAS/Shunts: No atrial level shunt detected by color flow Doppler.  LEFT VENTRICLE PLAX 2D LVIDd:         5.10 cm LV PW:         0.90 cm LV IVS:        1.00 cm LVOT diam:     2.20 cm LVOT Area:     3.80 cm  LEFT ATRIUM         Index LA diam:    4.10 cm 2.34 cm/m   AORTA Ao Root diam: 3.80 cm  SHUNTS Systemic Diam: 2.20 cm Candee Furbish MD Electronically signed by Candee Furbish MD Signature Date/Time: 05/07/2022/4:07:44 PM    Final    MR ABDOMEN MRCP W WO CONTAST  Result Date: 05/07/2022 CLINICAL DATA:  88 year old male with history of jaundice. EXAM: MRI ABDOMEN WITHOUT AND WITH CONTRAST (INCLUDING MRCP) TECHNIQUE: Multiplanar multisequence MR imaging of the abdomen was performed both before and after the administration of intravenous contrast. Heavily T2-weighted images of the biliary and pancreatic ducts were obtained, and three-dimensional MRCP images were rendered by post processing. CONTRAST:  50m GADAVIST GADOBUTROL 1 MMOL/ML IV SOLN COMPARISON:  No prior abdominal MRI. CT the abdomen and pelvis 05/06/2022. FINDINGS: Comment: Today's study is exceedingly limited by extensive patient respiratory motion and suboptimal contrast bolus (the majority of the postcontrast images demonstrate no intravascular gadolinium). Lower chest: Increased susceptibility artifact in the dependent portions of the lower thorax bilaterally corresponding to extensive bullous disease in this region on recent chest CT. Small right pleural effusion. Hepatobiliary: Severely limited assessment secondary  to suboptimal contrast bolus and patient respiratory motion. With these limitations in mind, there is severe intrahepatic biliary ductal dilatation and diffuse periportal edema. Several  well-defined T1 hypointense, T2 hyperintense, nonenhancing lesions scattered throughout the liver correspond to multiple cysts noted on prior CT examinations, measuring up to 3.3 x 2.8 cm in segment 8 (axial image 6 of series 4). No clearly aggressive intrahepatic lesion confidently identified within the limitations of today's examination. Gallbladder is severely distended. In the dependent portion of the gallbladder there is some T1 hyperintense, T2 hypointense amorphous material lying dependently, most likely to represent biliary sludge. Gallbladder wall thickness is upper limits of normal at 3 mm. Trace amount of pericholecystic fluid, nonspecific in the setting of trace volume of ascites. Cystic duct is dilated. Shortly after the confluence of the common hepatic duct and the cystic duct there is some focal narrowing of the proximal common bile duct, which appears to demonstrate some peripheral enhancement best appreciated on axial image 47 of series 19. Slightly more distal to this best appreciated on coronal MRCP image 29 of series 11 there is irregularity in and around the common bile duct, resulting in complete obliteration of the lumen. Whether this is secondary to extrinsic or intrinsic obstruction is uncertain on today's limited study. Pancreas: Severe dilatation of the main pancreatic duct and many pancreatic ductal side branches, with the main pancreatic duct measuring up to 1.8 cm in the neck of the pancreas. Multiple cystic appearing regions in the head of the pancreas appear to predominantly represent dilated side branches, however, the pancreatic head appears enlarged relative to the rest of the pancreas which is otherwise severely atrophic, and in the region of the common bile duct obliteration in the pancreatic head (axial image 57 of series 19) this mass-like appearance has multiple internal cystic regions and relatively increased parenchymal enhancement, estimated to measure approximately 5.3 x  4.9 cm. Whether there is truly a lesion in this region or not is uncertain on today's limited study, however, the coincident obliteration of the common bile duct and main pancreatic duct in these regions, with severe proximal dilatation raises concern for neoplasm. Spleen:  Unremarkable. Adrenals/Urinary Tract: Multiple T1 hypointense, T2 hyperintense, nonenhancing lesions in both kidneys, compatible with simple cysts, largest of which is exophytic in the lateral aspect of the interpolar region of the left kidney measuring up to 7.3 cm in diameter. No hydroureteronephrosis in the visualized portions of the abdomen. Bilateral adrenal glands are normal in appearance. Stomach/Bowel: Visualized portions are unremarkable. Vascular/Lymphatic: Aorto bi-iliac stent graft noted, apparently patent on today's limited examination. Multiple prominent borderline enlarged peripancreatic lymph nodes adjacent to the pancreatic head, measuring up to 9 mm (axial image 54 of series 19). Other:  Trace volume of ascites. Musculoskeletal: No aggressive appearing osseous lesions are noted in the visualized portions of the skeleton. IMPRESSION: 1. Severely limited examination which is highly suspicious for a complex cystic and solid lesion in the pancreatic head resulting in obstruction of the distal common bile duct and pancreatic ducts, with severe proximal ductal dilatation. Small peripancreatic lymph nodes suspicious for potential nodal metastasis. Further evaluation with endoscopic ultrasound and/or ERCP is recommended to better evaluate these findings. 2. Biliary sludge lying dependently in the gallbladder. Gallbladder is severely distended. Gallbladder wall thickness appears upper limits of normal, and there is a trace volume of pericholecystic fluid (assessment is limited by presence of small volume of ascites) such that the possibility of early acute cholecystitis is not excluded. 3. Severe bullous  emphysema in the lung bases.  Clinical correlation for evidence of alpha-1 antitrypsin deficiency is recommended. 4. Additional incidental findings, as above. Electronically Signed   By: Vinnie Langton M.D.   On: 05/07/2022 05:48   CT ABDOMEN PELVIS W CONTRAST  Result Date: 05/06/2022 CLINICAL DATA:  Pulmonary nodule, metastatic disease EXAM: CT ABDOMEN AND PELVIS WITH CONTRAST TECHNIQUE: Multidetector CT imaging of the abdomen and pelvis was performed using the standard protocol following bolus administration of intravenous contrast. RADIATION DOSE REDUCTION: This exam was performed according to the departmental dose-optimization program which includes automated exposure control, adjustment of the mA and/or kV according to patient size and/or use of iterative reconstruction technique. CONTRAST:  64m OMNIPAQUE IOHEXOL 300 MG/ML  SOLN COMPARISON:  04/01/2021 FINDINGS: Lower chest: Prominent bulla are noted within the lung bases bilaterally better assessed on previously performed chest CT at 6:20 p.m. Patchy ground-glass pulmonary infiltrates are noted within the visualized lung bases, possibly infectious or inflammatory in nature. Extensive multi-vessel coronary artery calcification. Cardiac size is within normal limits. Hepatobiliary: There is interval development of marked intra and extrahepatic biliary ductal dilation to the level of the ampulla where the extrahepatic bile duct is obliterated, best seen on image # 37/3 and 63/5. The gallbladder is markedly distended in keeping with an obstructive biliary process. Multiple hepatic cysts are again identified. No enhancing intrahepatic mass identified. Pancreas: The main pancreatic duct is markedly dilated, new since prior examination. The duct also appears occluded in the region of the ampulla, best seen on axial image # 39/3 and 61/5. Multiple varicoid cystic lesions in this region of the pancreatic head may represent dilated pancreatic side branches. A discrete mass is not clearly  identified on this limited examination, however. Spleen: Unremarkable Adrenals/Urinary Tract: The adrenal glands are unremarkable. The kidneys are normal in size and position. Multiple simple cortical cysts are seen within the kidneys bilaterally. No further follow-up is recommended for these lesions there is no hydronephrosis. Contrast from prior chest CT examination is seen within the renal collecting system. The bladder is unremarkable. Stomach/Bowel: Evaluation of the bowel is markedly limited by lack of intra-abdominal fat, diffuse anasarca, and phase of contrast enhancement. The large and small bowel are grossly unremarkable. No evidence of obstruction. No free intraperitoneal gas or fluid. Vascular/Lymphatic: Abdominal aortic aneurysm pair utilizing a bifurcated stent graft is again identified. No residual abdominal aortic aneurysm. Extensive aortoiliac atherosclerotic calcification. Evaluation of the retroperitoneal soft tissues is markedly limited by extensive retroperitoneal edema though no definite pathologic adenopathy is identified. Reproductive: The prostate gland is unremarkable. Other: There is diffuse body wall wasting and edema in keeping with anasarca. Musculoskeletal: No acute bone abnormality. Degenerative changes are seen within the lumbar spine. Left total hip arthroplasty has been performed. IMPRESSION: 1. Interval development of marked intra and extrahepatic biliary ductal dilation to the level of the ampulla where the extrahepatic bile duct is obliterated. Interval development of marked dilation of the main pancreatic duct. Multiple varicoid cystic lesions in the region of the pancreatic head may represent dilated pancreatic side branches. A discrete mass is not clearly identified on this limited examination. Correlation with EUS/ERCP or MRI/MRCP examination may be helpful for further evaluation. 2. Patchy ground-glass pulmonary infiltrates within the visualized lung bases, possibly  infectious or inflammatory in nature. 3. Diffuse body wall wasting and edema in keeping with anasarca. Extensive body wall and retroperitoneal edema limits evaluation of the a intraperitoneal and retroperitoneal structures. 4. Extensive multi-vessel coronary artery calcification. 5. Abdominal aortic aneurysm  utilizing a bifurcated stent graft. No residual abdominal aortic aneurysm. Electronically Signed   By: Fidela Salisbury M.D.   On: 05/06/2022 21:09   CT Angio Chest PE W and/or Wo Contrast  Addendum Date: 05/06/2022   ADDENDUM REPORT: 05/06/2022 19:22 ADDENDUM: Additional IMPRESSION: 8. Prominent soft tissue in the region of the thymus. Findings may represent thymic hyperplasia or other soft tissue nodule. Recommend clinical correlation and follow-up. Electronically Signed   By: Ronney Asters M.D.   On: 05/06/2022 19:22   Result Date: 05/06/2022 CLINICAL DATA:  High probability for PE.  Concern for mass. EXAM: CT ANGIOGRAPHY CHEST WITH CONTRAST TECHNIQUE: Multidetector CT imaging of the chest was performed using the standard protocol during bolus administration of intravenous contrast. Multiplanar CT image reconstructions and MIPs were obtained to evaluate the vascular anatomy. RADIATION DOSE REDUCTION: This exam was performed according to the departmental dose-optimization program which includes automated exposure control, adjustment of the mA and/or kV according to patient size and/or use of iterative reconstruction technique. CONTRAST:  45m OMNIPAQUE IOHEXOL 350 MG/ML SOLN COMPARISON:  CT PE protocol 02/15/2012 FINDINGS: Cardiovascular: There is aneurysmal dilatation of the ascending aorta measuring 4.1 cm which is unchanged from prior. Heart is mildly enlarged. There is no pericardial effusion. There is adequate opacification of the pulmonary arteries. Pulmonary emboli are seen within the distal right main pulmonary artery extending into lobar and segmental branches of the right lower lobe and right  middle lobe. No central or saddle pulmonary embolism identified. Mediastinum/Nodes: Visualized thyroid gland is within normal limits. There is prominent soft tissue in the anterior mediastinum in the region of the thymus measuring up to 16 mm in thickness. No other enlarged lymph nodes are identified. Esophagus is unremarkable. Lungs/Pleura: Severe emphysematous changes have progressed compared to 2013. There are new large bullae within the bilateral lower lobes measuring up to 8 cm on the right and 7 cm on the left. There is adjacent compressive atelectasis. Fibrotic changes are seen in the lung bases, increased from prior. There is stable scarring in both lung apices. New focal ill-defined peripheral airspace opacity in the right upper lobe with slightly nodular component measuring 1.5 by 1.0 by 1.2 cm. Multifocal minimal ground-glass opacity seen in the right middle lobe, new from prior. Trachea and central airways are patent. Upper Abdomen: There are innumerable hypodense structures throughout the liver similar to the prior study, most likely cysts. Bilateral renal cysts are partially visualized. There are surgical clips in the upper abdomen. Musculoskeletal: No chest wall abnormality. No acute or significant osseous findings. Review of the MIP images confirms the above findings. IMPRESSION: 1. Pulmonary emboli within the distal right main pulmonary artery extending into lobar and segmental branches of the right lower lobe and right middle lobe. No evidence for right heart strain. 2. Patchy ground-glass opacities in the right middle lobe, likely infectious/inflammatory. 3. Progression of severe emphysema with large bullae in both lower lobes. 4. Ill-defined peripheral focal opacity in the right upper lobe, slightly nodular, measuring up to 15 mm. This is indeterminate. Consider one of the following in 3 months for both low-risk and high-risk individuals: (a) repeat chest CT, (b) follow-up PET-CT, or (c) tissue  sampling. This recommendation follows the consensus statement: Guidelines for Management of Incidental Pulmonary Nodules Detected on CT Images: From the Fleischner Society 2017; Radiology 2017; 284:228-243. 5. Progression of fibrotic changes in the lung bases. 6. Cardiomegaly. 7. Aneurysmal dilatation of the ascending aorta measuring 4.1 cm. Recommend annual imaging followup by CTA  or MRA. This recommendation follows 2010 ACCF/AHA/AATS/ACR/ASA/SCA/SCAI/SIR/STS/SVM Guidelines for the Diagnosis and Management of Patients with Thoracic Aortic Disease. Circulation. 2010; 121: O130-Q657. Aortic aneurysm NOS (ICD10-I71.9) Aortic Atherosclerosis (ICD10-I70.0) and Emphysema (ICD10-J43.9). Electronically Signed: By: Ronney Asters M.D. On: 05/06/2022 19:06   DG Chest 2 View  Result Date: 05/06/2022 CLINICAL DATA:  Pneumonia. EXAM: CHEST - 2 VIEW COMPARISON:  Chest x-ray 04/01/2021 FINDINGS: Aorta is ectatic, unchanged. Heart size is mildly enlarged, unchanged. There is some patchy opacities in the left lower lung with possible new bulla. There is blunting of the right costophrenic angle. No pleural effusion. Reticulonodular opacities are seen in the lateral right upper lobe. No evidence for pneumothorax. No acute fractures. IMPRESSION: 1. Left lower lobe airspace disease with possible new bullae. 2. Reticulonodular opacities in the right upper lobe, indeterminate. Findings may be infectious/inflammatory. 3. Consider follow-up CT for further evaluation. Electronically Signed   By: Ronney Asters M.D.   On: 05/06/2022 16:14      Scheduled Meds:  dextrose  12.5 g Intravenous STAT   feeding supplement  237 mL Oral TID BM   insulin aspart  0-9 Units Subcutaneous TID AC & HS   metoprolol tartrate  25 mg Oral BID   tamsulosin  0.4 mg Oral Daily   trimethoprim-polymyxin b  1 drop Both Eyes Q6H   Continuous Infusions:  azithromycin 500 mg (05/08/22 0630)   cefTRIAXone (ROCEPHIN)  IV 2 g (05/08/22 0546)   dextrose 5 %  and 0.9% NaCl 75 mL/hr at 05/08/22 1225   heparin 1,050 Units/hr (05/08/22 1359)     LOS: 2 days     Dessa Phi, DO [facility] 05/08/2022, 1:59 PM   Available via Epic secure chat 7am-7pm After these hours, please refer to coverage provider listed on [email or web address]

## 2022-05-08 NOTE — Progress Notes (Signed)
SLP Cancellation Note  Patient Details Name: Robert [name] MRN: [id number] DOB: 11-27-1931   Cancelled treatment:       Reason Eval/Treat Not Completed: Other (comment) Attempted to see pt for swallow eval. He is currently being bathed by nursing staff. Will return for eval.     Osie Bond., M.A. [place] Office [phone]  Secure chat preferred  05/08/2022, 1:28 PM

## 2022-05-09 DIAGNOSIS — R627 Adult failure to thrive: Secondary | ICD-10-CM | POA: Diagnosis not present

## 2022-05-09 DIAGNOSIS — I2699 Other pulmonary embolism without acute cor pulmonale: Secondary | ICD-10-CM | POA: Diagnosis not present

## 2022-05-09 DIAGNOSIS — K8689 Other specified diseases of pancreas: Secondary | ICD-10-CM | POA: Diagnosis not present

## 2022-05-09 DIAGNOSIS — H109 Unspecified conjunctivitis: Secondary | ICD-10-CM | POA: Diagnosis not present

## 2022-05-09 LAB — CBC
HCT: 37.7 % — ABNORMAL LOW (ref 39.0–52.0)
Hemoglobin: 12.5 g/dL — ABNORMAL LOW (ref 13.0–17.0)
MCH: 30.6 pg (ref 26.0–34.0)
MCHC: 33.2 g/dL (ref 30.0–36.0)
MCV: 92.2 fL (ref 80.0–100.0)
Platelets: 153 10*3/uL (ref 150–400)
RBC: 4.09 MIL/uL — ABNORMAL LOW (ref 4.22–5.81)
RDW: 17 % — ABNORMAL HIGH (ref 11.5–15.5)
WBC: 4.4 10*3/uL (ref 4.0–10.5)
nRBC: 0 % (ref 0.0–0.2)

## 2022-05-09 LAB — COMPREHENSIVE METABOLIC PANEL
ALT: 58 U/L — ABNORMAL HIGH (ref 0–44)
AST: 51 U/L — ABNORMAL HIGH (ref 15–41)
Albumin: 1.9 g/dL — ABNORMAL LOW (ref 3.5–5.0)
Alkaline Phosphatase: 315 U/L — ABNORMAL HIGH (ref 38–126)
Anion gap: 11 (ref 5–15)
BUN: 10 mg/dL (ref 8–23)
CO2: 22 mmol/L (ref 22–32)
Calcium: 8.3 mg/dL — ABNORMAL LOW (ref 8.9–10.3)
Chloride: 107 mmol/L (ref 98–111)
Creatinine, Ser: 0.84 mg/dL (ref 0.61–1.24)
GFR, Estimated: >60 mL/min (ref >60)
Glucose, Bld: 147 mg/dL — ABNORMAL HIGH (ref 70–99)
Potassium: 3.8 mmol/L (ref 3.5–5.1)
Sodium: 140 mmol/L (ref 135–145)
Total Bilirubin: 8.6 mg/dL — ABNORMAL HIGH (ref 0.3–1.2)
Total Protein: 5.7 g/dL — ABNORMAL LOW (ref 6.5–8.1)

## 2022-05-09 LAB — GLUCOSE, CAPILLARY
Glucose-Capillary: 113 mg/dL — ABNORMAL HIGH (ref 70–99)
Glucose-Capillary: 118 mg/dL — ABNORMAL HIGH (ref 70–99)
Glucose-Capillary: 129 mg/dL — ABNORMAL HIGH (ref 70–99)
Glucose-Capillary: 133 mg/dL — ABNORMAL HIGH (ref 70–99)
Glucose-Capillary: 90 mg/dL (ref 70–99)

## 2022-05-09 LAB — HEPARIN LEVEL (UNFRACTIONATED): Heparin Unfractionated: 0.56 IU/mL (ref 0.30–0.70)

## 2022-05-09 MED ORDER — ORAL CARE MOUTH RINSE
15.0000 mL | OROMUCOSAL | Status: DC
  Administered 2022-05-10 – 2022-05-13 (×12): 15 mL via OROMUCOSAL

## 2022-05-09 MED ORDER — INSULIN ASPART 100 UNIT/ML IJ SOLN
0.0000 [IU] | INTRAMUSCULAR | Status: DC
  Administered 2022-05-09 – 2022-05-11 (×5): 1 [IU] via SUBCUTANEOUS

## 2022-05-09 MED ORDER — ORAL CARE MOUTH RINSE: 15.0000 mL | OROMUCOSAL | Status: DC | PRN

## 2022-05-09 MED ORDER — METOPROLOL TARTRATE 5 MG/5ML IV SOLN
2.5000 mg | Freq: Four times a day (QID) | INTRAVENOUS | Status: DC
Start: 2022-05-09 — End: 2022-05-11
  Administered 2022-05-09 – 2022-05-11 (×7): 2.5 mg via INTRAVENOUS
  Filled 2022-05-09 (×8): qty 5

## 2022-05-09 MED ORDER — LORAZEPAM 2 MG/ML IJ SOLN
0.5000 mg | INTRAMUSCULAR | Status: DC | PRN
  Administered 2022-05-09 – 2022-05-11 (×3): 0.5 mg via INTRAVENOUS
  Filled 2022-05-09 (×3): qty 1

## 2022-05-09 NOTE — Progress Notes (Signed)
ANTICOAGULATION CONSULT NOTE - Follow Up Consult  Pharmacy Consult for Heparin  Indication: pulmonary embolus  Allergies  Allergen Reactions   Asa W/Codeine [Aspirin-Codeine] Other (See Comments)    Irritates his water system (urination burning)    Patient Measurements: Height: '5\' 11"'$  (180.3 cm) Weight: 52 kg (114 lb 10.2 oz) IBW/kg (Calculated) : 75.3 Heparin Dosing Weight: 52 kg  Vital Signs: Temp: 98.6 F (37 C) (07/16 0624) Temp Source: Oral (07/16 0624) BP: 110/80 (07/16 0624) Pulse Rate: 58 (07/16 0624)  Labs: Recent Labs    05/06/22 1339 05/06/22 1715 05/06/22 2005 05/07/22 0519 05/07/22 1713 05/08/22 0419 05/08/22 1235 05/08/22 2151 05/09/22 0148  HGB 13.2  --   --  12.0*  --  12.4*  --   --  12.5*  HCT 42.5  --   --  36.2*  --  36.9*  --   --  37.7*  PLT 147*  --   --  148*  --  162  --   --  153  APTT  --   --   --  53*  --   --   --   --   --   LABPROT  --   --  17.7* 17.5*  --   --   --   --   --   INR  --   --  1.5* 1.5*  --   --   --   --   --   HEPARINUNFRC  --   --   --  <0.10*   < > 0.57 0.81* 0.65 0.56  CREATININE 1.06  --   --  1.02  --  0.84  --   --   --   TROPONINIHS  --  14 12  --   --   --   --   --   --    < > = values in this interval not displayed.    Estimated Creatinine Clearance: 44.7 mL/min (by C-G formula based on SCr of 0.84 mg/dL).   Medications:  Scheduled:   feeding supplement  237 mL Oral TID BM   insulin aspart  0-9 Units Subcutaneous TID AC & HS   metoprolol tartrate  5 mg Intravenous Q6H   tamsulosin  0.4 mg Oral Daily   trimethoprim-polymyxin b  1 drop Both Eyes Q6H   Infusions:   azithromycin Stopped (05/08/22 0631)   cefTRIAXone (ROCEPHIN)  IV 2 g (05/09/22 0620)   dextrose 5 % and 0.9% NaCl 75 mL/hr at 05/09/22 0400   heparin 1,000 Units/hr (05/09/22 0400)    Assessment: 88 yo M with a history of A-fib, CAD status post MI with DES to the circumflex, T2DM, HTN, HLD, PAD. Patient is presenting with weakness.  CTA PE w/ PE in distal rt main pulmonary artery extending into lobar and segmental branches of RLL. Patient is not on anticoagulation prior to arrival. Pharmacy consulted for heparin dosing.  Heparin level today is therapeutic at 0.56, on 1000 units/hr. Hgb 12.5, plt 153 - stable. No line issues or signs/symptoms of bleeding noted per RN.   Goal of Therapy:  Heparin level 0.3-0.7 units/ml Monitor platelets by anticoagulation protocol: Yes   Plan:  Continue IV heparin at 1000 units/hr. Daily CBC, heparin level. Monitor for signs/symptoms of bleeding.   Vance Peper, PharmD PGY-2 Pharmacy Resident Phone [phone] 05/09/2022 7:27 AM   Please check AMION for all [place] phone numbers After 10:00 PM, call [place] [phone]

## 2022-05-09 NOTE — Evaluation (Signed)
Occupational Therapy Evaluation Patient Details Name: Robert [name] MRN: [id number] DOB: 1932-06-22 Today's Date: 05/09/2022   History of Present Illness Pt is an 88 y/o M presenting to ED on 7/13 from PCP office with generalized weakness, hypotension, and rapid, unintentional weight loss. 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 PMH includes dementia, HTN, HLD, CAD, CHF, DM2, A fib, AAA, remote gastric ulcer.   Clinical Impression   Pt lives with family, needs assist at baseline for  all ADLs, daughter states pt can self feed for partial meals but she normally has to help him with the rest. Pt does not use AD  at baseline, daughter provides min guard/handheld assist for  mobility. Pt currently max A for ADLs, max A+2 for bed mobility and transfers with 2 person handheld assist. Pt needing L knee block and cues to remain upright as pt with trunk flexed in standing. Pt following simple commands with increased time. Pt presenting with impairments listed below, will follow acutely. Recommend SNF at d/c.      Recommendations for follow up therapy are one component of a multi-disciplinary discharge planning process, led by the attending physician.  Recommendations may be updated based on patient status, additional functional criteria and insurance authorization.   Follow Up Recommendations  Skilled nursing-short term rehab (<3 hours/day)    Assistance Recommended at Discharge Frequent or constant Supervision/Assistance  Patient can return home with the following Two people to help with walking and/or transfers;A lot of help with bathing/dressing/bathroom;Assistance with cooking/housework;Assistance with feeding;Direct supervision/assist for financial management;Direct supervision/assist for medications management;Assist for transportation;Help with stairs or ramp for entrance    Functional Status Assessment  Patient has had a recent decline in their functional status and  demonstrates the ability to make significant improvements in function in a reasonable and predictable amount of time.  Equipment Recommendations  None recommended by OT;Other (comment) (defer to next venue of care)    Recommendations for Other Services PT consult     Precautions / Restrictions Precautions Precautions: Fall Restrictions Weight Bearing Restrictions: No      Mobility Bed Mobility Overal bed mobility: Needs Assistance Bed Mobility: Supine to Sit, Sit to Supine     Supine to sit: Max assist, +2 for physical assistance Sit to supine: Max assist, +2 for physical assistance, Mod assist   General bed mobility comments: pt with some task initation to return to supine in bed    Transfers Overall transfer level: Needs assistance Equipment used: 2 person hand held assist Transfers: Sit to/from Stand Sit to Stand: Max assist, +2 physical assistance           General transfer comment: pt with trunk flexed, needing L knee block in standing      Balance Overall balance assessment: Needs assistance Sitting-balance support: Feet supported, Bilateral upper extremity supported Sitting balance-Leahy Scale: Fair Sitting balance - Comments: at times needing up to min A for sitting balance Postural control: Posterior lean Standing balance support: Bilateral upper extremity supported, Reliant on assistive device for balance, During functional activity Standing balance-Leahy Scale: Poor                             ADL either performed or assessed with clinical judgement   ADL Overall ADL's : Needs assistance/impaired Eating/Feeding: NPO   Grooming: Wash/dry face;Set up Grooming Details (indicate cue type and reason): simulated Upper Body Bathing: Maximal assistance;Sitting   Lower Body Bathing: Maximal assistance;Sitting/lateral  leans;Sit to/from stand   Upper Body Dressing : Maximal assistance;Standing;Sitting   Lower Body Dressing: Maximal  assistance;Sitting/lateral leans;Sit to/from stand   Toilet Transfer: Maximal assistance;+2 for physical assistance   Toileting- Clothing Manipulation and Hygiene: Total assistance Toileting - Clothing Manipulation Details (indicate cue type and reason): catheter     Functional mobility during ADLs: Maximal assistance;+2 for physical assistance       Vision   Vision Assessment?: No apparent visual deficits     Perception     Praxis      Pertinent Vitals/Pain Pain Assessment Pain Assessment: Faces Pain Score: 2  Pain Location: generalized with movement, pt unable to specify however grimacing noted Pain Descriptors / Indicators: Discomfort, Grimacing Pain Intervention(s): Limited activity within patient's tolerance, Monitored during session, Repositioned     Hand Dominance     Extremity/Trunk Assessment Upper Extremity Assessment Upper Extremity Assessment: Generalized weakness (2/5 ROM and strength bilaterally)   Lower Extremity Assessment Lower Extremity Assessment: Defer to PT evaluation   Cervical / Trunk Assessment Cervical / Trunk Assessment: Kyphotic   Communication Communication Communication: Expressive difficulties   Cognition Arousal/Alertness: Awake/alert Behavior During Therapy: Flat affect Overall Cognitive Status: History of cognitive impairments - at baseline                                 General Comments: pt with hx of dementia, follow simple commands     General Comments  VSS on RA, family in room and supportive during session    Exercises     Shoulder Instructions      Home Living Family/patient expects to be discharged to:: Private residence Living Arrangements: Other (Comment) (family) Available Help at Discharge: Family Type of Home: House Home Access: Stairs to enter Technical brewer of Steps: 3 Entrance Stairs-Rails: None Home Layout: One level     Bathroom Shower/Tub: Engineer, site: Handicapped height     Home Equipment: Conservation officer, nature (2 wheels);BSC/3in1   Additional Comments: decline in past 2-3 weeks      Prior Functioning/Environment Prior Level of Function : Needs assist             Mobility Comments: walks with min guard supervision from family ADLs Comments: increased incontinence, dependence with all ADLs        OT Problem List: Decreased strength;Decreased range of motion;Decreased activity tolerance;Impaired balance (sitting and/or standing);Decreased cognition;Decreased safety awareness;Decreased knowledge of use of DME or AE;Impaired UE functional use      OT Treatment/Interventions: Self-care/ADL training;Therapeutic exercise;Energy conservation;DME and/or AE instruction;Therapeutic activities;Patient/family education;Balance training    OT Goals(Current goals can be found in the care plan section) Acute Rehab OT Goals Patient Stated Goal: none stated OT Goal Formulation: Patient unable to participate in goal setting Time For Goal Achievement: 05/23/22 Potential to Achieve Goals: Fair ADL Goals Pt Will Perform Upper Body Dressing: with mod assist;sitting;with caregiver independent in assisting Pt Will Perform Lower Body Dressing: with mod assist;sit to/from stand;sitting/lateral leans;with caregiver independent in assisting Pt Will Transfer to Toilet: with min assist;with +2 assist;bedside commode;stand pivot transfer;squat pivot transfer Additional ADL Goal #1: pt will perform bed mobility with mod A in prep for ADLs  OT Frequency: Min 2X/week    Co-evaluation              AM-PAC OT "6 Clicks" Daily Activity     Outcome Measure Help from another person eating meals?: A Little Help  from another person taking care of personal grooming?: A Little Help from another person toileting, which includes using toliet, bedpan, or urinal?: Total Help from another person bathing (including washing, rinsing, drying)?: A Lot Help from  another person to put on and taking off regular upper body clothing?: A Lot Help from another person to put on and taking off regular lower body clothing?: A Lot 6 Click Score: 13   End of Session Equipment Utilized During Treatment: Gait belt Nurse Communication: Mobility status  Activity Tolerance: Patient tolerated treatment well Patient left: in bed;with call bell/phone within reach;with bed alarm set;with family/visitor present  OT Visit Diagnosis: Unsteadiness on feet (R26.81);Other abnormalities of gait and mobility (R26.89);Muscle weakness (generalized) (M62.81);Other symptoms and signs involving cognitive function                Time: 1352-1413 OT Time Calculation (min): 21 min Charges:  OT General Charges $OT Visit: 1 Visit OT Evaluation $OT Eval Moderate Complexity: 1 [address] OTD, OTR/L Acute Rehab [phone]) 832 - [place] 05/09/2022, 3:48 PM

## 2022-05-09 NOTE — Progress Notes (Signed)
Patient's family came to desk and asked for something for patient to help with agitation stating he is trying to get out of the bed and agitated. RN made Dr. Maylene Roes aware that last night patient was given a dose of 0.5 mg IV ativan for agitation and it worked well. MD acknowledged and placed PRN order for ativan.

## 2022-05-09 NOTE — Progress Notes (Signed)
Daily Progress Note   Patient Name: Robert [name]       Date: 05/09/2022 DOB: 1932/08/22  Age: 88 y.o. MRN#: [id number] Attending Physician: Dessa Phi, DO Primary Care Physician: Lavone Orn, MD Admit Date: 05/06/2022  Reason for Consultation/Follow-up: Establishing goals of care, "Newly dx pancreatic mass, further work up in progress. Establish rapport and discuss goals of care with pt and family"  Subjective: Received notification that daughter/Volire called PMT phone.  Chart review performed.  Received report from primary RN - no acute concerns.  Reports patient has been lethargic. SLP eval showed moderate aspiration risk.   Went to visit patient at bedside - daughters Stephanie Coup and [name] were present.  Patient was lying in bed -he had just finished working with OT/mobility team.  Family indicate he was able to sit on edge of bed and stand up for short period of time.  No signs or non-verbal gestures of pain or discomfort noted. No respiratory distress, increased work of breathing, or secretions noted.  Patient was asleep for the duration of my visit. I attempted to elicit values and goals of care important to the patient -family described patient as being "super independent, does not like anyone to fuss over him or wait on him."  Encouraged family to keep patient values at the center of decision making.  Again reviewed the difference between aggressive medical intervention and comfort care.  Family confirm that pending results of EUS and ERCP family will make stepwise decisions - they are open to full comfort care/hospice if medically recommended.  Discussed results of SLP evaluation to show moderate aspiration risk - education provided on aspiration and swallowing dysfunction.  Family  understand that this could cause patient to have increased risk for recurrent hospitalizations/infections.  Discussed this in context of patient's other acute medical issues.  Family indicate patient has been accepting of small bites of applesauce/sips of liquid.  He is likely not eating enough to sustain himself long-term -he will be n.p.o. after midnight in anticipation of procedures tomorrow.  Emotional support provided to family.  Therapeutic listening as [name] reflects on the difficulty of caregiving.   Both daughters are hopeful for family meeting with PMT and their brothers.  Unfortunately, brothers were not able to meet today - hoping that family meeting can occur tomorrow 7/17.  Obtain copies of  both HCPOA and living will.  All questions and concerns addressed. Encouraged to call with questions and/or concerns. PMT card previously provided.  Length of Stay: 3  Current Medications: Scheduled Meds:   feeding supplement  237 mL Oral TID BM   insulin aspart  0-9 Units Subcutaneous Q4H   metoprolol tartrate  2.5 mg Intravenous Q6H   mouth rinse  15 mL Mouth Rinse 4 times per day   tamsulosin  0.4 mg Oral Daily   trimethoprim-polymyxin b  1 drop Both Eyes Q6H    Continuous Infusions:  azithromycin 500 mg (05/09/22 0752)   cefTRIAXone (ROCEPHIN)  IV 2 g (05/09/22 0620)   dextrose 5 % and 0.9% NaCl 75 mL/hr at 05/09/22 1316   heparin 1,000 Units/hr (05/09/22 0400)    PRN Meds: albuterol, hydrALAZINE, ondansetron **OR** ondansetron (ZOFRAN) IV, mouth rinse, polyethylene glycol  Physical Exam Vitals and nursing note reviewed.  Constitutional:      General: He is not in acute distress.    Appearance: He is cachectic. He is ill-appearing.  Pulmonary:     Effort: No respiratory distress.  Skin:    General: Skin is warm and dry.  Neurological:     Mental Status: He is lethargic, disoriented and confused.     Motor: Weakness present.  Psychiatric:        Attention and  Perception: Attention normal.        Speech: Speech is slurred.        Behavior: Behavior is cooperative.        Cognition and Memory: Cognition is impaired. Memory is impaired.             Vital Signs: BP 118/80 (BP Location: Left Arm)   Pulse 61   Temp (!) 97 F (36.1 C) (Axillary)   Resp 17   Ht '5\' 11"'$  (1.803 m)   Wt 52 kg   SpO2 94%   BMI 15.99 kg/m  SpO2: SpO2: 94 % O2 Device: O2 Device: Room Air O2 Flow Rate:    Intake/output summary:  Intake/Output Summary (Last 24 hours) at 05/09/2022 1453 Last data filed at 05/09/2022 1430 Gross per 24 hour  Intake 2342.25 ml  Output 650 ml  Net 1692.25 ml   LBM: Last BM Date : 05/05/22 Baseline Weight: Weight: 52 kg Most recent weight: Weight: 52 kg       Palliative Assessment/Data: PPS 20%      Patient Active Problem List   Diagnosis Date Noted   Pressure injury of skin 05/08/2022   Bacterial conjunctivitis of both eyes 05/07/2022   Pancreatic mass 05/06/2022   Acute pulmonary embolism ([place]) 05/06/2022   Pneumonia of right middle lobe due to infectious organism 05/06/2022   Type 2 diabetes mellitus without complication, without long-term current use of insulin (HCC) 05/06/2022   Mixed hyperlipidemia due to type 2 diabetes mellitus ([place]) 05/06/2022   Chronic diastolic CHF (congestive heart failure) ([place]) 05/06/2022   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   Nodule of upper lobe of right lung 05/06/2022   Fall 06/15/2018   BPH (benign prostatic hyperplasia) 06/15/2018   Microcytic hypochromic anemia 06/15/2018   Hypokalemia 06/15/2018   Abnormal EKG 06/15/2018   Chest pain 08/31/2017   Other fatigue 06/23/2016   Frequent PVCs 06/23/2016   Symptomatic bradycardia 05/20/2015   Pain of left lower extremity-Left Hip/Leg 06/04/2014   Numbness-Bilat Leg 06/04/2014   Weakness of both legs 06/04/2014   Aftercare following surgery of the circulatory system, NEC 10/02/2013   AAA (  abdominal aortic aneurysm) ([place]) 07/18/2012    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 male  with past medical history of dementia, hypertension, hyperlipidemia, coronary artery disease (multiple remote MI's s/p cath in 1987 and cath in 2004 s/p DES to circumflex), diastolic congestive heart failure (echo 05/07/22 EF 30-35%), chronic atrial fibrillation and flutter (on Plavix), diabetes mellitus type 2, abdominal aortic aneurysm presented to ED on 05/06/2022 from PCP office for evaluation of hypotension, generalized weakness, and poor appetite.  Per family, patient has exhibited progressive generalized weakness over the past 3 weeks, significant weight loss of about 25 pounds, with poor appetite.  Patient was admitted on 05/06/2022 with pancreatic mass with significant biliary obstruction, acute pulmonary embolism, pneumonia of the right middle lobe, thymus hyperplasia, nodule of upper lobe of right lung, failure to thrive.   Assessment: Principal Problem:   Pancreatic mass Active Problems:   Coronary artery disease involving native coronary artery of native heart without angina pectoris   Essential hypertension   BPH (benign prostatic hyperplasia)   Acute pulmonary embolism (HCC)   Pneumonia of right middle lobe due to infectious organism   Type 2 diabetes mellitus without complication, without long-term current use of insulin (HCC)   Mixed hyperlipidemia due to type 2 diabetes mellitus (HCC)   Chronic diastolic CHF (congestive heart failure) (HCC)   Thymus hyperplasia (HCC)   Nodule of upper lobe of right lung   Bacterial conjunctivitis of both eyes   Pressure  injury of skin   Concern about end of life  Recommendations/Plan: Continue current medical treatment Continue DNR/DNI as previously documented Proceed with pancreatic mass work-up/ EUS and ERCP - family will make stepwise decisions based on results and options Proceed with MBS After additional information has been obtained from tests and procedures, if medically recommended, family are open for patient transition to full comfort care/hospice  Family understand patient is very frail with likely poor prognosis Unable to meet with full family today - daughters/HCPOAs trying to schedule a time for patient's sons to join. Daughters hopeful for full family meeting tomorrow 7/17 - will call PMT with time if able to meet Ongoing GOC pending clinical course Copies of HCPOA and Living Will obtained - will be scanned into [employer]/ACP Tab PMT will continue to follow and support holistically   Goals of Care and Additional Recommendations: Limitations on Scope of Treatment: Full Scope Treatment  Code Status:    Code Status Orders  (From admission, onward)           Start     Ordered   05/07/22 0616  Do not attempt resuscitation (DNR)  Continuous       Question Answer Comment  In the event of cardiac or respiratory ARREST Do not call a "code blue"   In the event of cardiac or respiratory ARREST Do not perform Intubation, CPR, defibrillation or ACLS   In the event of cardiac or respiratory ARREST Use medication by any route, position, wound care, and other measures to relive pain and suffering. May use oxygen, suction and manual treatment of airway obstruction as  needed for comfort.      05/07/22 0615           Code Status History     Date Active Date Inactive Code Status Order ID Comments User Context   05/06/2022 2303 05/07/2022 0615 Full Code [id number]  Vernelle Emerald, MD ED   06/15/2018 1323 06/16/2018 1940 Full Code [id number]  Norval Morton, MD ED   08/31/2017 2158 09/01/2017 2215  Full Code [id number]  Carnicelli, Earnstine Regal, MD ED       Prognosis:  Poor  Discharge Planning: To Be Determined  Care plan was discussed with primary RN, patient's 2 daughters/HCPOA's  Thank you for allowing the Palliative Medicine Team to assist in the care of this patient.   Total Time 55 minutes Prolonged Time Billed  no       Greater than 50%  of this time was spent counseling and coordinating care related to the above assessment and plan.  Lin Landsman, NP  Please contact Palliative Medicine Team phone at [phone] for questions and concerns.   *Portions of this note are a verbal dictation therefore any spelling and/or grammatical errors are due to the "[employer]" system interpretation.

## 2022-05-09 NOTE — Progress Notes (Signed)
Speech Language Pathology Treatment: Dysphagia  Patient Details Name: Robert [name] MRN: [id number] DOB: 27-Oct-1931 Today's Date: 05/09/2022 Time: 0160-1093 SLP Time Calculation (min) (ACUTE ONLY): 12 min  Assessment / Plan / Recommendation Clinical Impression  F/u after initial swallow assessment yesterday; daughter at bedside. Robert [name] was resting peacefully, and given his recent restlessness, we agreed it would be better to let him rest. We talked about providing very limited POs from floor stock - he did well with small amounts last night with no overt coughing/aspiration. He should be strictly NPO after midnight tonight for potential procedures next date. MBS unfortunately cannot be completed today - family understood this was a possibility. We can look at scheduling this test for Tuesday pending results of [place] meeting today.  Answered questions to best of my ability. SLP will follow.   HPI HPI: Pt is an 88 yo male presenting from his PCP's office with generalized weakness, hypotension, and rapid, unintentional weight loss. Pt found to have pancreatic mass, acute PE, and PNA. Per GI note, daughter has noticed "when he drinks water or eats food he spits it back up." PMH includes: dementia, EGD with dilation, HTN, HLD, CAD, CHF, DMII, afib, AAA, remote gastric ulcer, S/P Bilroth I and II.      SLP Plan  Continue with current plan of care      Recommendations for follow up therapy are one component of a multi-disciplinary discharge planning process, led by the attending physician.  Recommendations may be updated based on patient status, additional functional criteria and insurance authorization.    Recommendations  Diet recommendations: NPO (excuding sips of thins and some purees from floor stock) Medication Administration: Via alternative means Supervision: Trained caregiver to feed patient Postural Changes and/or Swallow Maneuvers: Seated upright 90 degrees                Oral  Care Recommendations: Oral care QID Follow Up Recommendations: Other (comment) (tba) Assistance recommended at discharge: Frequent or constant Supervision/Assistance SLP Visit Diagnosis: Dysphagia, unspecified (R13.10) Plan: Continue with current plan of care          Robert Almeda L. Tivis Ringer, MA CCC/SLP Clinical Specialist - Acute Care SLP Acute Rehabilitation Services Office number [phone]  Robert [name]  05/09/2022, 10:24 AM

## 2022-05-09 NOTE — Progress Notes (Signed)
PROGRESS NOTE    Robert [name]  PZW:[id number] DOB: [birth year] DOA: 05/06/2022 PCP: Lavone Orn, MD     Brief Narrative:  88 year old male with past medical history of dementia, hypertension, hyperlipidemia, coronary artery disease (multiple remote MI's S/P cath in 1987 and cath in 2004 S/P DES to circumflex), diastolic congestive heart failure (Echo 08/2017 EF 55-60% with G1DD), non-insulin-dependent diabetes mellitus type 2, chronic atrial fibrillation and flutter (not on anticoagulation), diabetes mellitus type 2, abdominal aortic aneurysm who presents to [facility] emergency department from his primary care provider's office for evaluation of generalized weakness and hypotension. Also rapid unintentional weight loss.   CT of the chest abdomen and pelvis was obtained finding multiple abnormalities including pulmonary emboli in the distal right main pulmonary artery extending into the right lower and middle lobe without heart strain.  Patient additionally found to have groundglass opacities in the right middle lobe concerning for infection.  Additionally patient was identified to have marked intra and extrahepatic biliary ductal dilatation with obliteration of the extrahepatic bile duct as well as interval development of an marked dilatation of the main pancreatic duct. MRCP showed dilation and pancreatic resulting in obstruction of the distal common bile duct and pancreatic ducts with severe proximal ductal dilation.  Small peripancreatic lymph nodes suspicious for potential nodal metastasis.  Biliary sludge. Normal LFTs with PCP 12/2021. GI and palliative care medicine consulted.   New events last 24 hours / Subjective: Daughter is at bedside.  Patient resting comfortably, failed bedside swallow test.  Remains NPO.  They are planning on doing MBS in the near future.  Daughter states that patient has not complained of any pain.  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[place])   Nodule of upper lobe of right lung   Coronary artery disease involving native coronary artery of native heart without angina pectoris   Type 2 diabetes mellitus without complication, without long-term current use of insulin (HCC)   Chronic diastolic CHF (congestive heart failure) (HCC)   Essential hypertension   Mixed hyperlipidemia due to type 2 diabetes mellitus (HCC)   Bacterial conjunctivitis of both eyes   BPH (benign prostatic hyperplasia)   Pressure injury of skin   Pancreatic mass, with biliary duct dilation -GI consulted -Reevaluate on Monday to discuss possible timing of attempts at EUS and ERCP -Palliative care medicine following, planning for family meeting   Acute PE -IV heparin  Chronic systolic CHF -EF 42% with global hypokinesis seen on echocardiogram after PE -Troponin was negative on admission -Does not appear to be fluid overloaded  Community-acquired pneumonia -Rocephin, azithromycin  Thymus hyperplasia  -Notable hyperplasia and possible soft tissue nodule of the thymus  Nodule of upper lobe of right lung -Unclear if related to underlying malignant process/mets vs second primary malignancy  -Hx of smoking, quit when he was 88 yo   Failure to thrive, poor oral intake -Likely secondary to pancreatic mass as above -Dietitian consulted  Dysphagia -SLP evaluation MBS pending -N.p.o.  CAD -Plavix on hold due to pending procedure  -Lipitor on hold due to elevated LFT  DM type 2 -A1c 7.4 -SSI  Chronic a flutter -Metoprolol, IV  Bacterial conjunctivitis of both eyes -Polytrim eyedrops for 7 days  BPH -Flomax    DVT prophylaxis:  SCDs Start: 05/06/22 2301  Code Status: DNR Family Communication: Daughter at bedside Disposition Plan:  Status is:  Inpatient Remains inpatient appropriate because: Pending further work up  Consultants:  GI PMT   Antimicrobials:  Anti-infectives (From admission, onward)    Start     Dose/Rate Route Frequency Ordered Stop   05/07/22 0630  cefTRIAXone (ROCEPHIN) 2 g in sodium chloride 0.9 % 100 mL IVPB        2 g 200 mL/hr over 30 Minutes Intravenous Every 24 hours 05/07/22 0621     05/07/22 0630  azithromycin (ZITHROMAX) 500 mg in sodium chloride 0.9 % 250 mL IVPB        500 mg 250 mL/hr over 60 Minutes Intravenous Every 24 hours 05/07/22 0621          Objective: Vitals:   05/09/22 0039 05/09/22 0624 05/09/22 0753 05/09/22 1202  BP: 109/75 110/80 124/79 118/80  Pulse: 61 (!) 58 86 61  Resp:  '18 16 17  '$ Temp:  98.6 F (37 C) (!) 96 F (35.6 C) (!) 97 F (36.1 C)  TempSrc:  Oral Axillary Axillary  SpO2: 97% 98% 99% 94%  Weight:      Height:        Intake/Output Summary (Last 24 hours) at 05/09/2022 1316 Last data filed at 05/09/2022 0900 Gross per 24 hour  Intake 2342.25 ml  Output 650 ml  Net 1692.25 ml    Filed Weights   05/08/22 0700  Weight: 52 kg    Examination:  General exam: Appears calm and comfortable Respiratory system: Clear to auscultation. Respiratory effort normal. No respiratory distress. Cardiovascular system: S1 & S2 heard, no peripheral edema Gastrointestinal system: Abdomen is nondistended, soft  Extremities: Symmetric in appearance  Skin: No rashes, lesions or ulcers on exposed skin   Data Reviewed: I have personally reviewed following labs and imaging studies  CBC: Recent Labs  Lab 05/06/22 1339 05/07/22 0519 05/08/22 0419 05/09/22 0148  WBC 5.4 4.8 4.6 4.4  NEUTROABS  --  4.1  --   --   HGB 13.2 12.0* 12.4* 12.5*  HCT 42.5 36.2* 36.9* 37.7*  MCV 98.6 92.1 92.3 92.2  PLT 147* 148* 162 253    Basic Metabolic Panel: Recent Labs  Lab 05/06/22 1339 05/07/22 0519 05/08/22 0419 05/08/22 1235 05/09/22 0554  NA 138 137 140  --  140  K 3.6  3.1* 3.4* 3.7 3.8  CL 106 105 107  --  107  CO2 17* 23 21*  --  22  GLUCOSE 123* 99 117*  --  147*  BUN '17 16 13  '$ --  10  CREATININE 1.06 1.02 0.84  --  0.84  CALCIUM 8.5* 8.2* 8.2*  --  8.3*  MG  --  2.3  --   --   --     GFR: Estimated Creatinine Clearance: 44.7 mL/min (by C-G formula based on SCr of 0.84 mg/dL). Liver Function Tests: Recent Labs  Lab 05/06/22 1715 05/07/22 0519 05/08/22 0419 05/09/22 0554  AST 82* 67* 64* 51*  ALT 74* 64* 63* 58*  ALKPHOS 440* 383* 345* 315*  BILITOT 11.9* 10.8* 9.7* 8.6*  PROT 6.5 6.0* 5.9* 5.7*  ALBUMIN 2.2* 2.2* 2.0* 1.9*    No results for input(s): "LIPASE", "AMYLASE" in the last 168 hours. Recent Labs  Lab 05/07/22 0519  AMMONIA 23    Coagulation Profile: Recent Labs  Lab 05/06/22 2005 05/07/22 0519  INR 1.5* 1.5*    Cardiac Enzymes: No results for input(s): "CKTOTAL", "CKMB", "CKMBINDEX", "  TROPONINI" in the last 168 hours. BNP (last 3 results) No results for input(s): "PROBNP" in the last 8760 hours. HbA1C: Recent Labs    05/07/22 0519  HGBA1C 7.4*    CBG: Recent Labs  Lab 05/08/22 1627 05/08/22 2018 05/08/22 2349 05/09/22 0803 05/09/22 1210  GLUCAP 135* 158* 123* 129* 113*    Lipid Profile: No results for input(s): "CHOL", "HDL", "LDLCALC", "TRIG", "CHOLHDL", "LDLDIRECT" in the last 72 hours. Thyroid Function Tests: No results for input(s): "TSH", "T4TOTAL", "FREET4", "T3FREE", "THYROIDAB" in the last 72 hours. Anemia Panel: No results for input(s): "VITAMINB12", "FOLATE", "FERRITIN", "TIBC", "IRON", "RETICCTPCT" in the last 72 hours. Sepsis Labs: No results for input(s): "PROCALCITON", "LATICACIDVEN" in the last 168 hours.  Recent Results (from the past 240 hour(s))  Culture, blood (Routine X 2) w Reflex to ID Panel     Status: None (Preliminary result)   Collection Time: 05/07/22  6:20 AM   Specimen: BLOOD RIGHT HAND  Result Value Ref Range Status   Specimen Description BLOOD RIGHT HAND  Final    Special Requests   Final    AEROBIC BOTTLE ONLY Blood Culture results may not be optimal due to an inadequate volume of blood received in culture bottles   Culture   Final    NO GROWTH 2 DAYS Performed at [facility], [place] [address] [place], [place] [address]    Report Status PENDING  Incomplete  Culture, blood (Routine X 2) w Reflex to ID Panel     Status: None (Preliminary result)   Collection Time: 05/07/22  6:25 AM   Specimen: BLOOD LEFT HAND  Result Value Ref Range Status   Specimen Description BLOOD LEFT HAND  Final   Special Requests   Final    AEROBIC BOTTLE ONLY Blood Culture results may not be optimal due to an inadequate volume of blood received in culture bottles   Culture   Final    NO GROWTH 2 DAYS Performed at [facility], [place] [address] [place], [place], [place] [address]    Report Status PENDING  Incomplete      Radiology Studies: ECHOCARDIOGRAM COMPLETE  Result Date: 05/07/2022    ECHOCARDIOGRAM REPORT   Patient Name:   CLARK CUFF Date of Exam: 05/07/2022 Medical Rec #:  [id number]      Height:       71.0 in Accession #:    [id number]     Weight:       129.8 lb Date of Birth:  05/11/1932      BSA:          1.755 m Patient Age:    27 years       BP:           128/75 mmHg Patient Gender: M              HR:           122 bpm. Exam Location:  Inpatient Procedure: 2D Echo, Color Doppler and Cardiac Doppler Indications:    Pulmonary embolus  History:        Patient has prior history of Echocardiogram examinations, most                 recent 09/01/2017. Signs/Symptoms:Altered Mental Status.  Sonographer:    Merrie Roof RDCS Referring Phys: [id number] [place]  Sonographer Comments: Technically difficult study due to poor echo windows, suboptimal parasternal window, suboptimal apical window and suboptimal subcostal window. IMPRESSIONS  1. Left ventricular ejection  fraction, by estimation, is 30 to 35%. The left ventricle has moderately decreased function. The left  ventricle demonstrates global hypokinesis. Left ventricular diastolic parameters are indeterminate.  2. Right ventricular systolic function is normal. The right ventricular size is normal.  3. The mitral valve is normal in structure. Moderate to severe mitral valve regurgitation. No evidence of mitral stenosis.  4. The aortic valve is tricuspid. There is mild calcification of the aortic valve. There is mild thickening of the aortic valve. Aortic valve regurgitation is moderate. Aortic valve sclerosis/calcification is present, without any evidence of aortic stenosis.  5. The inferior vena cava is normal in size with greater than 50% respiratory variability, suggesting right atrial pressure of 3 mmHg. Comparison(s): Prior images reviewed side by side. The left ventricular function is worsened. FINDINGS  Left Ventricle: Left ventricular ejection fraction, by estimation, is 30 to 35%. The left ventricle has moderately decreased function. The left ventricle demonstrates global hypokinesis. The left ventricular internal cavity size was normal in size. There is no left ventricular hypertrophy. Left ventricular diastolic parameters are indeterminate. Right Ventricle: The right ventricular size is normal. No increase in right ventricular wall thickness. Right ventricular systolic function is normal. Left Atrium: Left atrial size was normal in size. Right Atrium: Right atrial size was normal in size. Pericardium: There is no evidence of pericardial effusion. Mitral Valve: The mitral valve is normal in structure. Moderate to severe mitral valve regurgitation. No evidence of mitral valve stenosis. Tricuspid Valve: The tricuspid valve is normal in structure. Tricuspid valve regurgitation is not demonstrated. No evidence of tricuspid stenosis. Aortic Valve: The aortic valve is tricuspid. There is mild calcification of the aortic valve. There is mild thickening of the aortic valve. Aortic valve regurgitation is moderate. Aortic  valve sclerosis/calcification is present, without any evidence of aortic stenosis. Pulmonic Valve: The pulmonic valve was normal in structure. Pulmonic valve regurgitation is mild. No evidence of pulmonic stenosis. Aorta: The aortic root is normal in size and structure. Venous: The inferior vena cava is normal in size with greater than 50% respiratory variability, suggesting right atrial pressure of 3 mmHg. IAS/Shunts: No atrial level shunt detected by color flow Doppler.  LEFT VENTRICLE PLAX 2D LVIDd:         5.10 cm LV PW:         0.90 cm LV IVS:        1.00 cm LVOT diam:     2.20 cm LVOT Area:     3.80 cm  LEFT ATRIUM         Index LA diam:    4.10 cm 2.34 cm/m   AORTA Ao Root diam: 3.80 cm  SHUNTS Systemic Diam: 2.20 cm Candee Furbish MD Electronically signed by Candee Furbish MD Signature Date/Time: 05/07/2022/4:07:44 PM    Final       Scheduled Meds:  feeding supplement  237 mL Oral TID BM   insulin aspart  0-9 Units Subcutaneous Q4H   metoprolol tartrate  2.5 mg Intravenous Q6H   mouth rinse  15 mL Mouth Rinse 4 times per day   tamsulosin  0.4 mg Oral Daily   trimethoprim-polymyxin b  1 drop Both Eyes Q6H   Continuous Infusions:  azithromycin 500 mg (05/09/22 0752)   cefTRIAXone (ROCEPHIN)  IV 2 g (05/09/22 0620)   dextrose 5 % and 0.9% NaCl 75 mL/hr at 05/09/22 1316   heparin 1,000 Units/hr (05/09/22 0400)     LOS: 3 days     Dessa Phi,  DO [facility] 05/09/2022, 1:16 PM   Available via Epic secure chat 7am-7pm After these hours, please refer to coverage provider listed on [email or web address]

## 2022-05-10 DIAGNOSIS — K8689 Other specified diseases of pancreas: Secondary | ICD-10-CM | POA: Diagnosis not present

## 2022-05-10 LAB — COMPREHENSIVE METABOLIC PANEL
ALT: 60 U/L — ABNORMAL HIGH (ref 0–44)
AST: 59 U/L — ABNORMAL HIGH (ref 15–41)
Albumin: 2 g/dL — ABNORMAL LOW (ref 3.5–5.0)
Alkaline Phosphatase: 314 U/L — ABNORMAL HIGH (ref 38–126)
Anion gap: 16 — ABNORMAL HIGH (ref 5–15)
BUN: 9 mg/dL (ref 8–23)
CO2: 19 mmol/L — ABNORMAL LOW (ref 22–32)
Calcium: 8.7 mg/dL — ABNORMAL LOW (ref 8.9–10.3)
Chloride: 108 mmol/L (ref 98–111)
Creatinine, Ser: 0.72 mg/dL (ref 0.61–1.24)
GFR, Estimated: >60 mL/min (ref >60)
Glucose, Bld: 113 mg/dL — ABNORMAL HIGH (ref 70–99)
Potassium: 4 mmol/L (ref 3.5–5.1)
Sodium: 143 mmol/L (ref 135–145)
Total Bilirubin: 8.7 mg/dL — ABNORMAL HIGH (ref 0.3–1.2)
Total Protein: 6.2 g/dL — ABNORMAL LOW (ref 6.5–8.1)

## 2022-05-10 LAB — CBC
HCT: 43 % (ref 39.0–52.0)
Hemoglobin: 14.1 g/dL (ref 13.0–17.0)
MCH: 30.4 pg (ref 26.0–34.0)
MCHC: 32.8 g/dL (ref 30.0–36.0)
MCV: 92.7 fL (ref 80.0–100.0)
Platelets: 156 10*3/uL (ref 150–400)
RBC: 4.64 MIL/uL (ref 4.22–5.81)
RDW: 17.2 % — ABNORMAL HIGH (ref 11.5–15.5)
WBC: 4.6 10*3/uL (ref 4.0–10.5)
nRBC: 0 % (ref 0.0–0.2)

## 2022-05-10 LAB — HEPARIN LEVEL (UNFRACTIONATED): Heparin Unfractionated: 0.62 IU/mL (ref 0.30–0.70)

## 2022-05-10 LAB — GLUCOSE, CAPILLARY
Glucose-Capillary: 100 mg/dL — ABNORMAL HIGH (ref 70–99)
Glucose-Capillary: 112 mg/dL — ABNORMAL HIGH (ref 70–99)
Glucose-Capillary: 116 mg/dL — ABNORMAL HIGH (ref 70–99)
Glucose-Capillary: 134 mg/dL — ABNORMAL HIGH (ref 70–99)
Glucose-Capillary: 138 mg/dL — ABNORMAL HIGH (ref 70–99)
Glucose-Capillary: 92 mg/dL (ref 70–99)

## 2022-05-10 MED ORDER — MORPHINE SULFATE (PF) 2 MG/ML IV SOLN
0.5000 mg | INTRAVENOUS | Status: DC | PRN
  Administered 2022-05-10: 0.5 mg via INTRAVENOUS
  Filled 2022-05-10: qty 1

## 2022-05-10 NOTE — Progress Notes (Signed)
ANTICOAGULATION CONSULT NOTE - Follow Up Consult  Pharmacy Consult for Heparin  Indication: pulmonary embolus  Allergies  Allergen Reactions   Asa W/Codeine [Aspirin-Codeine] Other (See Comments)    Irritates his water system (urination burning)    Patient Measurements: Height: '5\' 11"'$  (180.3 cm) Weight: 52 kg (114 lb 10.2 oz) IBW/kg (Calculated) : 75.3 Heparin Dosing Weight: 52 kg  Vital Signs: Temp: 97.8 F (36.6 C) (07/17 0418) Temp Source: Oral (07/17 0002) BP: 118/86 (07/17 0820) Pulse Rate: 98 (07/17 0820)  Labs: Recent Labs    05/08/22 0419 05/08/22 1235 05/08/22 2151 05/09/22 0148 05/09/22 0554 05/10/22 0611 05/10/22 0751  HGB 12.4*  --   --  12.5*  --  14.1  --   HCT 36.9*  --   --  37.7*  --  43.0  --   PLT 162  --   --  153  --  156  --   HEPARINUNFRC 0.57   < > 0.65 0.56  --   --  0.62  CREATININE 0.84  --   --   --  0.84 0.72  --    < > = values in this interval not displayed.     Estimated Creatinine Clearance: 46.9 mL/min (by C-G formula based on SCr of 0.72 mg/dL).   Medications:  Scheduled:   feeding supplement  237 mL Oral TID BM   insulin aspart  0-9 Units Subcutaneous Q4H   metoprolol tartrate  2.5 mg Intravenous Q6H   mouth rinse  15 mL Mouth Rinse 4 times per day   tamsulosin  0.4 mg Oral Daily   trimethoprim-polymyxin b  1 drop Both Eyes Q6H   Infusions:   azithromycin 500 mg (05/10/22 0643)   cefTRIAXone (ROCEPHIN)  IV 2 g (05/10/22 0603)   dextrose 5 % and 0.9% NaCl 75 mL/hr at 05/09/22 2359   heparin 1,000 Units/hr (05/09/22 1952)    Assessment: 88 yo M with a history of A-fib, CAD status post MI with DES to the circumflex, T2DM, HTN, HLD, PAD. Patient is presenting with weakness. CTA PE w/ PE in distal rt main pulmonary artery extending into lobar and segmental branches of RLL. Patient is not on anticoagulation prior to arrival. Pharmacy consulted for heparin dosing.  Heparin level today is therapeutic at 0.62, on 1000  units/hr. CBC is normal. No signs/symptoms of bleeding noted.  Goal of Therapy:  Heparin level 0.3-0.7 units/ml Monitor platelets by anticoagulation protocol: Yes   Plan:  Continue IV heparin at 1000 units/hr Daily CBC, heparin level Monitor for signs/symptoms of bleeding Follow-up ability to take PO anticoagulation  Thank you for involving pharmacy in this patient's care.  Renold Genta, PharmD, BCPS Clinical Pharmacist Clinical phone for 05/10/2022 until 3p is [phone] 05/10/2022 11:56 AM

## 2022-05-10 NOTE — Progress Notes (Signed)
Daily Progress Note   Patient Name: Robert [name]       Date: 05/10/2022 DOB: [birth year]  Age: 88 y.o. MRN#: [id number] Attending Physician: Robert Phi, DO Primary Care Physician: Robert Orn, MD Admit Date: 05/06/2022  Reason for Consultation/Follow-up: Establishing goals of care, "Newly dx pancreatic mass, further work up in progress. Establish rapport and discuss goals of care with pt and family"  Subjective: Medical records reviewed including progress notes, labs, imaging. Patient assessed at the bedside. Discussed with RN.  He is sitting in bedside chair in no acute distress.  His 2 daughters and 2 sons are present for scheduled family meeting.  Reviewed patient's daughters's conversation with my colleague Robert Picker, NP.  Reviewed his life and personality.  He is described as strong-willed, feisty and independent.  He is always happy to give advice and help his loved ones.  He is "old school."  Family was hoping to have input from GI prior to our family meeting.  I shared my professional opinion that it is likely he will be a poor candidate for ERCP and general anesthesia, given his comorbidities and ongoing failure to thrive.  We discussed possible options moving forward and family is all in agreement that they would never want chemotherapy or radiation surgery etc.  Family is interested in biopsy results for closure and ultimately want to honor his wishes returning home as soon as possible.  Outpatient hospice was reviewed in detail including comfort focused philosophy, available resources, locations that support can be provided such as home, facilities, residential hospice.  Comfort medications were reviewed thoroughly.  Patient's sons are adamant that he should return home with hospice  and all four children are willing to provide the majority of his care as the natural disease process continues.  They understand that at home, hospice resources are still limited.  They are all in agreement that this is likely the direction then we will proceed and are open to continue the conversation based on GI recommendations.  Questions and concerns addressed. PMT will continue to support holistically. PMT card provided.  Length of Stay: 4  Physical Exam Vitals and nursing note reviewed.  Constitutional:      General: He is not in acute distress.    Appearance: He is cachectic. He is ill-appearing.  Pulmonary:     Effort:  No respiratory distress.  Skin:    General: Skin is warm and dry.  Neurological:     Mental Status: He is lethargic, disoriented and confused.     Motor: Weakness present.  Psychiatric:        Behavior: Behavior is cooperative.        Cognition and Memory: Cognition is impaired. Memory is impaired.             Vital Signs: BP 118/86 (BP Location: Right Arm)   Pulse 98   Temp 97.8 F (36.6 C)   Resp 17   Ht '5\' 11"'$  (1.803 m)   Wt 52 kg   SpO2 (!) 89%   BMI 15.99 kg/m  SpO2: SpO2: (!) 89 % O2 Device: O2 Device: Room Air O2 Flow Rate:    Palliative Assessment/Data: PPS 20%     Palliative Care Assessment & Plan   Patient Profile: 88 y.o. male  with past medical history of dementia, hypertension, hyperlipidemia, coronary artery disease (multiple remote MI's s/p cath in 1987 and cath in 2004 s/p DES to circumflex), diastolic congestive heart failure (echo 05/07/22 EF 30-35%), chronic atrial fibrillation and flutter (on Plavix), diabetes mellitus type 2, abdominal aortic aneurysm presented to ED on 05/06/2022 from PCP office for evaluation of hypotension, generalized weakness, and poor appetite.  Per family, patient has exhibited progressive generalized weakness over the past 3 weeks, significant weight loss of about 25 pounds, with poor appetite.  Patient  was admitted on 05/06/2022 with pancreatic mass with significant biliary obstruction, acute pulmonary embolism, pneumonia of the right middle lobe, thymus hyperplasia, nodule of upper lobe of right lung, failure to thrive.   Assessment: Principal Problem:   Pancreatic mass Active Problems:   Coronary artery disease involving native coronary artery of native heart without angina pectoris   Essential hypertension   BPH (benign prostatic hyperplasia)   Acute pulmonary embolism (HCC)   Pneumonia of right middle lobe due to infectious organism   Type 2 diabetes mellitus without complication, without long-term current use of insulin (HCC)   Mixed hyperlipidemia due to type 2 diabetes mellitus (HCC)   Chronic diastolic CHF (congestive heart failure) (HCC)   Thymus hyperplasia (HCC)   Nodule of upper lobe of right lung   Bacterial conjunctivitis of both eyes   Pressure injury of skin   Concern about end of life  Recommendations/Plan: Continue DNR/DNI Continue current care, family awaits GI recommendations to proceed with ongoing goals of care conversations Family will likely decide on hospice at home in the coming days Psychosocial emotional support provided PMT will continue to follow and support holistically   Prognosis:  Poor  Discharge Planning: To Be Determined  Care plan was discussed with primary RN, patient's 2 daughters/HCPOA's, 2 sons, Dr. Maylene [name]    Total time: I spent 68 minutes in the care of the patient today in the above activities and documenting the encounter. Prolonged time: Yes   Robert Cooler, PA-C Palliative Medicine Team Team phone # [phone]  Thank you for allowing the Palliative Medicine Team to assist in the care of this patient. Please utilize secure chat with additional questions, if there is no response within 30 minutes please call the above phone number.  Palliative Medicine Team providers are available by phone from 7am to 7pm daily and can  be reached through the team cell phone.  Should this patient require assistance outside of these hours, please call the patient's attending physician.

## 2022-05-10 NOTE — Plan of Care (Signed)
With intermittent agitation and restless, PRN med given.  Problem: Coping: Goal: Level of anxiety will decrease Outcome: Progressing   Problem: Safety: Goal: Ability to remain free from injury will improve Outcome: Progressing

## 2022-05-10 NOTE — Evaluation (Signed)
Physical Therapy Evaluation Patient Details Name: Robert [name] MRN: [id number] DOB: 1932/03/02 Today's Date: 05/10/2022  History of Present Illness  Pt is an 88 y/o M presenting to ED on 7/13 from PCP office with generalized weakness, hypotension, and rapid, unintentional weight loss. 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 PMH includes dementia, HTN, HLD, CAD, CHF, DM2, A fib, AAA, remote gastric ulcer.  Clinical Impression  Prior to admission, pt required assistance with all ADLs and was ambulating with minguard/HHA. Pt limited this session by lethargy and decreased ability to follow commands. Required maxA+2 for bed mobility and transfers, increased cueing required throughout. Pt would benefit from acute PT while inpatient to address deficits in strength, balance, and functional mobility. Recommending SNF upon discharge to maximize functional independence for eventual return home.      Recommendations for follow up therapy are one component of a multi-disciplinary discharge planning process, led by the attending physician.  Recommendations may be updated based on patient status, additional functional criteria and insurance authorization.  Follow Up Recommendations Skilled nursing-short term rehab (<3 hours/day) Can patient physically be transported by private vehicle: No    Assistance Recommended at Discharge Frequent or constant Supervision/Assistance  Patient can return home with the following  A lot of help with walking and/or transfers;A lot of help with bathing/dressing/bathroom;Assistance with cooking/housework;Assistance with feeding;Direct supervision/assist for medications management;Direct supervision/assist for financial management;Assist for transportation;Help with stairs or ramp for entrance    Equipment Recommendations Wheelchair (measurements PT);Wheelchair cushion (measurements PT)  Recommendations for Other Services       Functional Status  Assessment Patient has had a recent decline in their functional status and demonstrates the ability to make significant improvements in function in a reasonable and predictable amount of time.     Precautions / Restrictions Precautions Precautions: Fall Restrictions Weight Bearing Restrictions: No      Mobility  Bed Mobility Overal bed mobility: Needs Assistance Bed Mobility: Supine to Sit     Supine to sit: Max assist, +2 for physical assistance     General bed mobility comments: some LE initiation to move toward EOB but maxA+2 for LE and trunk assist    Transfers Overall transfer level: Needs assistance Equipment used: 2 person hand held assist, Ambulation equipment used Transfers: Sit to/from Stand, Bed to chair/wheelchair/BSC Sit to Stand: Max assist, +2 physical assistance Stand pivot transfers: Max assist, +2 physical assistance         General transfer comment: better performance with hand held assist and gait belt versus just gait belt. Requires B knee block and multimodal cueing for upright posture. Able to stand from chair with stedy and significant cueing    Ambulation/Gait                  Stairs            Wheelchair Mobility    Modified Rankin (Stroke Patients Only)       Balance Overall balance assessment: Needs assistance Sitting-balance support: Feet supported, Bilateral upper extremity supported Sitting balance-Leahy Scale: Fair Sitting balance - Comments: occasional minA. multimodal cueing for anterior weight shift, able to maintain upright posture   Standing balance support: Bilateral upper extremity supported Standing balance-Leahy Scale: Zero Standing balance comment: +2 assist to maintain standing                             Pertinent Vitals/Pain Pain Assessment Pain Assessment: No/denies pain  Home Living Family/patient expects to be discharged to:: Private residence Living Arrangements:  Children Available Help at Discharge: Family Type of Home: House Home Access: Stairs to enter Entrance Stairs-Rails: None Technical brewer of Steps: 3   Home Layout: One level Home Equipment: Conservation officer, nature (2 wheels);BSC/3in1 Additional Comments: decline in past 2-3 weeks    Prior Function Prior Level of Function : Needs assist             Mobility Comments: walks with min guard supervision from family ADLs Comments: increased incontinence, dependence with all ADLs     Hand Dominance        Extremity/Trunk Assessment   Upper Extremity Assessment Upper Extremity Assessment: Generalized weakness    Lower Extremity Assessment Lower Extremity Assessment: Generalized weakness    Cervical / Trunk Assessment Cervical / Trunk Assessment: Kyphotic  Communication   Communication: HOH;Expressive difficulties  Cognition Arousal/Alertness: Lethargic Behavior During Therapy: Flat affect Overall Cognitive Status: History of cognitive impairments - at baseline                                 General Comments: hx of dementia, inability to follow commands without multimodal cueing. attempting to stand on his own        General Comments      Exercises     Assessment/Plan    PT Assessment Patient needs continued PT services  PT Problem List Decreased strength;Decreased range of motion;Decreased activity tolerance;Decreased mobility;Decreased balance;Decreased cognition;Decreased safety awareness       PT Treatment Interventions DME instruction;Gait training;Functional mobility training;Therapeutic activities;Therapeutic exercise;Balance training;Patient/family education;Wheelchair mobility training    PT Goals (Current goals can be found in the Care Plan section)  Acute Rehab PT Goals Patient Stated Goal: unable to state PT Goal Formulation: With patient/family Time For Goal Achievement: 05/24/22 Potential to Achieve Goals: Good    Frequency  Min 3X/week     Co-evaluation               AM-PAC PT "6 Clicks" Mobility  Outcome Measure Help needed turning from your back to your side while in a flat bed without using bedrails?: A Lot Help needed moving from lying on your back to sitting on the side of a flat bed without using bedrails?: A Lot Help needed moving to and from a bed to a chair (including a wheelchair)?: Total Help needed standing up from a chair using your arms (e.g., wheelchair or bedside chair)?: A Lot Help needed to walk in hospital room?: Total Help needed climbing 3-5 steps with a railing? : Total 6 Click Score: 9    End of Session Equipment Utilized During Treatment: Gait belt Activity Tolerance: Patient tolerated treatment well;Patient limited by lethargy Patient left: in chair;with call bell/phone within reach;with chair alarm set;with nursing/sitter in room;with family/visitor present Nurse Communication: Mobility status;Need for lift equipment PT Visit Diagnosis: Muscle weakness (generalized) (M62.81);Difficulty in walking, not elsewhere classified (R26.2)    Time: 8119-1478 PT Time Calculation (min) (ACUTE ONLY): 25 min   Charges:   PT Evaluation $PT Eval Moderate Complexity: 1 Mod PT Treatments $Therapeutic Activity: 8-22 mins       Mackie Pai, SPT Acute Rehabilitation Services  Office: [phone]   Mackie Pai 05/10/2022, 12:08 PM

## 2022-05-10 NOTE — Progress Notes (Signed)
Mobility Specialist Progress Note:   05/10/22 1345  Mobility  Activity Transferred from chair to bed  Level of Assistance Dependent, patient does less than 25%  Assistive Device None  Distance Ambulated (ft) 2 ft  Activity Response Tolerated fair  $Mobility charge 1 Mobility   Pt received slid down in chair. Required totalA to reposition in chair, then transfer to bed. Pt with bilat knee buckling upon standing, not able to follow any commands at this time. Pt left in bed with bed alarm on.   Nelta Numbers Acute Rehab Secure Chat or Office Phone: [phone]

## 2022-05-10 NOTE — Progress Notes (Addendum)
[facility] Gastroenterology Progress Note  Robert [name] 88 y.o. 07-19-1932  CC:  Elevated LFTs, obstructive jaundice   Subjective: Patient seen and examined this morning with daughter at bedside.  He is disoriented and daughter provides history.  No bowel movement since admission per chart review.  Patient's daughter denies any emesis or complaint of pain from her father.  States his urine is starting to look more normal, was very dark on admission.  States patient has lost 25 pounds in the last few weeks and has had progressive generalized weakness.  Reports he seemed normal around Father's Day, eating normally and holding conversations.  Dementia currently worse than at baseline.  ROS : Review of Systems  Unable to perform ROS: Dementia    Objective: Vital signs in last 24 hours: Vitals:   05/10/22 0418 05/10/22 0820  BP: 126/85 118/86  Pulse: (!) 110 98  Resp: 16 17  Temp: 97.8 F (36.6 C)   SpO2: 91% (!) 89%    Physical Exam:  General:  Awake, disoriented, thin/frail  Head:  Normocephalic, without obvious abnormality, atraumatic  Eyes:  Scleral icterus  Lungs:   Respirations unlabored, no respiratory distress  Heart:  Regular rate and rhythm, S1, S2 normal  Abdomen:   Soft, non-tender, bowel sounds active all four quadrants,  no masses palpated, no guarding or rebound  Extremities: Extremities normal, atraumatic, no edema  Pulses: 2+ and symmetric    Lab Results: Recent Labs    05/09/22 0554 05/10/22 0611  NA 140 143  K 3.8 4.0  CL 107 108  CO2 22 19*  GLUCOSE 147* 113*  BUN 10 9  CREATININE 0.84 0.72  CALCIUM 8.3* 8.7*   Recent Labs    05/09/22 0554 05/10/22 0611  AST 51* 59*  ALT 58* 60*  ALKPHOS 315* 314*  BILITOT 8.6* 8.7*  PROT 5.7* 6.2*  ALBUMIN 1.9* 2.0*   Recent Labs    05/09/22 0148 05/10/22 0611  WBC 4.4 4.6  HGB 12.5* 14.1  HCT 37.7* 43.0  MCV 92.2 92.7  PLT 153 156   No results for input(s): "LABPROT", "INR" in the last 72  hours.  Assessment Elevated LFTs, obstructive jaundice - AST 59, ALT 60, alk phos 314 -T. bili 8.7 - No leukocytosis - Hemoglobin 14.1, platelets 156 - AFP and CA 19-9 normal - CEA elevated at 12.9 - MRCP 7/14: Solid lesion in pancreatic head resulting in obstruction of the distal common bile duct and pancreatic ducts with severe proximal ductal dilation.  Small peripancreatic lymph nodes suspicious for potential nodal metastasis.  Biliary sludge   PE - On heparin   Pneumonia - On Rocephin and azithromycin   Diastolic congestive heart failure - Echo 08/2017 shows ejection fraction 55 to 60%   A-fib - On Plavix at home  Plan: Pancreatic mass seen on MRCP with obstruction of the distal common bile duct and pancreatic ducts, peripancreatic lymph node suspicious for potential nodal mets. Patient currently being treated for PE and pneumonia. At some point will need ERCP and EUS, timing to be discussed with Dr. Paulita Fujita who will see patient later today. NPO for now. Continue supportive care. Continue to hold Plavix. Continue to trend LFTs. [facility] will follow.  Angelique Holm PA-C 05/10/2022, 10:41 AM  Contact #  [phone]

## 2022-05-10 NOTE — Progress Notes (Signed)
PROGRESS NOTE    Robert [name]  BWI:[id number] DOB: 10/11/1932 DOA: 05/06/2022 PCP: Lavone Orn, MD     Brief Narrative:  88 year old male with past medical history of dementia, hypertension, hyperlipidemia, coronary artery disease (multiple remote MI's S/P cath in 1987 and cath in 2004 S/P DES to circumflex), diastolic congestive heart failure (Echo 08/2017 EF 55-60% with G1DD), non-insulin-dependent diabetes mellitus type 2, chronic atrial fibrillation and flutter (not on anticoagulation), diabetes mellitus type 2, abdominal aortic aneurysm who presents to [facility] emergency department from his primary care provider's office for evaluation of generalized weakness and hypotension. Also rapid unintentional weight loss.   CT of the chest abdomen and pelvis was obtained finding multiple abnormalities including pulmonary emboli in the distal right main pulmonary artery extending into the right lower and middle lobe without heart strain.  Patient additionally found to have groundglass opacities in the right middle lobe concerning for infection.  Additionally patient was identified to have marked intra and extrahepatic biliary ductal dilatation with obliteration of the extrahepatic bile duct as well as interval development of an marked dilatation of the main pancreatic duct. MRCP showed dilation and pancreatic resulting in obstruction of the distal common bile duct and pancreatic ducts with severe proximal ductal dilation.  Small peripancreatic lymph nodes suspicious for potential nodal metastasis.  Biliary sludge. Normal LFTs with PCP 12/2021. GI and palliative care medicine consulted.   New events last 24 hours / Subjective: Daughter is at bedside.  Patient has been very restless overnight.  Initially Ativan was helping but overnight did not help much.  Patient has not complained of much other than wanting to go home.  Remains n.p.o. due to dysphagia.  Assessment & Plan:  Principal  Problem:   Pancreatic mass Active Problems:   Acute pulmonary embolism (HCC)   Pneumonia of right middle lobe due to infectious organism   Thymus hyperplasia (HCC)   Nodule of upper lobe of right lung   Coronary artery disease involving native coronary artery of native heart without angina pectoris   Type 2 diabetes mellitus without complication, without long-term current use of insulin (HCC)   Chronic diastolic CHF (congestive heart failure) (HCC)   Essential hypertension   Mixed hyperlipidemia due to type 2 diabetes mellitus (HCC)   Bacterial conjunctivitis of both eyes   BPH (benign prostatic hyperplasia)   Pressure injury of skin   Pancreatic mass, with biliary duct dilation -GI consulted -Awaiting input for possible timing of attempts at EUS and ERCP -Palliative care medicine following, family meeting today  Acute PE -IV heparin  Chronic systolic CHF -EF 63% with global hypokinesis seen on echocardiogram after PE -Troponin was negative on admission -Does not appear to be fluid overloaded  Community-acquired pneumonia -Rocephin, azithromycin x 5 days   Thymus hyperplasia  -Notable hyperplasia and possible soft tissue nodule of the thymus  Nodule of upper lobe of right lung -Unclear if related to underlying malignant process/mets vs second primary malignancy  -Hx of smoking, quit when he was 88 yo   Failure to thrive, poor oral intake -Likely secondary to pancreatic mass as above -Dietitian consulted  Dysphagia -SLP evaluation MBS pending -N.p.o.  CAD -Plavix on hold due to pending procedure  -Lipitor on hold due to elevated LFT  DM type 2 -A1c 7.4 -SSI  Chronic a flutter -Metoprolol, IV  Bacterial conjunctivitis of both eyes -Polytrim eyedrops for 7 days  BPH -Flomax    DVT prophylaxis:  SCDs Start: 05/06/22 2301  Code Status:  DNR Family Communication: Daughter at bedside Disposition Plan:  Status is: Inpatient Remains inpatient appropriate  because: Pending further work up  Consultants:  GI PMT   Antimicrobials:  Anti-infectives (From admission, onward)    Start     Dose/Rate Route Frequency Ordered Stop   05/07/22 0630  cefTRIAXone (ROCEPHIN) 2 g in sodium chloride 0.9 % 100 mL IVPB        2 g 200 mL/hr over 30 Minutes Intravenous Every 24 hours 05/07/22 0621 05/12/22 0629   05/07/22 0630  azithromycin (ZITHROMAX) 500 mg in sodium chloride 0.9 % 250 mL IVPB        500 mg 250 mL/hr over 60 Minutes Intravenous Every 24 hours 05/07/22 0621 05/12/22 0629        Objective: Vitals:   05/09/22 1949 05/10/22 0002 05/10/22 0418 05/10/22 0820  BP: 121/81 123/76 126/85 118/86  Pulse: 91 (!) 105 (!) 110 98  Resp: '18 16 16 17  '$ Temp: (!) 97.1 F (36.2 C) (!) 97.5 F (36.4 C) 97.8 F (36.6 C)   TempSrc: Oral Oral    SpO2: 100% 92% 91% (!) 89%  Weight:      Height:        Intake/Output Summary (Last 24 hours) at 05/10/2022 1358 Last data filed at 05/10/2022 0700 Gross per 24 hour  Intake 1280.64 ml  Output 200 ml  Net 1080.64 ml    Filed Weights   05/08/22 0700  Weight: 52 kg    Examination:  General exam: Appears restless, alert, difficult to understand mumbled speech  Respiratory system: Clear to auscultation. Respiratory effort normal. No respiratory distress. Cardiovascular system: S1 & S2 heard, tachycardic irreg rhythm, no peripheral edema Gastrointestinal system: Abdomen is nondistended, soft  Extremities: Symmetric in appearance  Skin: No rashes, lesions or ulcers on exposed skin   Data Reviewed: I have personally reviewed following labs and imaging studies  CBC: Recent Labs  Lab 05/06/22 1339 05/07/22 0519 05/08/22 0419 05/09/22 0148 05/10/22 0611  WBC 5.4 4.8 4.6 4.4 4.6  NEUTROABS  --  4.1  --   --   --   HGB 13.2 12.0* 12.4* 12.5* 14.1  HCT 42.5 36.2* 36.9* 37.7* 43.0  MCV 98.6 92.1 92.3 92.2 92.7  PLT 147* 148* 162 153 539    Basic Metabolic Panel: Recent Labs  Lab  05/06/22 1339 05/07/22 0519 05/08/22 0419 05/08/22 1235 05/09/22 0554 05/10/22 0611  NA 138 137 140  --  140 143  K 3.6 3.1* 3.4* 3.7 3.8 4.0  CL 106 105 107  --  107 108  CO2 17* 23 21*  --  22 19*  GLUCOSE 123* 99 117*  --  147* 113*  BUN '17 16 13  '$ --  10 9  CREATININE 1.06 1.02 0.84  --  0.84 0.72  CALCIUM 8.5* 8.2* 8.2*  --  8.3* 8.7*  MG  --  2.3  --   --   --   --     GFR: Estimated Creatinine Clearance: 46.9 mL/min (by C-G formula based on SCr of 0.72 mg/dL). Liver Function Tests: Recent Labs  Lab 05/06/22 1715 05/07/22 0519 05/08/22 0419 05/09/22 0554 05/10/22 0611  AST 82* 67* 64* 51* 59*  ALT 74* 64* 63* 58* 60*  ALKPHOS 440* 383* 345* 315* 314*  BILITOT 11.9* 10.8* 9.7* 8.6* 8.7*  PROT 6.5 6.0* 5.9* 5.7* 6.2*  ALBUMIN 2.2* 2.2* 2.0* 1.9* 2.0*    No results for input(s): "LIPASE", "AMYLASE" in the last 168  hours. Recent Labs  Lab 05/07/22 0519  AMMONIA 23    Coagulation Profile: Recent Labs  Lab 05/06/22 2005 05/07/22 0519  INR 1.5* 1.5*    Cardiac Enzymes: No results for input(s): "CKTOTAL", "CKMB", "CKMBINDEX", "TROPONINI" in the last 168 hours. BNP (last 3 results) No results for input(s): "PROBNP" in the last 8760 hours. HbA1C: No results for input(s): "HGBA1C" in the last 72 hours.  CBG: Recent Labs  Lab 05/09/22 1959 05/09/22 2350 05/10/22 0434 05/10/22 0801 05/10/22 1149  GLUCAP 118* 90 100* 112* 134*    Lipid Profile: No results for input(s): "CHOL", "HDL", "LDLCALC", "TRIG", "CHOLHDL", "LDLDIRECT" in the last 72 hours. Thyroid Function Tests: No results for input(s): "TSH", "T4TOTAL", "FREET4", "T3FREE", "THYROIDAB" in the last 72 hours. Anemia Panel: No results for input(s): "VITAMINB12", "FOLATE", "FERRITIN", "TIBC", "IRON", "RETICCTPCT" in the last 72 hours. Sepsis Labs: No results for input(s): "PROCALCITON", "LATICACIDVEN" in the last 168 hours.  Recent Results (from the past 240 hour(s))  Culture, blood (Routine X  2) w Reflex to ID Panel     Status: None (Preliminary result)   Collection Time: 05/07/22  6:20 AM   Specimen: BLOOD RIGHT HAND  Result Value Ref Range Status   Specimen Description BLOOD RIGHT HAND  Final   Special Requests   Final    AEROBIC BOTTLE ONLY Blood Culture results may not be optimal due to an inadequate volume of blood received in culture bottles   Culture   Final    NO GROWTH 3 DAYS Performed at [facility], [place] [address], [place], [place] [address]    Report Status PENDING  Incomplete  Culture, blood (Routine X 2) w Reflex to ID Panel     Status: None (Preliminary result)   Collection Time: 05/07/22  6:25 AM   Specimen: BLOOD LEFT HAND  Result Value Ref Range Status   Specimen Description BLOOD LEFT HAND  Final   Special Requests   Final    AEROBIC BOTTLE ONLY Blood Culture results may not be optimal due to an inadequate volume of blood received in culture bottles   Culture   Final    NO GROWTH 3 DAYS Performed at [facility], [place] [address], [place], [place] [address]    Report Status PENDING  Incomplete      Radiology Studies: No results found.    Scheduled Meds:  feeding supplement  237 mL Oral TID BM   insulin aspart  0-9 Units Subcutaneous Q4H   metoprolol tartrate  2.5 mg Intravenous Q6H   mouth rinse  15 mL Mouth Rinse 4 times per day   tamsulosin  0.4 mg Oral Daily   trimethoprim-polymyxin b  1 drop Both Eyes Q6H   Continuous Infusions:  azithromycin 500 mg (05/10/22 0643)   cefTRIAXone (ROCEPHIN)  IV 2 g (05/10/22 0603)   dextrose 5 % and 0.9% NaCl 75 mL/hr at 05/09/22 2359   heparin 1,000 Units/hr (05/09/22 1952)     LOS: 4 days     Dessa Phi, DO [facility] 05/10/2022, 1:58 PM   Available via Epic secure chat 7am-7pm After these hours, please refer to coverage provider listed on [email or web address]

## 2022-05-10 NOTE — Progress Notes (Signed)
SLP Cancellation Note  Patient Details Name: Robert [name] MRN: [id number] DOB: 01-09-1932   Cancelled treatment:       Reason Eval/Treat Not Completed: Medical issues which prohibited therapy. Touched base with RN and reviewed chart. Note that other GI procedure may still be pending for today, so at the moment SLP will hold any PO trials or further testing. However, will continue to follow along as able and as still indicated as additional w/u and [place] meetings take place.     Osie Bond., M.A. [place] Office [phone]  Secure chat preferred  05/10/2022, 10:33 AM

## 2022-05-10 NOTE — Care Management Important Message (Signed)
Important Message  Patient Details  Name: Robert [name] MRN: [id number] Date of Birth: August 22, 1932   Medicare Important Message Given:  Yes     Namari Breton [address] 05/10/2022, 3:53 PM

## 2022-05-11 DIAGNOSIS — K8689 Other specified diseases of pancreas: Secondary | ICD-10-CM | POA: Diagnosis not present

## 2022-05-11 LAB — CBC
HCT: 36.7 % — ABNORMAL LOW (ref 39.0–52.0)
Hemoglobin: 12.4 g/dL — ABNORMAL LOW (ref 13.0–17.0)
MCH: 31 pg (ref 26.0–34.0)
MCHC: 33.8 g/dL (ref 30.0–36.0)
MCV: 91.8 fL (ref 80.0–100.0)
Platelets: 124 10*3/uL — ABNORMAL LOW (ref 150–400)
RBC: 4 MIL/uL — ABNORMAL LOW (ref 4.22–5.81)
RDW: 17.2 % — ABNORMAL HIGH (ref 11.5–15.5)
WBC: 3.6 10*3/uL — ABNORMAL LOW (ref 4.0–10.5)
nRBC: 0 % (ref 0.0–0.2)

## 2022-05-11 LAB — COMPREHENSIVE METABOLIC PANEL
ALT: 57 U/L — ABNORMAL HIGH (ref 0–44)
AST: 51 U/L — ABNORMAL HIGH (ref 15–41)
Albumin: 1.8 g/dL — ABNORMAL LOW (ref 3.5–5.0)
Alkaline Phosphatase: 260 U/L — ABNORMAL HIGH (ref 38–126)
Anion gap: 16 — ABNORMAL HIGH (ref 5–15)
BUN: 8 mg/dL (ref 8–23)
CO2: 18 mmol/L — ABNORMAL LOW (ref 22–32)
Calcium: 8.5 mg/dL — ABNORMAL LOW (ref 8.9–10.3)
Chloride: 111 mmol/L (ref 98–111)
Creatinine, Ser: 0.72 mg/dL (ref 0.61–1.24)
GFR, Estimated: >60 mL/min (ref >60)
Glucose, Bld: 97 mg/dL (ref 70–99)
Potassium: 3.4 mmol/L — ABNORMAL LOW (ref 3.5–5.1)
Sodium: 145 mmol/L (ref 135–145)
Total Bilirubin: 7.2 mg/dL — ABNORMAL HIGH (ref 0.3–1.2)
Total Protein: 5.6 g/dL — ABNORMAL LOW (ref 6.5–8.1)

## 2022-05-11 LAB — HEPARIN LEVEL (UNFRACTIONATED): Heparin Unfractionated: 0.61 IU/mL (ref 0.30–0.70)

## 2022-05-11 LAB — GLUCOSE, CAPILLARY
Glucose-Capillary: 79 mg/dL (ref 70–99)
Glucose-Capillary: 118 mg/dL — ABNORMAL HIGH (ref 70–99)
Glucose-Capillary: 130 mg/dL — ABNORMAL HIGH (ref 70–99)

## 2022-05-11 MED ORDER — HALOPERIDOL LACTATE 5 MG/ML IJ SOLN
0.5000 mg | INTRAMUSCULAR | Status: DC | PRN
  Administered 2022-05-11: 0.5 mg via INTRAVENOUS
  Filled 2022-05-11: qty 1

## 2022-05-11 MED ORDER — POLYVINYL ALCOHOL 1.4 % OP SOLN
1.0000 [drp] | Freq: Four times a day (QID) | OPHTHALMIC | Status: DC | PRN
  Filled 2022-05-11: qty 15

## 2022-05-11 MED ORDER — MORPHINE SULFATE (PF) 2 MG/ML IV SOLN
1.0000 mg | INTRAVENOUS | Status: DC | PRN
  Administered 2022-05-11: 1 mg via INTRAVENOUS
  Filled 2022-05-11: qty 1

## 2022-05-11 MED ORDER — HALOPERIDOL 0.5 MG PO TABS
0.5000 mg | ORAL_TABLET | ORAL | Status: DC | PRN
  Filled 2022-05-11: qty 1

## 2022-05-11 MED ORDER — ACETAMINOPHEN 325 MG PO TABS: 650.0000 mg | ORAL_TABLET | Freq: Four times a day (QID) | ORAL | Status: DC | PRN

## 2022-05-11 MED ORDER — GLYCOPYRROLATE 0.2 MG/ML IJ SOLN: 0.2000 mg | INTRAMUSCULAR | Status: DC | PRN

## 2022-05-11 MED ORDER — HALOPERIDOL LACTATE 2 MG/ML PO CONC: 0.5000 mg | ORAL | Status: DC | PRN

## 2022-05-11 MED ORDER — MORPHINE SULFATE (CONCENTRATE) 10 MG/0.5ML PO SOLN: 5.0000 mg | ORAL | Status: DC | PRN

## 2022-05-11 MED ORDER — GLYCOPYRROLATE 1 MG PO TABS: 1.0000 mg | ORAL_TABLET | ORAL | Status: DC | PRN

## 2022-05-11 MED ORDER — LORAZEPAM 2 MG/ML IJ SOLN
1.0000 mg | INTRAMUSCULAR | Status: DC | PRN
  Administered 2022-05-12 – 2022-05-13 (×2): 1 mg via INTRAVENOUS
  Filled 2022-05-11 (×2): qty 1

## 2022-05-11 MED ORDER — BIOTENE DRY MOUTH MT LIQD: 15.0000 mL | OROMUCOSAL | Status: DC | PRN

## 2022-05-11 MED ORDER — KCL IN DEXTROSE-NACL 40-5-0.45 MEQ/L-%-% IV SOLN
INTRAVENOUS | Status: DC
  Filled 2022-05-11: qty 1000

## 2022-05-11 MED ORDER — ACETAMINOPHEN 650 MG RE SUPP: 650.0000 mg | Freq: Four times a day (QID) | RECTAL | Status: DC | PRN

## 2022-05-11 NOTE — Progress Notes (Signed)
PROGRESS NOTE    Robert [name]  CBJ:[id number] DOB: April 22, 1932 DOA: 05/06/2022 PCP: Lavone Orn, MD     Brief Narrative:  88 year old male with past medical history of dementia, hypertension, hyperlipidemia, coronary artery disease (multiple remote MI's S/P cath in 1987 and cath in 2004 S/P DES to circumflex), diastolic congestive heart failure (Echo 08/2017 EF 55-60% with G1DD), non-insulin-dependent diabetes mellitus type 2, chronic atrial fibrillation and flutter (not on anticoagulation), diabetes mellitus type 2, abdominal aortic aneurysm who presents to [facility] emergency department from his primary care provider's office for evaluation of generalized weakness and hypotension. Also rapid unintentional weight loss.   CT of the chest abdomen and pelvis was obtained finding multiple abnormalities including pulmonary emboli in the distal right main pulmonary artery extending into the right lower and middle lobe without heart strain.  Patient additionally found to have groundglass opacities in the right middle lobe concerning for infection.  Additionally patient was identified to have marked intra and extrahepatic biliary ductal dilatation with obliteration of the extrahepatic bile duct as well as interval development of an marked dilatation of the main pancreatic duct. MRCP showed dilation and pancreatic resulting in obstruction of the distal common bile duct and pancreatic ducts with severe proximal ductal dilation.  Small peripancreatic lymph nodes suspicious for potential nodal metastasis.  Biliary sludge. Normal LFTs with PCP 12/2021. GI and palliative care medicine consulted.   New events last 24 hours / Subjective: Daughter is at bedside.  Patient does not participate in exam. She says he was doing ok yesterday, but restless again overnight requiring IV ativan. They're waiting to speak with GI again and decide on home hospice.   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 GI and palliative care following for continued goals of care conversation  Acute PE -IV heparin  Chronic systolic CHF -EF 16% with global hypokinesis seen on echocardiogram after PE -Troponin was negative on admission -Does not appear to be fluid overloaded  Community-acquired pneumonia -Completed rocephin, azithromycin x 5 days   Thymus hyperplasia  -Notable hyperplasia and possible soft tissue nodule of the thymus  Nodule of upper lobe of right lung -Unclear if related to underlying malignant process/mets vs second primary malignancy  -Hx of smoking, quit when he was 88 yo   Failure to thrive, poor oral intake -Likely secondary to pancreatic mass as above -Dietitian consulted  Dysphagia -SLP evaluation MBS pending -N.p.o.  CAD -Plavix on hold due to pending procedure  -Lipitor on hold due to elevated LFT  DM type 2 -A1c 7.4 -SSI  Chronic a flutter -Metoprolol IV due to NPO   Bacterial conjunctivitis of both eyes -Polytrim eyedrops for 7 days  BPH -Flomax   Hypokalemia -Replace    DVT prophylaxis:  SCDs Start: 05/06/22 2301  Code Status:  DNR Family Communication: Daughter at bedside Disposition Plan:  Status is: Inpatient Remains inpatient appropriate because: Pending  further work up and goals of care conversation   Consultants:  GI PMT   Antimicrobials:  Anti-infectives (From admission, onward)    Start     Dose/Rate Route Frequency Ordered Stop   05/07/22 0630  cefTRIAXone (ROCEPHIN) 2 g in sodium chloride 0.9 % 100 mL IVPB        2 g 200 mL/hr over 30 Minutes Intravenous Every 24 hours 05/07/22 0621 05/11/22 0630   05/07/22 0630  azithromycin (ZITHROMAX) 500 mg in sodium chloride 0.9 % 250 mL IVPB        500 mg 250 mL/hr over 60 Minutes Intravenous Every 24 hours 05/07/22 0621 05/11/22 0734        Objective: Vitals:   05/10/22 1543 05/10/22 2020 05/11/22 0423 05/11/22 0801  BP: 103/86 131/75 133/81 106/67  Pulse: 93 74 89 91  Resp: '15 16 17 14  '$ Temp:      TempSrc:      SpO2: 93% (!) 84% (!) 89% 95%  Weight:      Height:        Intake/Output Summary (Last 24 hours) at 05/11/2022 0948 Last data filed at 05/11/2022 0635 Gross per 24 hour  Intake 3306.64 ml  Output 755 ml  Net 2551.64 ml    Filed Weights   05/08/22 0700  Weight: 52 kg    Examination:  General exam: Appears resting comfortably  Respiratory system: Clear to auscultation. Respiratory effort normal. No respiratory distress. Cardiovascular system: S1 & S2 heard, irreg rhythm, brady rate 50-60s  Gastrointestinal system: Abdomen is nondistended, soft  Extremities: Symmetric in appearance  Skin: No rashes, lesions or ulcers on exposed skin   Data Reviewed: I have personally reviewed following labs and imaging studies  CBC: Recent Labs  Lab 05/07/22 0519 05/08/22 0419 05/09/22 0148 05/10/22 0611 05/11/22 0214  WBC 4.8 4.6 4.4 4.6 3.6*  NEUTROABS 4.1  --   --   --   --   HGB 12.0* 12.4* 12.5* 14.1 12.4*  HCT 36.2* 36.9* 37.7* 43.0 36.7*  MCV 92.1 92.3 92.2 92.7 91.8  PLT 148* 162 153 156 124*    Basic Metabolic Panel: Recent Labs  Lab 05/07/22 0519 05/08/22 0419 05/08/22 1235 05/09/22 0554 05/10/22 0611 05/11/22 0214  NA 137 140  --  140 143  145  K 3.1* 3.4* 3.7 3.8 4.0 3.4*  CL 105 107  --  107 108 111  CO2 23 21*  --  22 19* 18*  GLUCOSE 99 117*  --  147* 113* 97  BUN 16 13  --  '10 9 8  '$ CREATININE 1.02 0.84  --  0.84 0.72 0.72  CALCIUM 8.2* 8.2*  --  8.3* 8.7* 8.5*  MG 2.3  --   --   --   --   --     GFR: Estimated Creatinine Clearance: 46.9 mL/min (by C-G formula based on SCr of 0.72 mg/dL). Liver Function Tests: Recent Labs  Lab 05/07/22 0519 05/08/22 0419 05/09/22 0554 05/10/22 0611 05/11/22 0214  AST 67* 64* 51* 59* 51*  ALT 64* 63* 58* 60* 57*  ALKPHOS 383* 345* 315* 314* 260*  BILITOT 10.8* 9.7* 8.6* 8.7* 7.2*  PROT 6.0* 5.9* 5.7* 6.2* 5.6*  ALBUMIN 2.2* 2.0* 1.9* 2.0* 1.8*    No results for input(s): "LIPASE", "AMYLASE" in the last 168 hours. Recent Labs  Lab 05/07/22 0519  AMMONIA 23  Coagulation Profile: Recent Labs  Lab 05/06/22 2005 05/07/22 0519  INR 1.5* 1.5*    Cardiac Enzymes: No results for input(s): "CKTOTAL", "CKMB", "CKMBINDEX", "TROPONINI" in the last 168 hours. BNP (last 3 results) No results for input(s): "PROBNP" in the last 8760 hours. HbA1C: No results for input(s): "HGBA1C" in the last 72 hours.  CBG: Recent Labs  Lab 05/10/22 1600 05/10/22 2022 05/10/22 2358 05/11/22 0421 05/11/22 0803  GLUCAP 138* 116* 92 79 118*    Lipid Profile: No results for input(s): "CHOL", "HDL", "LDLCALC", "TRIG", "CHOLHDL", "LDLDIRECT" in the last 72 hours. Thyroid Function Tests: No results for input(s): "TSH", "T4TOTAL", "FREET4", "T3FREE", "THYROIDAB" in the last 72 hours. Anemia Panel: No results for input(s): "VITAMINB12", "FOLATE", "FERRITIN", "TIBC", "IRON", "RETICCTPCT" in the last 72 hours. Sepsis Labs: No results for input(s): "PROCALCITON", "LATICACIDVEN" in the last 168 hours.  Recent Results (from the past 240 hour(s))  Culture, blood (Routine X 2) w Reflex to ID Panel     Status: None (Preliminary result)   Collection Time: 05/07/22  6:20 AM   Specimen: BLOOD  RIGHT HAND  Result Value Ref Range Status   Specimen Description BLOOD RIGHT HAND  Final   Special Requests   Final    AEROBIC BOTTLE ONLY Blood Culture results may not be optimal due to an inadequate volume of blood received in culture bottles   Culture   Final    NO GROWTH 3 DAYS Performed at [facility], [place] [address], [place], [place] [address]    Report Status PENDING  Incomplete  Culture, blood (Routine X 2) w Reflex to ID Panel     Status: None (Preliminary result)   Collection Time: 05/07/22  6:25 AM   Specimen: BLOOD LEFT HAND  Result Value Ref Range Status   Specimen Description BLOOD LEFT HAND  Final   Special Requests   Final    AEROBIC BOTTLE ONLY Blood Culture results may not be optimal due to an inadequate volume of blood received in culture bottles   Culture   Final    NO GROWTH 3 DAYS Performed at [facility], [place] [address], [place], [place] [address]    Report Status PENDING  Incomplete      Radiology Studies: No results found.    Scheduled Meds:  feeding supplement  237 mL Oral TID BM   insulin aspart  0-9 Units Subcutaneous Q4H   metoprolol tartrate  2.5 mg Intravenous Q6H   mouth rinse  15 mL Mouth Rinse 4 times per day   tamsulosin  0.4 mg Oral Daily   trimethoprim-polymyxin b  1 drop Both Eyes Q6H   Continuous Infusions:  dextrose 5 % and 0.45 % NaCl with KCl 40 mEq/L     heparin 1,000 Units/hr (05/10/22 2129)     LOS: 5 days     Dessa Phi, DO [facility] 05/11/2022, 9:48 AM   Available via Epic secure chat 7am-7pm After these hours, please refer to coverage provider listed on [email or web address]

## 2022-05-11 NOTE — Progress Notes (Signed)
Daily Progress Note   Patient Name: Robert [name]       Date: 05/11/2022 DOB: [birth year]  Age: 88 y.o. MRN#: [id number] Attending Physician: Dessa Phi, DO Primary Care Physician: Lavone Orn, MD Admit Date: 05/06/2022  Reason for Consultation/Follow-up: Establishing goals of care, "Newly dx pancreatic mass, further work up in progress. Establish rapport and discuss goals of care with pt and family"  Subjective: Medical records reviewed including progress notes, labs, imaging. Patient assessed at the bedside. Discussed with RN. Patient's son and daughter are present at the bedside.  Follow up on yesterday's goals of care meeting and further discussions with GI. Family confirms with me that they are ready to proceed with discharge home to focus on comfort care. They initially share that hospice is not entirely off the table, but they would like to see how they can care for patient on their own first. We then discussed available resources for support and expert symptom management at end of life that patient would benefit from hospice involvement. Family ultimately agrees to proceed with referral today.   We discussed comfort care measures in the meanwhile during the remainder of patient admission. He would no longer receive aggressive medical interventions such as continuous vital signs, lab work, radiology testing, or medications not focused on comfort. All care would focus on how the patient is looking and feeling. This would include management of any symptoms that may cause discomfort, pain, shortness of breath, cough, nausea, agitation, anxiety, and/or secretions etc. Symptoms would be managed with medications and other non-pharmacological interventions such as spiritual support if requested,  repositioning, music therapy, or therapeutic listening. Family verbalized understanding and appreciation.   Further discussed comfort feeding and trial of puree while admitted for pleasure rather than nutritional goals. Family is in agreement.  Questions and concerns addressed. PMT will continue to support holistically. PMT card provided.  Length of Stay: 5  Physical Exam Vitals and nursing note reviewed.  Constitutional:      General: He is not in acute distress.    Appearance: He is cachectic. He is ill-appearing.  Pulmonary:     Effort: No respiratory distress.  Skin:    General: Skin is warm and dry.  Neurological:     Mental Status: He is lethargic, disoriented and confused.     Motor: Weakness present.  Psychiatric:        Behavior: Behavior is cooperative.        Cognition and Memory: Cognition is impaired. Memory is impaired.            Vital Signs: BP 120/76   Pulse 85   Temp 97.8 F (36.6 C)   Resp 14   Ht '5\' 11"'$  (1.803 m)   Wt 52 kg   SpO2 95%   BMI 15.99 kg/m  SpO2: SpO2: 95 % O2 Device: O2 Device: Room Air O2 Flow Rate:    Palliative Assessment/Data: PPS 20%     Palliative Care Assessment & Plan   Patient Profile: 88 y.o. male  with past medical history of dementia, hypertension, hyperlipidemia, coronary artery disease (multiple remote MI's s/p cath in 1987 and cath in 2004 s/p DES to circumflex), diastolic congestive heart failure (echo 05/07/22 EF 30-35%), chronic atrial fibrillation and flutter (on Plavix), diabetes mellitus type 2, abdominal aortic aneurysm presented to ED on 05/06/2022 from PCP office for evaluation of hypotension, generalized weakness, and poor appetite.  Per family, patient has exhibited progressive generalized weakness over the past 3 weeks, significant weight loss of about 25 pounds, with poor appetite.  Patient was admitted on 05/06/2022 with pancreatic mass with significant biliary obstruction, acute pulmonary embolism, pneumonia of  the right middle lobe, thymus hyperplasia, nodule of upper lobe of right lung, failure to thrive.   Assessment: Principal Problem:   Pancreatic mass Active Problems:   Coronary artery disease involving native coronary artery of native heart without angina pectoris   Essential hypertension   BPH (benign prostatic hyperplasia)   Acute pulmonary embolism (HCC)   Pneumonia of right middle lobe due to infectious organism   Type 2 diabetes mellitus without complication, without long-term current use of insulin (HCC)   Mixed hyperlipidemia due to type 2 diabetes mellitus (HCC)   Chronic diastolic CHF (congestive heart failure) (HCC)   Thymus hyperplasia (HCC)   Nodule of upper lobe of right lung   Bacterial conjunctivitis of both eyes   Pressure injury of skin   Concern about end of life  Recommendations/Plan: Continue DNR Transition to comfort measures today after discussion with family Morphine PRN for pain/air hunger/comfort Robinul PRN for excessive secretions Ativan PRN for agitation/anxiety Zofran PRN for nausea Liquifilm tears PRN for dry eyes Haldol PRN for agitation/anxiety May have comfort feeding Comfort cart for family Unrestricted visitations in the setting of EOL (per policy) Oxygen PRN 2L or less for comfort. No escalation.   TOC assistance appreciated with coordination of return home on hospice, family is researching hospice programs PMT will continue to follow and support  Prognosis:  < 6 months  Discharge Planning: To Be Determined  Care plan was discussed with primary RN, patient's daughter and son, Dr. Maylene Roes, Pollyann Glen SLP   MDM: [place]   Dorthy Cooler, [facility] Palliative Medicine Team Team phone # [phone]  Thank you for allowing the Palliative Medicine Team to assist in the care of this patient. Please utilize secure chat with additional questions, if there is no response within 30 minutes please call the above phone number.  Palliative Medicine  Team providers are available by phone from 7am to 7pm daily and can be reached through the team cell phone.  Should this patient require assistance outside of these hours, please call the patient's attending physician.

## 2022-05-11 NOTE — Progress Notes (Signed)
ANTICOAGULATION CONSULT NOTE - Follow Up Consult  Pharmacy Consult for Heparin  Indication: pulmonary embolus  Allergies  Allergen Reactions   Asa W/Codeine [Aspirin-Codeine] Other (See Comments)    Irritates his water system (urination burning)    Patient Measurements: Height: '5\' 11"'$  (180.3 cm) Weight: 52 kg (114 lb 10.2 oz) IBW/kg (Calculated) : 75.3 Heparin Dosing Weight: 52 kg  Vital Signs: BP: 106/67 (07/18 0801) Pulse Rate: 91 (07/18 0801)  Labs: Recent Labs    05/09/22 0148 05/09/22 0554 05/10/22 0611 05/10/22 0751 05/11/22 0214  HGB 12.5*  --  14.1  --  12.4*  HCT 37.7*  --  43.0  --  36.7*  PLT 153  --  156  --  124*  HEPARINUNFRC 0.56  --   --  0.62 0.61  CREATININE  --  0.84 0.72  --  0.72    Estimated Creatinine Clearance: 46.9 mL/min (by C-G formula based on SCr of 0.72 mg/dL).   Medications:  Scheduled:   feeding supplement  237 mL Oral TID BM   insulin aspart  0-9 Units Subcutaneous Q4H   metoprolol tartrate  2.5 mg Intravenous Q6H   mouth rinse  15 mL Mouth Rinse 4 times per day   tamsulosin  0.4 mg Oral Daily   trimethoprim-polymyxin b  1 drop Both Eyes Q6H   Infusions:   dextrose 5 % and 0.45 % NaCl with KCl 40 mEq/L 75 mL/hr at 05/11/22 1009   heparin 1,000 Units/hr (05/10/22 2129)    Assessment: 88 yo M with a history of A-fib, CAD status post MI with DES to the circumflex, T2DM, HTN, HLD, PAD. Patient is presenting with weakness. CTA PE w/ PE in distal rt main pulmonary artery extending into lobar and segmental branches of RLL. Patient is not on anticoagulation prior to arrival. Pharmacy consulted for heparin dosing.  Heparin level today is at upper end of therapeutic at 0.61 on 1000 units/hr. CBC is stable. No signs/symptoms of bleeding noted.  Goal of Therapy:  Heparin level 0.3-0.7 units/ml Monitor platelets by anticoagulation protocol: Yes   Plan:  Decrease IV heparin to 950 units/hr to keep in goal  Daily CBC, heparin  level Monitor for signs/symptoms of bleeding Follow up long term plan, possibly going to discharge with hospice   Thank you for involving pharmacy in this patient's care.  Benetta Spar, PharmD, BCPS, BCCP Clinical Pharmacist  Please check AMION for all [place] phone numbers After 10:00 PM, call [place] [phone]

## 2022-05-11 NOTE — Progress Notes (Signed)
SLP Cancellation Note  Patient Details Name: Robert [name] MRN: [id number] DOB: Aug 01, 1932   Cancelled treatment:       Reason Eval/Treat Not Completed: Medical issues which prohibited therapy. Chart reviewed - family still awaiting GI input, so SLP holding POs at this time. Depending on GOC moving forward, will f/u clinically vs with MBS depending on family wishes.     Osie Bond., M.A. [place] Office [phone]  Secure chat preferred  05/11/2022, 11:56 AM

## 2022-05-11 NOTE — Progress Notes (Signed)
OT Cancellation Note  Patient Details Name: Robert [name] MRN: [id number] DOB: Mar 24, 1932   Cancelled Treatment:    Reason Eval/Treat Not Completed: Other (comment) (pt now comfort care, OT orders discontinued.)  Lynnda Child, OTD, OTR/L Acute Rehab [phone] - [place] 05/11/2022, 3:29 PM

## 2022-05-11 NOTE — Progress Notes (Signed)
Subjective: No voiced complaints  Objective: Vital signs in last 24 hours: Pulse Rate:  [74-93] 91 (07/18 0801) Resp:  [14-17] 14 (07/18 0801) BP: (103-133)/(67-86) 106/67 (07/18 0801) SpO2:  [84 %-95 %] 95 % (07/18 0801) Weight change:  Last BM Date : 05/05/22  PE: GEN:  Cachectic, non verbal, NAD  Lab Results: CBC    Component Value Date/Time   WBC 3.6 (L) 05/11/2022 0214   RBC 4.00 (L) 05/11/2022 0214   HGB 12.4 (L) 05/11/2022 0214   HCT 36.7 (L) 05/11/2022 0214   PLT 124 (L) 05/11/2022 0214   MCV 91.8 05/11/2022 0214   MCH 31.0 05/11/2022 0214   MCHC 33.8 05/11/2022 0214   RDW 17.2 (H) 05/11/2022 0214   LYMPHSABS 0.4 (L) 05/07/2022 0519   MONOABS 0.3 05/07/2022 0519   EOSABS 0.0 05/07/2022 0519   BASOSABS 0.0 05/07/2022 0519  CMP     Component Value Date/Time   NA 145 05/11/2022 0214   K 3.4 (L) 05/11/2022 0214   CL 111 05/11/2022 0214   CO2 18 (L) 05/11/2022 0214   GLUCOSE 97 05/11/2022 0214   BUN 8 05/11/2022 0214   CREATININE 0.72 05/11/2022 0214   CREATININE 0.92 06/03/2014 1438   CALCIUM 8.5 (L) 05/11/2022 0214   PROT 5.6 (L) 05/11/2022 0214   ALBUMIN 1.8 (L) 05/11/2022 0214   AST 51 (H) 05/11/2022 0214   ALT 57 (H) 05/11/2022 0214   ALKPHOS 260 (H) 05/11/2022 0214   BILITOT 7.2 (H) 05/11/2022 0214   GFRNONAA >60 05/11/2022 0214   GFRAA 55 (L) 06/18/2018 1545   Assessment:   Pancreatic mass with obstructive jaundice. Prior gastric surgeries x 2. Profound cachexia and protein-calorie malnutrition.  Plan:   I have reviewed case at bedside with one of patient's daughters.  It is the desire of the family to pursue hospice.  For all the reasons outlined yesterday, I agree with this approach. [facility] will sign-off; please call with questions; thank you for the consultation.   Landry Dyke 05/11/2022, 12:04 PM   Cell [phone] If no answer or after 5 PM call [phone]

## 2022-05-11 NOTE — Plan of Care (Signed)
With episode of bradycardia overnight.  Problem: Coping: Goal: Ability to adjust to condition or change in health will improve Outcome: Progressing   Problem: Fluid Volume: Goal: Ability to maintain a balanced intake and output will improve Outcome: Progressing

## 2022-05-11 NOTE — Progress Notes (Signed)
Speech Language Pathology Treatment: Dysphagia  Patient Details Name: Robert [name] MRN: [id number] DOB: Jun 19, 1932 Today's Date: 05/11/2022 Time: 7915-0569 SLP Time Calculation (min) (ACUTE ONLY): 37 min  Assessment / Plan / Recommendation Clinical Impression  SLP followed up with family, with pt sleeping and family preferring that he be allowed to rest. They confirm that they would like to take him home with hospice. Discussed swallowing and options for comfort feeds. They are in agreement with holding off on MBS in light of allowing him to continue to have comfort feeds. They report that thin liquids and purees seem to have been going well without overt coughing or regurgitation, but it sounds like he takes in very little at a time (5-7 spoonfuls of applesauce, a few sips of a drink or ice chips). We discussed ways to try to continue to reduce risks as much as possible while still acknowledging that they are there, in order to offer him textures or flavors that improve QOL. They would like more flavor options, so recommend starting him on Dys 1 diet and thin liquids so that he will have more variety from the kitchen than just what is open to him from the floor stock. This will also give family some opportunity to see what this texture looks like before the resume responsibility of care giving at home. Daughter and son present are in agreement; also discussed with palliative care provider. Will continue to follow and provide education.    HPI HPI: Pt is an 88 yo male presenting from his PCP's office with generalized weakness, hypotension, and rapid, unintentional weight loss. Pt found to have pancreatic mass, acute PE, and PNA. Per GI note, daughter has noticed "when he drinks water or eats food he spits it back up." PMH includes: dementia, EGD with dilation, HTN, HLD, CAD, CHF, DMII, afib, AAA, remote gastric ulcer, S/P Bilroth I and II.      SLP Plan  Continue with current plan of care       Recommendations for follow up therapy are one component of a multi-disciplinary discharge planning process, led by the attending physician.  Recommendations may be updated based on patient status, additional functional criteria and insurance authorization.    Recommendations  Diet recommendations: Dysphagia 1 (puree);Thin liquid (for comfort) Liquids provided via: Teaspoon;Cup;Straw Medication Administration: Crushed with puree Supervision: Trained caregiver to feed patient Compensations: Slow rate;Small sips/bites Postural Changes and/or Swallow Maneuvers: Seated upright 90 degrees;Upright 30-60 min after meal                Oral Care Recommendations: Oral care QID Follow Up Recommendations: No SLP follow up Assistance recommended at discharge: Frequent or constant Supervision/Assistance SLP Visit Diagnosis: Dysphagia, unspecified (R13.10) Plan: Continue with current plan of care           Osie Bond., M.A. [place] Office [phone]  Secure chat preferred   05/11/2022, 2:42 PM

## 2022-05-12 DIAGNOSIS — K8689 Other specified diseases of pancreas: Secondary | ICD-10-CM | POA: Diagnosis not present

## 2022-05-12 DIAGNOSIS — I2699 Other pulmonary embolism without acute cor pulmonale: Secondary | ICD-10-CM | POA: Diagnosis not present

## 2022-05-12 LAB — CULTURE, BLOOD (ROUTINE X 2)
Culture: NO GROWTH
Culture: NO GROWTH

## 2022-05-12 NOTE — Progress Notes (Signed)
Patient Robert [name]      DOB: August 18, 1932      YQM:[id number]      Palliative Medicine Team    Subjective: Bedside symptom check completed. No family at the bedside during my visit but have been here throughout the day.   Physical exam: Patient resting in bed with eyes closed at time of visit. Breathing even and non-labored, no excessive secretions noted. Patient without physical or non-verbal signs of pain or discomfort at this time. This RN did not attempt to wake patient, to ensure comfort and promote rest.    Assessment and plan: Patient appears very comfortable during my check. Touched base with bedside RN, Quillian Quince, he is without needs or concerns today, plan discussed. Patient remains appropriate to transfer home with hospice support as soon as family and agency are ready to receive him. Will continue to follow for any changes or advances.    Thank you for allowing the Palliative Medicine Team to assist in the care of this patient.     Damian Leavell, MSN, RN Palliative Medicine Team Team Phone: [phone]  This phone is monitored 7a-7p, please reach out to attending physician outside of these hours for urgent needs.

## 2022-05-12 NOTE — Plan of Care (Signed)
  Problem: Nutritional: Goal: Maintenance of adequate nutrition will improve Outcome: Not Progressing Goal: Progress toward achieving an optimal weight will improve Outcome: Not Progressing   Problem: Pain Managment: Goal: General experience of comfort will improve Outcome: Not Progressing

## 2022-05-12 NOTE — Progress Notes (Signed)
[facility])  Referral received for hospice services at home once discharged.  Spoke with daughter Stephanie Coup, provided information and support.  DME discussed, will need a hospital bed and bedside table, [facility] will order this.  Plans to d/c home via [facility] once medically ready to discharge.  Please send completed DNR at discharge.   Thank you, Venia Carbon DNP, RN [facility] Liaison

## 2022-05-12 NOTE — Progress Notes (Addendum)
   CC:  Chief Complaint  Patient presents with   Weakness      Subjective: No issues reported overnight.  Per family, no issues noted; no significant pain; patient requesting to go home.  Objective:  BP 119/74 (BP Location: Right Arm)   Pulse 97   Temp 97.8 F (36.6 C)   Resp 16   Ht '5\' 11"'$  (1.803 m)   Wt 52 kg   SpO2 98%   BMI 15.99 kg/m   General exam: Appears calm and comfortable Respiratory system: Respiratory effort normal.   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[place])   Nodule of upper lobe of right lung   Coronary artery disease involving native coronary artery of native heart without angina pectoris   Type 2 diabetes mellitus without complication, without long-term current use of insulin (HCC)   Chronic diastolic CHF (congestive heart failure) (HCC)   Essential hypertension   Mixed hyperlipidemia due to type 2 diabetes mellitus (HCC)   Bacterial conjunctivitis of both eyes   BPH (benign prostatic hyperplasia)   Pressure injury of skin  Patient is an 88 year old male with history of dementia, hypertension, lipidemia, coronary artery disease, chronic diastolic heart failure, diabetes mellitus type 2, chronic atrial fibrillation/flutter, abdominal aortic aneurysm.  Patient presented secondary to generalized weakness and hypotension in addition to rapid unintentional weight loss.  During hospitalization, patient was found to have evidence of pancreatic mass with associated biliary duct dilation.  Gastroenterology was consulted in addition to palliative care.  Decision was made to transition to comfort measures.  Patient was found to have evidence of bacterial conjunctivitis and was started on Polytrim treatment.  Patient was also found to have evidence of an acute pulmonary embolism and was initially managed on heparin IV  and has transitioned to no treatment at this time.  Plan for discharge home with hospice. -Continue Polytrim -Continue morphine PO/IV as needed -Continue Ativan IV as needed  Cordelia Poche, MD [facility] 05/12/2022, 7:31 AM

## 2022-05-12 NOTE — TOC Transition Note (Signed)
Transition of Care [facility]) - CM/SW Discharge Note   Patient Details  Name: Robert [name] MRN: [id number] Date of Birth: 03/27/1932  [facility]) CM/SW Contact:  Carles Collet, RN Phone Number: 05/12/2022, 10:45 AM   Clinical Narrative:    Consult for home hospice planning. Spoke w patient's daughter at bedside, Robert [name] [phone].  Discussed home hospice, and she was well versed in what home hospice provided thanks to palliative consult, appreciated. Help with decision making on provider and [facility] chosen. Identified that patient will need hospital bed, table and nonemergency transportation to get home. Patient is on room air.  Robert [name] states that a space for the bed will be ready in the home today, and they should be able to accept all DME today. Patient lives at address on file with other daughter, Robert [name] states that all family member will assist in providing 24/7 supervision.  Referral made to Robert Carbon, RN with [facility].     Final next level of care: Home w Hospice Care Barriers to Discharge: No Barriers Identified   Patient Goals and CMS Choice Patient states their goals for this hospitalization and ongoing recovery are:: to go home w hospice care CMS Medicare.gov Compare Post Acute Care list provided to:: Other (Comment Required) Choice offered to / list presented to : Adult Children  Discharge Placement                       Discharge Plan and Services                DME Arranged: Hospital bed DME Agency:  [facility])         [place] Agency: Hospice and [place] Date [place]: 05/12/22 Time [place]: [id number] Representative spoke with at [place]: [place] RN  Social Determinants of Health (SDOH) Interventions     Readmission Risk Interventions     No data to display

## 2022-05-13 ENCOUNTER — Other Ambulatory Visit (HOSPITAL_COMMUNITY): Payer: Self-pay

## 2022-05-13 DIAGNOSIS — K8689 Other specified diseases of pancreas: Secondary | ICD-10-CM | POA: Diagnosis not present

## 2022-05-13 MED ORDER — LORAZEPAM 0.5 MG PO TABS
0.5000 mg | ORAL_TABLET | ORAL | 0 refills | Status: AC | PRN
  Filled 2022-05-13: qty 30, 10d supply, fill #0

## 2022-05-13 MED ORDER — MORPHINE SULFATE (CONCENTRATE) 20 MG/ML PO SOLN
5.0000 mg | ORAL | 0 refills | Status: AC | PRN
  Filled 2022-05-13: qty 10, 7d supply, fill #0

## 2022-05-13 MED ORDER — PROCHLORPERAZINE 25 MG RE SUPP
25.0000 mg | RECTAL | 0 refills | Status: AC | PRN
  Filled 2022-05-13: qty 24, 4d supply, fill #0

## 2022-05-13 NOTE — TOC Progression Note (Addendum)
Transition of Care [facility]) - Progression Note    Patient Details  Name: Robert [name] MRN: [id number] Date of Birth: Mar 08, 1932  Transition of Care [facility]) CM/SW [place], RN Phone Number: 05/13/2022, 9:45 AM  Clinical Narrative:     Daughter here at bedside she is going home now to await equipment. She asked for a recliner, told her they were not DME nad a out of pocket purchase. Messaged Jennifer from hospice , she will let us know when equipment is delivered, so the patient can DC with [facility] 1044 discussion with [facility]. [facility] is used for DME, will be there later this afternoon. Will schedule transportation when DME arrived.     Barriers to Discharge: No Barriers Identified  Expected Discharge Plan and Services                           DME Arranged: Hospital bed DME Agency:  [facility])         [place]: Hospice and [place] Date [place]: 05/12/22 Time [place]: [id number] Representative spoke with at [place]: [place] RN   Social Determinants of Health (SDOH) Interventions    Readmission Risk Interventions     No data to display

## 2022-05-13 NOTE — Progress Notes (Signed)
Patient OT:LXBWIO Auld      DOB: [birth year]      MBT:[id number]      Palliative Medicine Team    Subjective: Bedside symptom check completed. No family or visitors present at time of visit.    Physical exam: Patient resting in bed with eyes closed at time of visit. Breathing even and non-labored, no excessive secretions noted. Patient without physical or non-verbal signs of pain or discomfort at this time. Patient unresponsive this this RN's interaction. All extremities cool to touch with weak palpable radial pulses, unable to palpate pedal pulses. Some increased facial and generalized edema today.    Assessment and plan: Plan to transfer home with hospice today. Patient appears stable for transfer this morning. Touched base with [place] liaison, preparing for discharge. Will continue to follow for any changes or advances.    Thank you for allowing the Palliative Medicine Team to assist in the care of this patient.     Damian Leavell, MSN, RN Palliative Medicine Team Team Phone: [phone]  This phone is monitored 7a-7p, please reach out to attending physician outside of these hours for urgent needs.

## 2022-05-13 NOTE — Progress Notes (Signed)
[facility] ([facility])  DME to be delivered by 4 pm today.  Please keep dtr [place] updated with d/c plans.  [facility] will see patient in the home to begin hospice services.  Venia Carbon DNP, RN [facility] Liaison

## 2022-05-13 NOTE — Progress Notes (Signed)
[facility] here to take pt home.

## 2022-05-13 NOTE — Care Management (Signed)
Equipment has been delivered to house. [facility] called. ETA  in a hour

## 2022-05-13 NOTE — Discharge Summary (Signed)
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 [facility] Course: Patient is an 88 year old male with history of dementia, hypertension, lipidemia, coronary artery disease, chronic diastolic heart failure, diabetes mellitus type 2, chronic atrial fibrillation/flutter, abdominal aortic aneurysm.  Patient presented secondary to generalized weakness and hypotension in addition to rapid unintentional weight loss.  During hospitalization, patient was found to have evidence of pancreatic mass with associated biliary duct dilation.  Gastroenterology was consulted in addition to palliative care.  Decision was made to transition to comfort measures.  Patient was found to have evidence of bacterial conjunctivitis and was started on Polytrim treatment.  Patient was also found to have evidence of an acute pulmonary embolism and was initially managed on heparin IV and has transitioned to no treatment at this time.  Plan for discharge home  with hospice.   Consultants: Gastroenterology, Palliative care medicine Procedures performed: None  Disposition: Hospice care Diet recommendation: Regular  DISCHARGE MEDICATION: Allergies as of 05/13/2022       Reactions   Asa W/codeine [aspirin-codeine] Other (See Comments)   Irritates his water system (urination burning)        Medication List     STOP taking these medications    atorvastatin 10 MG tablet Commonly known as: LIPITOR   clopidogrel 75 MG tablet Commonly known as: PLAVIX   furosemide 40 MG tablet Commonly known as: LASIX   Iron 325 (65 Fe) MG Tabs   lisinopril 20 MG tablet Commonly known as: ZESTRIL   metoprolol tartrate 25 MG tablet Commonly known as: LOPRESSOR   potassium chloride 10 MEQ tablet Commonly known as: KLOR-CON M   tamsulosin 0.4 MG Caps capsule Commonly known as: FLOMAX       TAKE these medications    LORazepam 0.5 MG tablet Commonly known as: ATIVAN Take 1 tablet (0.5 mg total) by mouth every 4 (four) hours as needed for anxiety. May crush, mix with water and give sublingually if needed.   morphine 20 MG/ML concentrated solution Commonly known as: ROXANOL Take 0.25 mLs (5 mg total) by mouth every 4 (four) hours as needed for severe pain or shortness of breath. May give sublingually if needed.   prochlorperazine 25 MG suppository Commonly known as: COMPAZINE Place 1 suppository (25 mg total) rectally every 4 (four) hours as needed for nausea or vomiting.        Discharge Exam: BP 105/72 (BP Location: Right Arm)   Pulse (!) 117   Temp 97.8 F (36.6 C)   Resp 18  Ht '5\' 11"'$  (1.803 m)   Wt 52 kg   SpO2 97%   BMI 15.99 kg/m   General exam: Appears calm and comfortable  Respiratory system: Clear to auscultation. Respiratory effort normal.  Condition at discharge: Comfort measures  The results of significant diagnostics from this hospitalization (including imaging, microbiology, ancillary and laboratory) are  listed below for reference.   Imaging Studies: ECHOCARDIOGRAM COMPLETE  Result Date: 05/07/2022    ECHOCARDIOGRAM REPORT   Patient Name:   Robert [name] Date of Exam: 05/07/2022 Medical Rec #:  [id number]      Height:       71.0 in Accession #:    [id number]     Weight:       129.8 lb Date of Birth:  Mar 23, 1932      BSA:          1.755 m Patient Age:    74 years       BP:           128/75 mmHg Patient Gender: M              HR:           122 bpm. Exam Location:  Inpatient Procedure: 2D Echo, Color Doppler and Cardiac Doppler Indications:    Pulmonary embolus  History:        Patient has prior history of Echocardiogram examinations, most                 recent 09/01/2017. Signs/Symptoms:Altered Mental Status.  Sonographer:    Merrie Roof RDCS Referring Phys: [id number] [place]  Sonographer Comments: Technically difficult study due to poor echo windows, suboptimal parasternal window, suboptimal apical window and suboptimal subcostal window. IMPRESSIONS  1. Left ventricular ejection fraction, by estimation, is 30 to 35%. The left ventricle has moderately decreased function. The left ventricle demonstrates global hypokinesis. Left ventricular diastolic parameters are indeterminate.  2. Right ventricular systolic function is normal. The right ventricular size is normal.  3. The mitral valve is normal in structure. Moderate to severe mitral valve regurgitation. No evidence of mitral stenosis.  4. The aortic valve is tricuspid. There is mild calcification of the aortic valve. There is mild thickening of the aortic valve. Aortic valve regurgitation is moderate. Aortic valve sclerosis/calcification is present, without any evidence of aortic stenosis.  5. The inferior vena cava is normal in size with greater than 50% respiratory variability, suggesting right atrial pressure of 3 mmHg. Comparison(s): Prior images reviewed side by side. The left ventricular function is worsened. FINDINGS  Left Ventricle: Left  ventricular ejection fraction, by estimation, is 30 to 35%. The left ventricle has moderately decreased function. The left ventricle demonstrates global hypokinesis. The left ventricular internal cavity size was normal in size. There is no left ventricular hypertrophy. Left ventricular diastolic parameters are indeterminate. Right Ventricle: The right ventricular size is normal. No increase in right ventricular wall thickness. Right ventricular systolic function is normal. Left Atrium: Left atrial size was normal in size. Right Atrium: Right atrial size was normal in size. Pericardium: There is no evidence of pericardial effusion. Mitral Valve: The mitral valve is normal in structure. Moderate to severe mitral valve regurgitation. No evidence of mitral valve stenosis. Tricuspid Valve: The tricuspid valve is normal in structure. Tricuspid valve regurgitation is not demonstrated. No evidence of tricuspid stenosis. Aortic Valve: The aortic valve is tricuspid. There is mild calcification of the aortic valve. There is mild thickening of the aortic valve. Aortic valve  regurgitation is moderate. Aortic valve sclerosis/calcification is present, without any evidence of aortic stenosis. Pulmonic Valve: The pulmonic valve was normal in structure. Pulmonic valve regurgitation is mild. No evidence of pulmonic stenosis. Aorta: The aortic root is normal in size and structure. Venous: The inferior vena cava is normal in size with greater than 50% respiratory variability, suggesting right atrial pressure of 3 mmHg. IAS/Shunts: No atrial level shunt detected by color flow Doppler.  LEFT VENTRICLE PLAX 2D LVIDd:         5.10 cm LV PW:         0.90 cm LV IVS:        1.00 cm LVOT diam:     2.20 cm LVOT Area:     3.80 cm  LEFT ATRIUM         Index LA diam:    4.10 cm 2.34 cm/m   AORTA Ao Root diam: 3.80 cm  SHUNTS Systemic Diam: 2.20 cm Candee Furbish MD Electronically signed by Candee Furbish MD Signature Date/Time: 05/07/2022/4:07:44 PM     Final    MR ABDOMEN MRCP W WO CONTAST  Result Date: 05/07/2022 CLINICAL DATA:  88 year old male with history of jaundice. EXAM: MRI ABDOMEN WITHOUT AND WITH CONTRAST (INCLUDING MRCP) TECHNIQUE: Multiplanar multisequence MR imaging of the abdomen was performed both before and after the administration of intravenous contrast. Heavily T2-weighted images of the biliary and pancreatic ducts were obtained, and three-dimensional MRCP images were rendered by post processing. CONTRAST:  51m GADAVIST GADOBUTROL 1 MMOL/ML IV SOLN COMPARISON:  No prior abdominal MRI. CT the abdomen and pelvis 05/06/2022. FINDINGS: Comment: Today's study is exceedingly limited by extensive patient respiratory motion and suboptimal contrast bolus (the majority of the postcontrast images demonstrate no intravascular gadolinium). Lower chest: Increased susceptibility artifact in the dependent portions of the lower thorax bilaterally corresponding to extensive bullous disease in this region on recent chest CT. Small right pleural effusion. Hepatobiliary: Severely limited assessment secondary to suboptimal contrast bolus and patient respiratory motion. With these limitations in mind, there is severe intrahepatic biliary ductal dilatation and diffuse periportal edema. Several well-defined T1 hypointense, T2 hyperintense, nonenhancing lesions scattered throughout the liver correspond to multiple cysts noted on prior CT examinations, measuring up to 3.3 x 2.8 cm in segment 8 (axial image 6 of series 4). No clearly aggressive intrahepatic lesion confidently identified within the limitations of today's examination. Gallbladder is severely distended. In the dependent portion of the gallbladder there is some T1 hyperintense, T2 hypointense amorphous material lying dependently, most likely to represent biliary sludge. Gallbladder wall thickness is upper limits of normal at 3 mm. Trace amount of pericholecystic fluid, nonspecific in the setting of trace  volume of ascites. Cystic duct is dilated. Shortly after the confluence of the common hepatic duct and the cystic duct there is some focal narrowing of the proximal common bile duct, which appears to demonstrate some peripheral enhancement best appreciated on axial image 47 of series 19. Slightly more distal to this best appreciated on coronal MRCP image 29 of series 11 there is irregularity in and around the common bile duct, resulting in complete obliteration of the lumen. Whether this is secondary to extrinsic or intrinsic obstruction is uncertain on today's limited study. Pancreas: Severe dilatation of the main pancreatic duct and many pancreatic ductal side branches, with the main pancreatic duct measuring up to 1.8 cm in the neck of the pancreas. Multiple cystic appearing regions in the head of the pancreas appear to predominantly represent dilated side  branches, however, the pancreatic head appears enlarged relative to the rest of the pancreas which is otherwise severely atrophic, and in the region of the common bile duct obliteration in the pancreatic head (axial image 57 of series 19) this mass-like appearance has multiple internal cystic regions and relatively increased parenchymal enhancement, estimated to measure approximately 5.3 x 4.9 cm. Whether there is truly a lesion in this region or not is uncertain on today's limited study, however, the coincident obliteration of the common bile duct and main pancreatic duct in these regions, with severe proximal dilatation raises concern for neoplasm. Spleen:  Unremarkable. Adrenals/Urinary Tract: Multiple T1 hypointense, T2 hyperintense, nonenhancing lesions in both kidneys, compatible with simple cysts, largest of which is exophytic in the lateral aspect of the interpolar region of the left kidney measuring up to 7.3 cm in diameter. No hydroureteronephrosis in the visualized portions of the abdomen. Bilateral adrenal glands are normal in appearance.  Stomach/Bowel: Visualized portions are unremarkable. Vascular/Lymphatic: Aorto bi-iliac stent graft noted, apparently patent on today's limited examination. Multiple prominent borderline enlarged peripancreatic lymph nodes adjacent to the pancreatic head, measuring up to 9 mm (axial image 54 of series 19). Other:  Trace volume of ascites. Musculoskeletal: No aggressive appearing osseous lesions are noted in the visualized portions of the skeleton. IMPRESSION: 1. Severely limited examination which is highly suspicious for a complex cystic and solid lesion in the pancreatic head resulting in obstruction of the distal common bile duct and pancreatic ducts, with severe proximal ductal dilatation. Small peripancreatic lymph nodes suspicious for potential nodal metastasis. Further evaluation with endoscopic ultrasound and/or ERCP is recommended to better evaluate these findings. 2. Biliary sludge lying dependently in the gallbladder. Gallbladder is severely distended. Gallbladder wall thickness appears upper limits of normal, and there is a trace volume of pericholecystic fluid (assessment is limited by presence of small volume of ascites) such that the possibility of early acute cholecystitis is not excluded. 3. Severe bullous emphysema in the lung bases. Clinical correlation for evidence of alpha-1 antitrypsin deficiency is recommended. 4. Additional incidental findings, as above. Electronically Signed   By: Vinnie Langton M.D.   On: 05/07/2022 05:48   CT ABDOMEN PELVIS W CONTRAST  Result Date: 05/06/2022 CLINICAL DATA:  Pulmonary nodule, metastatic disease EXAM: CT ABDOMEN AND PELVIS WITH CONTRAST TECHNIQUE: Multidetector CT imaging of the abdomen and pelvis was performed using the standard protocol following bolus administration of intravenous contrast. RADIATION DOSE REDUCTION: This exam was performed according to the departmental dose-optimization program which includes automated exposure control, adjustment  of the mA and/or kV according to patient size and/or use of iterative reconstruction technique. CONTRAST:  17m OMNIPAQUE IOHEXOL 300 MG/ML  SOLN COMPARISON:  04/01/2021 FINDINGS: Lower chest: Prominent bulla are noted within the lung bases bilaterally better assessed on previously performed chest CT at 6:20 p.m. Patchy ground-glass pulmonary infiltrates are noted within the visualized lung bases, possibly infectious or inflammatory in nature. Extensive multi-vessel coronary artery calcification. Cardiac size is within normal limits. Hepatobiliary: There is interval development of marked intra and extrahepatic biliary ductal dilation to the level of the ampulla where the extrahepatic bile duct is obliterated, best seen on image # 37/3 and 63/5. The gallbladder is markedly distended in keeping with an obstructive biliary process. Multiple hepatic cysts are again identified. No enhancing intrahepatic mass identified. Pancreas: The main pancreatic duct is markedly dilated, new since prior examination. The duct also appears occluded in the region of the ampulla, best seen on axial image # 39/3  and 61/5. Multiple varicoid cystic lesions in this region of the pancreatic head may represent dilated pancreatic side branches. A discrete mass is not clearly identified on this limited examination, however. Spleen: Unremarkable Adrenals/Urinary Tract: The adrenal glands are unremarkable. The kidneys are normal in size and position. Multiple simple cortical cysts are seen within the kidneys bilaterally. No further follow-up is recommended for these lesions there is no hydronephrosis. Contrast from prior chest CT examination is seen within the renal collecting system. The bladder is unremarkable. Stomach/Bowel: Evaluation of the bowel is markedly limited by lack of intra-abdominal fat, diffuse anasarca, and phase of contrast enhancement. The large and small bowel are grossly unremarkable. No evidence of obstruction. No free  intraperitoneal gas or fluid. Vascular/Lymphatic: Abdominal aortic aneurysm pair utilizing a bifurcated stent graft is again identified. No residual abdominal aortic aneurysm. Extensive aortoiliac atherosclerotic calcification. Evaluation of the retroperitoneal soft tissues is markedly limited by extensive retroperitoneal edema though no definite pathologic adenopathy is identified. Reproductive: The prostate gland is unremarkable. Other: There is diffuse body wall wasting and edema in keeping with anasarca. Musculoskeletal: No acute bone abnormality. Degenerative changes are seen within the lumbar spine. Left total hip arthroplasty has been performed. IMPRESSION: 1. Interval development of marked intra and extrahepatic biliary ductal dilation to the level of the ampulla where the extrahepatic bile duct is obliterated. Interval development of marked dilation of the main pancreatic duct. Multiple varicoid cystic lesions in the region of the pancreatic head may represent dilated pancreatic side branches. A discrete mass is not clearly identified on this limited examination. Correlation with EUS/ERCP or MRI/MRCP examination may be helpful for further evaluation. 2. Patchy ground-glass pulmonary infiltrates within the visualized lung bases, possibly infectious or inflammatory in nature. 3. Diffuse body wall wasting and edema in keeping with anasarca. Extensive body wall and retroperitoneal edema limits evaluation of the a intraperitoneal and retroperitoneal structures. 4. Extensive multi-vessel coronary artery calcification. 5. Abdominal aortic aneurysm utilizing a bifurcated stent graft. No residual abdominal aortic aneurysm. Electronically Signed   By: Fidela Salisbury M.D.   On: 05/06/2022 21:09   CT Angio Chest PE W and/or Wo Contrast  Addendum Date: 05/06/2022   ADDENDUM REPORT: 05/06/2022 19:22 ADDENDUM: Additional IMPRESSION: 8. Prominent soft tissue in the region of the thymus. Findings may represent thymic  hyperplasia or other soft tissue nodule. Recommend clinical correlation and follow-up. Electronically Signed   By: Ronney Asters M.D.   On: 05/06/2022 19:22   Result Date: 05/06/2022 CLINICAL DATA:  High probability for PE.  Concern for mass. EXAM: CT ANGIOGRAPHY CHEST WITH CONTRAST TECHNIQUE: Multidetector CT imaging of the chest was performed using the standard protocol during bolus administration of intravenous contrast. Multiplanar CT image reconstructions and MIPs were obtained to evaluate the vascular anatomy. RADIATION DOSE REDUCTION: This exam was performed according to the departmental dose-optimization program which includes automated exposure control, adjustment of the mA and/or kV according to patient size and/or use of iterative reconstruction technique. CONTRAST:  88m OMNIPAQUE IOHEXOL 350 MG/ML SOLN COMPARISON:  CT PE protocol 02/15/2012 FINDINGS: Cardiovascular: There is aneurysmal dilatation of the ascending aorta measuring 4.1 cm which is unchanged from prior. Heart is mildly enlarged. There is no pericardial effusion. There is adequate opacification of the pulmonary arteries. Pulmonary emboli are seen within the distal right main pulmonary artery extending into lobar and segmental branches of the right lower lobe and right middle lobe. No central or saddle pulmonary embolism identified. Mediastinum/Nodes: Visualized thyroid gland is within normal limits. There  is prominent soft tissue in the anterior mediastinum in the region of the thymus measuring up to 16 mm in thickness. No other enlarged lymph nodes are identified. Esophagus is unremarkable. Lungs/Pleura: Severe emphysematous changes have progressed compared to 2013. There are new large bullae within the bilateral lower lobes measuring up to 8 cm on the right and 7 cm on the left. There is adjacent compressive atelectasis. Fibrotic changes are seen in the lung bases, increased from prior. There is stable scarring in both lung apices. New  focal ill-defined peripheral airspace opacity in the right upper lobe with slightly nodular component measuring 1.5 by 1.0 by 1.2 cm. Multifocal minimal ground-glass opacity seen in the right middle lobe, new from prior. Trachea and central airways are patent. Upper Abdomen: There are innumerable hypodense structures throughout the liver similar to the prior study, most likely cysts. Bilateral renal cysts are partially visualized. There are surgical clips in the upper abdomen. Musculoskeletal: No chest wall abnormality. No acute or significant osseous findings. Review of the MIP images confirms the above findings. IMPRESSION: 1. Pulmonary emboli within the distal right main pulmonary artery extending into lobar and segmental branches of the right lower lobe and right middle lobe. No evidence for right heart strain. 2. Patchy ground-glass opacities in the right middle lobe, likely infectious/inflammatory. 3. Progression of severe emphysema with large bullae in both lower lobes. 4. Ill-defined peripheral focal opacity in the right upper lobe, slightly nodular, measuring up to 15 mm. This is indeterminate. Consider one of the following in 3 months for both low-risk and high-risk individuals: (a) repeat chest CT, (b) follow-up PET-CT, or (c) tissue sampling. This recommendation follows the consensus statement: Guidelines for Management of Incidental Pulmonary Nodules Detected on CT Images: From the Fleischner Society 2017; Radiology 2017; 284:228-243. 5. Progression of fibrotic changes in the lung bases. 6. Cardiomegaly. 7. Aneurysmal dilatation of the ascending aorta measuring 4.1 cm. Recommend annual imaging followup by CTA or MRA. This recommendation follows 2010 ACCF/AHA/AATS/ACR/ASA/SCA/SCAI/SIR/STS/SVM Guidelines for the Diagnosis and Management of Patients with Thoracic Aortic Disease. Circulation. 2010; 121: V400-Q676. Aortic aneurysm NOS (ICD10-I71.9) Aortic Atherosclerosis (ICD10-I70.0) and Emphysema  (ICD10-J43.9). Electronically Signed: By: Ronney Asters M.D. On: 05/06/2022 19:06   DG Chest 2 View  Result Date: 05/06/2022 CLINICAL DATA:  Pneumonia. EXAM: CHEST - 2 VIEW COMPARISON:  Chest x-ray 04/01/2021 FINDINGS: Aorta is ectatic, unchanged. Heart size is mildly enlarged, unchanged. There is some patchy opacities in the left lower lung with possible new bulla. There is blunting of the right costophrenic angle. No pleural effusion. Reticulonodular opacities are seen in the lateral right upper lobe. No evidence for pneumothorax. No acute fractures. IMPRESSION: 1. Left lower lobe airspace disease with possible new bullae. 2. Reticulonodular opacities in the right upper lobe, indeterminate. Findings may be infectious/inflammatory. 3. Consider follow-up CT for further evaluation. Electronically Signed   By: Ronney Asters M.D.   On: 05/06/2022 16:14    Microbiology: Results for orders placed or performed during the hospital encounter of 05/06/22  Culture, blood (Routine X 2) w Reflex to ID Panel     Status: None   Collection Time: 05/07/22  6:20 AM   Specimen: BLOOD RIGHT HAND  Result Value Ref Range Status   Specimen Description BLOOD RIGHT HAND  Final   Special Requests   Final    AEROBIC BOTTLE ONLY Blood Culture results may not be optimal due to an inadequate volume of blood received in culture bottles   Culture   Final  NO GROWTH 5 DAYS Performed at [facility], [place] [address] [place], [place] [address]    Report Status 05/12/2022 FINAL  Final  Culture, blood (Routine X 2) w Reflex to ID Panel     Status: None   Collection Time: 05/07/22  6:25 AM   Specimen: BLOOD LEFT HAND  Result Value Ref Range Status   Specimen Description BLOOD LEFT HAND  Final   Special Requests   Final    AEROBIC BOTTLE ONLY Blood Culture results may not be optimal due to an inadequate volume of blood received in culture bottles   Culture   Final    NO GROWTH 5 DAYS Performed at [place], [place] [address] [place], [place] [address]    Report Status 05/12/2022 FINAL  Final    Labs: CBC: Recent Labs  Lab 05/07/22 0519 05/08/22 0419 05/09/22 0148 05/10/22 0611 05/11/22 0214  WBC 4.8 4.6 4.4 4.6 3.6*  NEUTROABS 4.1  --   --   --   --   HGB 12.0* 12.4* 12.5* 14.1 12.4*  HCT 36.2* 36.9* 37.7* 43.0 36.7*  MCV 92.1 92.3 92.2 92.7 91.8  PLT 148* 162 153 156 144*   Basic Metabolic Panel: Recent Labs  Lab 05/07/22 0519 05/08/22 0419 05/08/22 1235 05/09/22 0554 05/10/22 0611 05/11/22 0214  NA 137 140  --  140 143 145  K 3.1* 3.4* 3.7 3.8 4.0 3.4*  CL 105 107  --  107 108 111  CO2 23 21*  --  22 19* 18*  GLUCOSE 99 117*  --  147* 113* 97  BUN 16 13  --  '10 9 8  '$ CREATININE 1.02 0.84  --  0.84 0.72 0.72  CALCIUM 8.2* 8.2*  --  8.3* 8.7* 8.5*  MG 2.3  --   --   --   --   --    Liver Function Tests: Recent Labs  Lab 05/07/22 0519 05/08/22 0419 05/09/22 0554 05/10/22 0611 05/11/22 0214  AST 67* 64* 51* 59* 51*  ALT 64* 63* 58* 60* 57*  ALKPHOS 383* 345* 315* 314* 260*  BILITOT 10.8* 9.7* 8.6* 8.7* 7.2*  PROT 6.0* 5.9* 5.7* 6.2* 5.6*  ALBUMIN 2.2* 2.0* 1.9* 2.0* 1.8*   CBG: Recent Labs  Lab 05/10/22 2022 05/10/22 2358 05/11/22 0421 05/11/22 0803 05/11/22 1233  GLUCAP 116* 92 79 118* 130*    Discharge time spent: 35 minutes.  Signed: Cordelia Poche, MD [facility] 05/13/2022

## 2022-05-13 NOTE — Progress Notes (Signed)
Pt appears comfortable, awaiting [facility] for transport. Gold DNR form signed.

## 2022-05-25 DEATH — deceased

## 2022-05-27 ENCOUNTER — Ambulatory Visit: Payer: PPO | Admitting: Podiatry

## 2022-06-11 ENCOUNTER — Other Ambulatory Visit (HOSPITAL_COMMUNITY): Payer: Self-pay
